# Patient Record
Sex: Female | Born: 1990 | Race: Black or African American | Hispanic: No | Marital: Single | Smoking: Never smoker
Health system: Southern US, Community
[De-identification: ages and names within clinical notes are randomized; demographics above are authoritative.]

## PROBLEM LIST (undated history)

## (undated) DIAGNOSIS — D649 Anemia, unspecified: Secondary | ICD-10-CM

## (undated) DIAGNOSIS — Z8619 Personal history of other infectious and parasitic diseases: Secondary | ICD-10-CM

## (undated) DIAGNOSIS — B379 Candidiasis, unspecified: Secondary | ICD-10-CM

## (undated) DIAGNOSIS — I456 Pre-excitation syndrome: Secondary | ICD-10-CM

## (undated) DIAGNOSIS — R638 Other symptoms and signs concerning food and fluid intake: Secondary | ICD-10-CM

## (undated) HISTORY — PX: TONSILLECTOMY: SUR1361

## (undated) HISTORY — DX: Other symptoms and signs concerning food and fluid intake: R63.8

## (undated) HISTORY — DX: Personal history of other infectious and parasitic diseases: Z86.19

## (undated) HISTORY — PX: MYRINGOTOMY: SUR874

## (undated) HISTORY — PX: CARDIAC ELECTROPHYSIOLOGY STUDY AND ABLATION: SHX1294

## (undated) HISTORY — DX: Anemia, unspecified: D64.9

## (undated) HISTORY — DX: Candidiasis, unspecified: B37.9

---
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
## 2012-02-24 ENCOUNTER — Encounter: Payer: Self-pay | Admitting: Obstetrics and Gynecology

## 2012-03-09 ENCOUNTER — Ambulatory Visit (INDEPENDENT_AMBULATORY_CARE_PROVIDER_SITE_OTHER): Payer: BC Managed Care – PPO | Admitting: Obstetrics and Gynecology

## 2012-03-09 ENCOUNTER — Encounter: Payer: Self-pay | Admitting: Obstetrics and Gynecology

## 2012-03-09 VITALS — BP 132/70 | Temp 98.6°F | Wt 205.0 lb

## 2012-03-09 DIAGNOSIS — R82998 Other abnormal findings in urine: Secondary | ICD-10-CM

## 2012-03-09 DIAGNOSIS — Z2089 Contact with and (suspected) exposure to other communicable diseases: Secondary | ICD-10-CM

## 2012-03-09 DIAGNOSIS — R8290 Unspecified abnormal findings in urine: Secondary | ICD-10-CM

## 2012-03-09 DIAGNOSIS — N906 Unspecified hypertrophy of vulva: Secondary | ICD-10-CM

## 2012-03-09 DIAGNOSIS — N946 Dysmenorrhea, unspecified: Secondary | ICD-10-CM

## 2012-03-09 DIAGNOSIS — Z202 Contact with and (suspected) exposure to infections with a predominantly sexual mode of transmission: Secondary | ICD-10-CM

## 2012-03-09 DIAGNOSIS — N926 Irregular menstruation, unspecified: Secondary | ICD-10-CM

## 2012-03-09 LAB — POCT URINALYSIS DIPSTICK
Bilirubin, UA: NEGATIVE
Glucose, UA: NEGATIVE
Ketones, UA: NEGATIVE
Leukocytes, UA: NEGATIVE
Nitrite, UA: NEGATIVE
Protein, UA: NEGATIVE
Spec Grav, UA: <=1.005
Urobilinogen, UA: NEGATIVE
pH, UA: 7

## 2012-03-09 NOTE — Progress Notes (Signed)
GYN PROBLEM VISIT  [name] is a 21 y.o. year old female,G0P0000, who presents for a problem visit. Historians are pt and her mother.  Pt seems to have difficulty remembering some facts. 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 Pt has noticed,"these extra folds of skin since teen year". No irritation or pain at the area. Pt c/o "vaginal bumps" that come and go; occ accompanied by pain. No fever.  Has difficulty cleaning after urination with toilet tissue getting stuck in the folds.  She 's concerned that she is unable to keep the vulvar area clean with menses.  The mother does complain of an offensive odor after the patient has urinated in the bathroom.  The patient denies dysuria, urgency, or urinary frequency. Mother complains that patient's menses are heavy and difficult to maage .  The patient states that she has regular monthly menses that last for as much as 2 weeks, however on further questioning her menses actually last about 5 days.  She does report soiling close and sheets.  She denies sexual activity or need for contraception.    Objective:  BP 132/70  Temp 98.6 F (37 C)  Wt 205 lb (92.987 kg)  LMP 02/24/2012   Abdomen: protruding with Exam  limited by body habitus.  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  Rule out urinary tract infection Labial hypertrophy causing difficulty with hygeine Menorrhagia  Dysmenorrhea  Plan: Urine culture and sensitivity Patient request surgery for reduction of labial hypertrophy.  The indications, risks, and benefits of this procedure were reviewed.  The risks of anesthesia bleeding infection and damage to adjacent organs is reviewed.  A  specific risk of vulvar nerve damage causing chronic vulvar pain is reviewed.  It is recommended that the patient be out of school for 2 weeks for day surgery and she will plan to have during her winter break.  She will call back with those dates so that it can be scheduled. Options for management of menorrhagia with contraception were reviewed and written information given.  The patient will review that information and call with her choice of method.   Return to office prn if symptoms worsen or fail to improve.   Dierdre Forth, MD  03/09/2012 6:27 PM

## 2012-03-09 NOTE — Patient Instructions (Signed)
Contraception Choices can also help make your periods more regular and lighter Birth control (contraception) can stop pregnancy from happening. Different types of birth control work in different ways. Some can:  Make the mucus in the cervix thick. This makes it hard for sperm to get into the uterus.   Thin the lining of the uterus. This makes it hard for an egg to attach to the wall of the uterus.   Stop the ovaries from releasing an egg.   Block the sperm from reaching the egg.  Certain types of surgery can stop pregnancy from happening. For women, the sugery closes the fallopian tubes (tubal ligation). For men, the surgery stops sperm from releasing during sex (vasectomy). HORMONAL BIRTH CONTROL Hormonal birth control stops pregnancy by putting hormones into your body. Types of birth control include:  A small tube put under the skin of the upper arm (implant). The tube can stay in place for 3 years.   Shots given every 3 months.   Pills taken every day or once after sex (intercourse).   Patches that are changed once a week.   A ring put into the vagina (vaginal ring). The ring is left in place for 3 weeks and removed for 1 week. Then, a new ring is put in the vagina.  BARRIER BIRTH CONTROL   (this will not help your period be lighter or more regular) Barrier birth control blocks sperm from reaching the egg. Types of birth control include:   A thin covering worn on the penis (female condom) during sex.   A soft, loose covering put into the vagina (female condom) before sex.   A rubber bowl that sits over the cervix (diaphragm). The bowl must be made for you. The bowl is put into the vagina before sex. The bowl is left in place for 6 to 8 hours after sex.   A small, soft cup that fits over the cervix (cervical cap). The cup must be made for you. The cup can be left in place for 48 hours after sex.   A sponge that is put into the vagina before sex.   A chemical that kills or blocks  sperm from getting into the cervix and uterus (spermicide). The chemical may be a cream, jelly, foam, or pill.  INTRAUTERINE (IUD) BIRTH CONTROL  IUD birth control is a small, T-shaped piece of plastic. The plastic is put inside the uterus. There are 2 types of IUD:  Copper IUD. The IUD is covered in copper wire. The copper makes a fluid that kills sperm. It can stay in place for 10 years.   Hormone IUD. The hormone stops pregnancy from happening. It can stay in place for 5 years.  Document Released: 04/25/2009 Document Revised: 06/17/2011 Document Reviewed: 11/04/2010 [facility] Patient Information 2012 [place], [place].

## 2012-03-12 LAB — URINE CULTURE: Colony Count: 30000

## 2012-04-27 ENCOUNTER — Telehealth: Payer: Self-pay | Admitting: Obstetrics and Gynecology

## 2012-09-18 ENCOUNTER — Telehealth: Payer: Self-pay | Admitting: Obstetrics and Gynecology

## 2012-09-18 NOTE — Telephone Encounter (Signed)
TC to patient to follow up on an old order for surgery, LM for patient to [facility] to [phone]. -ap

## 2012-09-20 ENCOUNTER — Telehealth: Payer: Self-pay | Admitting: Obstetrics and Gynecology

## 2012-09-20 NOTE — Telephone Encounter (Signed)
RC from patient - States she is not ready to proceed with outpatient surgery.  She will call back when ready. -ap

## 2015-09-04 ENCOUNTER — Ambulatory Visit: Payer: Self-pay | Admitting: Gynecology

## 2015-09-15 ENCOUNTER — Ambulatory Visit: Payer: Self-pay | Admitting: Gynecology

## 2015-10-02 ENCOUNTER — Ambulatory Visit (INDEPENDENT_AMBULATORY_CARE_PROVIDER_SITE_OTHER): Payer: PRIVATE HEALTH INSURANCE | Admitting: Gynecology

## 2015-10-02 ENCOUNTER — Encounter: Payer: Self-pay | Admitting: Gynecology

## 2015-10-02 VITALS — BP 122/76 | Ht 66.0 in | Wt 206.0 lb

## 2015-10-02 DIAGNOSIS — IMO0002 Reserved for concepts with insufficient information to code with codable children: Secondary | ICD-10-CM

## 2015-10-02 DIAGNOSIS — R87810 Cervical high risk human papillomavirus (HPV) DNA test positive: Secondary | ICD-10-CM

## 2015-10-02 DIAGNOSIS — R896 Abnormal cytological findings in specimens from other organs, systems and tissues: Secondary | ICD-10-CM

## 2015-10-02 NOTE — Patient Instructions (Signed)
Follow up for repeat Pap smear when you're due for your next annual exam.

## 2015-10-02 NOTE — Progress Notes (Signed)
    Jeanell Sparrowavia N Kester 02/25/1991 [id number]        25 y.o.  G0P0000 new patient presents in [facility]. 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  Past medical history,surgical history, problem list, medications, allergies, family history and social history were all reviewed and documented in the EPIC chart.  Directed ROS with pertinent positives and negatives documented in the history of present illness/assessment and plan.  Exam: Kennon PortelaKim Gardner assistant Filed Vitals:   10/02/15 1401  BP: 122/76  Height: 5\' 6"  (1.676 m)  Weight: 206 lb (93.441 kg)   General appearance:  Normal Abdomen soft nontender without masses guarding rebound Pelvic external BUS vagina grossly normal with prominent minora bilaterally as always. Cervix grossly normal. Uterus normal size midline mobile nontender. Adnexa without masses or tenderness.  Colposcopy performed after acetic acid cleanse adequate with ectropion and no abnormalities noted. No biopsies taken. Physical Exam  Genitourinary:       Assessment/Plan:  25 y.o. 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 Colposcopy is adequate normal noting ectropion and clear easy visualization of the transformation zone. No biopsies taken. I reviewed with the patient and her mother the whole issue of dysplasia, high-grade/low-grade, progression/regression and the HPV association. Recommend expectant management for now with repeat Pap smear/HPV in 1 year. [facility]    Dara LordsFONTAINE,Golden Emile P MD, 2:37 PM 10/02/2015

## 2015-12-11 ENCOUNTER — Ambulatory Visit (INDEPENDENT_AMBULATORY_CARE_PROVIDER_SITE_OTHER): Payer: PRIVATE HEALTH INSURANCE | Admitting: Sports Medicine

## 2015-12-11 ENCOUNTER — Encounter: Payer: Self-pay | Admitting: Sports Medicine

## 2015-12-11 DIAGNOSIS — M79672 Pain in left foot: Secondary | ICD-10-CM | POA: Diagnosis not present

## 2015-12-11 DIAGNOSIS — Q828 Other specified congenital malformations of skin: Secondary | ICD-10-CM | POA: Diagnosis not present

## 2015-12-11 DIAGNOSIS — M2042 Other hammer toe(s) (acquired), left foot: Secondary | ICD-10-CM | POA: Diagnosis not present

## 2015-12-11 NOTE — Progress Notes (Signed)
Patient ID: Allison Jones, female   DOB: 1990-09-19, 25 y.o.   MRN: [id number] Subjective: Allison Jones is a 25 y.o. female patient who presents to office for evaluation of Left foot pain secondary to callus skin; thinks is a possible wart. Patient complains of pain at the lesion present Left foot at the ball for over one month. Patient has tried aspirin with no relief in symptoms. Patient denies any other pedal complaints.   Patient Active Problem List   Diagnosis Date Noted  . Dysmenorrhea 03/09/2012  . Irregular menses 03/09/2012  . Abnormal urine odor 03/09/2012  . 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 IRON PO Take by mouth.    Marland Kitchen PRESCRIPTION MEDICATION Birth control pill  ?name     No current facility-administered medications on file prior to visit.    No Known Allergies  Objective:  General: Alert and oriented x3 in no acute distress  Dermatology: Keratotic lesion present Sub-met 2, left foot with skin lines transversing the lesion, pain is present with direct pressure to the lesion with a central nucleated core noted consistent with porokeratosis, no webspace macerations, no ecchymosis bilateral, all nails x 10 are well manicured.  Vascular: Dorsalis Pedis and Posterior Tibial pedal pulses 2/4, Capillary Fill Time 3 seconds, + pedal hair growth bilateral, no edema bilateral lower extremities, Temperature gradient within normal limits.  Neurology: Johney Maine sensation intact via light touch bilateral.  Musculoskeletal: Mild tenderness with palpation at the keratotic lesion site on Left, Muscular strength 5/5 in all groups without pain or limitation on range of motion. Significant hammertoe deformity 1 through 5, left greater than right, rigid in nature with plantarflex metatarsal head.  Assessment and Plan: Problem List Items Addressed This Visit    None    Visit Diagnoses    Porokeratosis    -  Primary    Hammer toe of left foot        Left foot pain          -Complete examination performed -Discussed treatment options for porokeratosis, likely secondary to digital deformity; encouraged patient to consider surgical correction of digital deformities after possible neurologic workup. Patient has a past history of toe walking that is concerning for neurological component of hammertoe. -Parred keratoic lesion using a chisel blade; treated the area with Salinocaine covered with moleskin -Gave offloading pads for use as needed to ball of left foot -Encouraged daily skin emollients -Encouraged use of pumice stone -Advised good supportive shoes and inserts -Patient to return to office as needed or sooner if condition worsens.  Allison Jones, DPM

## 2016-01-08 ENCOUNTER — Ambulatory Visit (INDEPENDENT_AMBULATORY_CARE_PROVIDER_SITE_OTHER): Payer: PRIVATE HEALTH INSURANCE | Admitting: Sports Medicine

## 2016-01-08 ENCOUNTER — Encounter: Payer: Self-pay | Admitting: Sports Medicine

## 2016-01-08 DIAGNOSIS — Q828 Other specified congenital malformations of skin: Secondary | ICD-10-CM | POA: Diagnosis not present

## 2016-01-08 DIAGNOSIS — M79672 Pain in left foot: Secondary | ICD-10-CM

## 2016-01-08 DIAGNOSIS — M2042 Other hammer toe(s) (acquired), left foot: Secondary | ICD-10-CM | POA: Diagnosis not present

## 2016-01-08 DIAGNOSIS — B359 Dermatophytosis, unspecified: Secondary | ICD-10-CM

## 2016-01-08 MED ORDER — CLOTRIMAZOLE 1 % EX SOLN: 1.0000 "application " | Freq: Two times a day (BID) | CUTANEOUS | Status: DC

## 2016-01-08 NOTE — Progress Notes (Signed)
Patient ID: Allison Jones, female   DOB: August 14, 1990, 25 y.o.   MRN: [id number]  Subjective: Allison Jones is a 25 y.o. female patient who returns to office for evaluation of Left foot pain secondary to callus skin. Patient complains of pain at the lesion present Left foot at the ball; reports trim last visit only helped for a few days. Patient denies any other pedal complaints.   Patient Active Problem List   Diagnosis Date Noted  . Dysmenorrhea 03/09/2012  . Irregular menses 03/09/2012  . Abnormal urine odor 03/09/2012  . 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 IRON PO Take by mouth.    Marland Kitchen PRESCRIPTION MEDICATION Birth control pill  ?name     No current facility-administered medications on file prior to visit.    No Known Allergies  Objective:  General: Alert and oriented x3 in no acute distress  Dermatology: Keratotic lesion present left 5th toe and Sub-met 2, left foot with skin lines transversing the lesion, pain is present with direct pressure to the lesion with a central nucleated core noted consistent with porokeratosis, + left webspace macerations, no ecchymosis bilateral, all nails x 10 are well manicured.  Vascular: Dorsalis Pedis and Posterior Tibial pedal pulses 2/4, Capillary Fill Time 3 seconds, + pedal hair growth bilateral, no edema bilateral lower extremities, Temperature gradient within normal limits.  Neurology: Johney Maine sensation intact via light touch bilateral.  Musculoskeletal: Mild tenderness with palpation at the keratotic lesion sites on Left, Muscular strength 5/5 in all groups without pain or limitation on range of motion. Significant hammertoe deformity 1 through 5, left greater than right, rigid in nature with plantarflex metatarsal head.  Assessment and Plan: Problem List Items Addressed This Visit    None    Visit Diagnoses    Porokeratosis    -  Primary    Hammer toe of left foot        Left foot pain        Tinea        Relevant Medications    clotrimazole (LOTRIMIN) 1 % external solution      -Complete examination performed -Discussed treatment options for porokeratosis, likely secondary to digital deformity; encouraged patient to consider surgical correction of digital deformities possible extensor subsitution  -Parred keratoic lesions x2 using a chisel blade; treated the area sub met 2 on left with Salinocaine covered with moleskin and gave silicone pad for left 5th toe -Encouraged daily skin emollients -Encouraged use of pumice stone -Advised good supportive shoes and inserts -Rx Lotrimin sol to use in between toes once this clears up can re-discuss surgery options with likely need for 2 months time off from work -Patient to return to office as needed or sooner if condition worsens.  Allison Jones, DPM

## 2016-02-05 ENCOUNTER — Encounter: Payer: Self-pay | Admitting: Sports Medicine

## 2016-02-05 ENCOUNTER — Ambulatory Visit (INDEPENDENT_AMBULATORY_CARE_PROVIDER_SITE_OTHER): Payer: PRIVATE HEALTH INSURANCE | Admitting: Sports Medicine

## 2016-02-05 DIAGNOSIS — Q828 Other specified congenital malformations of skin: Secondary | ICD-10-CM | POA: Diagnosis not present

## 2016-02-05 DIAGNOSIS — B359 Dermatophytosis, unspecified: Secondary | ICD-10-CM

## 2016-02-05 DIAGNOSIS — M79672 Pain in left foot: Secondary | ICD-10-CM

## 2016-02-05 DIAGNOSIS — M2042 Other hammer toe(s) (acquired), left foot: Secondary | ICD-10-CM

## 2016-02-05 NOTE — Progress Notes (Signed)
Patient ID: Allison Jones, female   DOB: 27-Dec-1990, 25 y.o.   MRN: [id number]  Subjective: Allison Jones is a 25 y.o. female patient who returns to office for evaluation of Left foot pain secondary to callus skin. Patient complains of pain at the lesion present Left foot at the ball; reports that the callus area hurts especially when she walks barefoot. Patient denies any other pedal complaints.   Patient Active Problem List   Diagnosis Date Noted  . Dysmenorrhea 03/09/2012  . Irregular menses 03/09/2012  . Abnormal urine odor 03/09/2012  . 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 clotrimazole (LOTRIMIN) 1 % external solution Apply 1 application topically 2 (two) times daily. In between toes 60 mL 3  . IRON PO Take by mouth.    Marland Kitchen PRESCRIPTION MEDICATION Birth control pill  ?name     No current facility-administered medications on file prior to visit.     No Known Allergies  Objective:  General: Alert and oriented x3 in no acute distress  Dermatology: Keratotic lesion present left 5th toe and Sub-met 2-3, left foot with skin lines transversing the lesion, pain is present with direct pressure to the lesion with a central nucleated core noted consistent with porokeratosis, + left webspace macerations improved in nature, no ecchymosis bilateral, all nails x 10 are well manicured.  Vascular: Dorsalis Pedis and Posterior Tibial pedal pulses 2/4, Capillary Fill Time 3 seconds, + pedal hair growth bilateral, no edema bilateral lower extremities, Temperature gradient within normal limits.  Neurology: Johney Maine sensation intact via light touch bilateral.  Musculoskeletal: Mild tenderness with palpation at the keratotic lesion sites on Left, Muscular strength 5/5 in all groups without pain or limitation on range of motion. Significant hammertoe deformity 1 through 5, left greater than right, rigid in nature  with plantarflex metatarsal head.  Assessment and Plan: Problem List Items Addressed This Visit    None    Visit Diagnoses    Porokeratosis    -  Primary   Hammer toe of left foot       Left foot pain       Tinea         -Complete examination performed -Discussed treatment options for porokeratosis, likely secondary to digital deformity; encouraged patient to consider surgical correction of digital deformities possible extensor subsitution  -Parred keratoic lesions x2 using a chisel blade; treated the area sub met 2-3 on left with Salinocaine covered with moleskin -Continue with silicone pad for left 5th toeAs tolerated -Encouraged daily skin emollients -Encouraged use of pumice stone -Advised good supportive shoes and inserts -Continue with Lotrimin sol to use in between toes until finished  -Re-discuss surgery options with likely need for 2 months time off from work; Patient states that she will think about it and most likely will consider surgery 2 months from now. -Patient to return to office as needed or sooner if condition worsens.  Landis Martins, DPM

## 2016-02-10 ENCOUNTER — Encounter: Payer: Self-pay | Admitting: Gynecology

## 2016-02-10 ENCOUNTER — Ambulatory Visit (INDEPENDENT_AMBULATORY_CARE_PROVIDER_SITE_OTHER): Payer: PRIVATE HEALTH INSURANCE | Admitting: Gynecology

## 2016-02-10 VITALS — BP 116/74

## 2016-02-10 DIAGNOSIS — N926 Irregular menstruation, unspecified: Secondary | ICD-10-CM | POA: Diagnosis not present

## 2016-02-10 LAB — CBC WITH DIFFERENTIAL/PLATELET
Basophils Absolute: 0 cells/uL (ref 0–200)
Basophils Relative: 0 %
Eosinophils Absolute: 132 cells/uL (ref 15–500)
Eosinophils Relative: 2 %
HCT: 43.9 % (ref 35.0–45.0)
Hemoglobin: 14.9 g/dL (ref 11.7–15.5)
Lymphocytes Relative: 47 %
Lymphs Abs: 3102 cells/uL (ref 850–3900)
MCH: 28.9 pg (ref 27.0–33.0)
MCHC: 33.9 g/dL (ref 32.0–36.0)
MCV: 85.2 fL (ref 80.0–100.0)
MPV: 9.4 fL (ref 7.5–12.5)
Monocytes Absolute: 660 cells/uL (ref 200–950)
Monocytes Relative: 10 %
Neutro Abs: 2706 cells/uL (ref 1500–7800)
Neutrophils Relative %: 41 %
Platelets: 292 10*3/uL (ref 140–400)
RBC: 5.15 MIL/uL — ABNORMAL HIGH (ref 3.80–5.10)
RDW: 14.3 % (ref 11.0–15.0)
WBC: 6.6 10*3/uL (ref 3.8–10.8)

## 2016-02-10 MED ORDER — NORETHINDRONE ACET-ETHINYL EST 1-20 MG-MCG PO TABS
1.0000 | ORAL_TABLET | Freq: Every day | ORAL | 6 refills | Status: DC
Start: 2016-02-10 — End: 2018-01-25

## 2016-02-10 NOTE — Patient Instructions (Signed)
Start on the birth control pills as we discussed. Call me if your irregular bleeding continues.

## 2016-02-10 NOTE — Progress Notes (Signed)
    Allison Jones [birth year] [id number]        25 y.o.  G0P0000 presents complaining of irregular bleeding 2 months. Patient notes regular monthly menses proceeding and then for the last 2 months she has bled almost on a weekly basis. Never heavy but spotting on and off to a light flow. No other changes such as weight changes, hair, skin. Not currently sexually active with last reported intercourse October 2016. Not using hormonal birth control although has listed Micronor in the past. Not being followed for any medical issues. Never smoked  Past medical history,surgical history, problem list, medications, allergies, family history and social history were all reviewed and documented in the EPIC chart.  Directed ROS with pertinent positives and negatives documented in the history of present illness/assessment and plan.  Exam: Kennon Portela assistant Vitals:   02/10/16 1507  BP: 116/74   General appearance:  Normal Abdomen soft nontender without masses guarding rebound Pelvic external BUS vagina normal. No bleeding noted. Cervix normal. Uterus normal size midline mobile nontender. Adnexa without masses or tenderness.  Assessment/Plan:  25 y.o. G0P0000 with dysfunctional bleeding. Exam is grossly normal. Check baseline labs to include CBC, TSH, prolactin, hCG. No signs or symptoms to suggest early menopause. Options for management reviewed. Both agree to start low-dose oral contraceptives for menstrual regulation and birth control why she doesn't dissipate becoming sexually active this coming year. If she would continue to have irregular bleeding after starting the pills times one or 2 months then will proceed with ultrasound evaluation. Otherwise assuming they regulate her then she'll continue on the oral contraceptives and follow up when she is due for her routine exam.    [name] MD, 3:29 PM 02/10/2016

## 2016-02-11 LAB — PROLACTIN: Prolactin: 8 ng/mL

## 2016-02-11 LAB — TSH: TSH: 1.29 mIU/L

## 2016-02-11 LAB — HCG, SERUM, QUALITATIVE: Preg, Serum: NEGATIVE

## 2016-04-27 ENCOUNTER — Telehealth: Payer: Self-pay | Admitting: *Deleted

## 2016-04-27 DIAGNOSIS — N926 Irregular menstruation, unspecified: Secondary | ICD-10-CM

## 2016-04-27 NOTE — Telephone Encounter (Signed)
Per note on 02/10/16 "If she would continue to have irregular bleeding after starting the pills times one or 2 months then will proceed with ultrasound evaluation"  Dr.Fontaine I just want confirm you want vaginal ultrasound, not SHGM?  Please advise

## 2016-04-27 NOTE — Telephone Encounter (Signed)
Front desk to schedule.

## 2016-04-27 NOTE — Telephone Encounter (Signed)
Sonohysterogram 

## 2016-04-29 ENCOUNTER — Telehealth: Payer: Self-pay | Admitting: *Deleted

## 2016-04-29 NOTE — Telephone Encounter (Signed)
[facility]  [facility] at $25 copay. All codes given.  Pt will be called and scheduled KW CMA

## 2016-05-17 ENCOUNTER — Other Ambulatory Visit: Payer: Self-pay | Admitting: Gynecology

## 2016-05-17 DIAGNOSIS — N939 Abnormal uterine and vaginal bleeding, unspecified: Secondary | ICD-10-CM

## 2016-05-27 ENCOUNTER — Ambulatory Visit (INDEPENDENT_AMBULATORY_CARE_PROVIDER_SITE_OTHER): Payer: PRIVATE HEALTH INSURANCE | Admitting: Gynecology

## 2016-05-27 ENCOUNTER — Telehealth: Payer: Self-pay | Admitting: *Deleted

## 2016-05-27 ENCOUNTER — Ambulatory Visit (INDEPENDENT_AMBULATORY_CARE_PROVIDER_SITE_OTHER): Payer: PRIVATE HEALTH INSURANCE

## 2016-05-27 ENCOUNTER — Encounter: Payer: Self-pay | Admitting: Gynecology

## 2016-05-27 VITALS — BP 118/76

## 2016-05-27 DIAGNOSIS — N926 Irregular menstruation, unspecified: Secondary | ICD-10-CM

## 2016-05-27 DIAGNOSIS — N939 Abnormal uterine and vaginal bleeding, unspecified: Secondary | ICD-10-CM

## 2016-05-27 DIAGNOSIS — N912 Amenorrhea, unspecified: Secondary | ICD-10-CM

## 2016-05-27 LAB — PREGNANCY, URINE: Preg Test, Ur: NEGATIVE

## 2016-05-27 MED ORDER — NORETHINDRONE-ETH ESTRADIOL 1-35 MG-MCG PO TABS: 1.0000 | ORAL_TABLET | Freq: Every day | ORAL | 5 refills | Status: DC

## 2016-05-27 NOTE — Progress Notes (Signed)
    Allison Jones Nov 26, 1990 [id number]        25 y.o.  G0P0000 presents for sonohysterogram. History of regular monthly menses until this summer when she started bleeding irregularly. Exam was normal with a negative hCG although patient was not sexually active at that time normal prolactin and TSH. Was placed on 1/20 oral contraceptives and has continued to bleed on and off with irregular spotting. Also has become sexually active and notes postcoital spotting. No pain or discomfort with the bleeding area  Past medical history,surgical history, problem list, medications, allergies, family history and social history were all reviewed and documented in the EPIC chart.  Directed ROS with pertinent positives and negatives documented in the history of present illness/assessment and plan.  Exam: [place] assistant BP 118/76 General appearance:  Normal Abdomen soft nontender without masses guarding rebound Pelvic external BUS vagina normal. Cervix normal. GC/Chlamydia done. Uterus normal size midline mobile nontender. Adnexa without masses or tenderness.  Ultrasound:  Transvaginal and transabdominal shows uterus to be normal size and echotexture. Endometrial echo 5.2 mm. Right ovary normal. Left ovary with hypoechoic area 13 x 10 x 12 mm. Negative flow consistent with a physiologic change. Cul-de-sac negative  Sonohysterogram performed, sterile technique, easy catheter introduction, good distention with no abnormalities. Endometrial sample taken. Patient tolerated well.  Assessment/Plan:  25 y.o. G0P0000 with continued curator spotting and postcoital spotting after initiation of low-dose oral contraceptives. Hormone workup negative. UPT today was negative. Sonohysterogram without endometrial defects. Patient will follow up for biopsy results. Follow up on GC/chlamydia screen. Increase dose of pills to hopefully control bleeding and go to a 35 g pill. Ortho-Novum 1/35 equivalent prescribed. Follow  up if irregular bleeding continues.  Patient is due for follow up Pap smear this coming March/April with history of ASCUS positive high-risk HPV with normal colposcopy. Patient reminded to follow up for this    Dara LordsFONTAINE,Andras Grunewald P MD, 4:00 PM 05/27/2016

## 2016-05-27 NOTE — Telephone Encounter (Signed)
[facility] states she is having light spotting asked okay to keep appointment. I advised pt to keep schedule appointment.

## 2016-05-27 NOTE — Patient Instructions (Signed)
Start on the new her birth control pill. Call if you still have irregular bleeding after 2-3 months.  Office will call you with the biopsy results from the ultrasound  You are due for follow up exam and Pap smear in March/April 2018.

## 2016-05-28 LAB — GC/CHLAMYDIA PROBE AMP
CT Probe RNA: NOT DETECTED
GC Probe RNA: NOT DETECTED

## 2016-10-14 ENCOUNTER — Encounter: Payer: PRIVATE HEALTH INSURANCE | Admitting: Gynecology

## 2016-10-19 ENCOUNTER — Ambulatory Visit (INDEPENDENT_AMBULATORY_CARE_PROVIDER_SITE_OTHER): Payer: PRIVATE HEALTH INSURANCE | Admitting: Gynecology

## 2016-10-19 ENCOUNTER — Encounter: Payer: Self-pay | Admitting: Gynecology

## 2016-10-19 VITALS — BP 144/86 | Ht 65.0 in | Wt 197.0 lb

## 2016-10-19 DIAGNOSIS — R87810 Cervical high risk human papillomavirus (HPV) DNA test positive: Secondary | ICD-10-CM | POA: Diagnosis not present

## 2016-10-19 DIAGNOSIS — N760 Acute vaginitis: Secondary | ICD-10-CM

## 2016-10-19 DIAGNOSIS — B9689 Other specified bacterial agents as the cause of diseases classified elsewhere: Secondary | ICD-10-CM | POA: Diagnosis not present

## 2016-10-19 DIAGNOSIS — N898 Other specified noninflammatory disorders of vagina: Secondary | ICD-10-CM | POA: Diagnosis not present

## 2016-10-19 DIAGNOSIS — Z01419 Encounter for gynecological examination (general) (routine) without abnormal findings: Secondary | ICD-10-CM | POA: Diagnosis not present

## 2016-10-19 DIAGNOSIS — Z113 Encounter for screening for infections with a predominantly sexual mode of transmission: Secondary | ICD-10-CM | POA: Diagnosis not present

## 2016-10-19 DIAGNOSIS — R6882 Decreased libido: Secondary | ICD-10-CM

## 2016-10-19 DIAGNOSIS — I10 Essential (primary) hypertension: Secondary | ICD-10-CM | POA: Diagnosis not present

## 2016-10-19 LAB — WET PREP FOR TRICH, YEAST, CLUE
Trich, Wet Prep: NONE SEEN
Yeast Wet Prep HPF POC: NONE SEEN

## 2016-10-19 LAB — CBC WITH DIFFERENTIAL/PLATELET
Basophils Absolute: 0 cells/uL (ref 0–200)
Basophils Relative: 0 %
Eosinophils Absolute: 67 cells/uL (ref 15–500)
Eosinophils Relative: 1 %
HCT: 43.7 % (ref 35.0–45.0)
Hemoglobin: 14.8 g/dL (ref 11.7–15.5)
Lymphocytes Relative: 47 %
Lymphs Abs: 3149 cells/uL (ref 850–3900)
MCH: 28.7 pg (ref 27.0–33.0)
MCHC: 33.9 g/dL (ref 32.0–36.0)
MCV: 84.9 fL (ref 80.0–100.0)
MPV: 9 fL (ref 7.5–12.5)
Monocytes Absolute: 469 cells/uL (ref 200–950)
Monocytes Relative: 7 %
Neutro Abs: 3015 cells/uL (ref 1500–7800)
Neutrophils Relative %: 45 %
Platelets: 307 10*3/uL (ref 140–400)
RBC: 5.15 MIL/uL — ABNORMAL HIGH (ref 3.80–5.10)
RDW: 13.4 % (ref 11.0–15.0)
WBC: 6.7 10*3/uL (ref 3.8–10.8)

## 2016-10-19 MED ORDER — METRONIDAZOLE 500 MG PO TABS: 500.0000 mg | ORAL_TABLET | Freq: Two times a day (BID) | ORAL | 0 refills | Status: DC

## 2016-10-19 NOTE — Progress Notes (Signed)
    Allison Jones [birth year] [id number]        26 y.o.  G0P0000 for annual exam.  Also complaining of vaginal odor over the last several weeks. No real discharge or irritation. No urinary symptoms such as frequency dysuria or urgency, no low back pain. Patient notes decreased libido and wonders whether it's related to the birth control pills. No pain with intercourse. Just a decreased desire. Noticed it started after starting the birth control pills. She is otherwise doing well on with regular menses. Lastly she requests STD screening. No known exposure but would like to be screened.  Past medical history,surgical history, problem list, medications, allergies, family history and social history were all reviewed and documented as reviewed in the EPIC chart.  ROS:  Performed with pertinent positives and negatives included in the history, assessment and plan.   Additional significant findings :  None   Exam: Kennon Portela assistant Vitals:   10/19/16 1355  BP: (!) 144/86  Weight: 197 lb (89.4 kg)  Height:  (1.651 m)   Body mass index is 32.78 kg/m.  General appearance:  Normal affect, orientation and appearance. Skin: Grossly normal HEENT: Without gross lesions.  No cervical or supraclavicular adenopathy. Thyroid normal.  Lungs:  Clear without wheezing, rales or rhonchi Cardiac: RR, without RMG Abdominal:  Soft, nontender, without masses, guarding, rebound, organomegaly or hernia Breasts:  Examined lying and sitting without masses, retractions, discharge or axillary adenopathy. Pelvic:  Ext, BUS, Vagina: With slight white discharge. Generous labia minora bilaterally as always  Cervix: Normal. GC/Chlamydia. Pap smear/HPV  Uterus: Anteverted, normal size, shape and contour, midline and mobile nontender   Adnexa: Without masses or tenderness    Anus and perineum: Normal    Assessment/Plan:  26 y.o. G0P0000 female for annual exam with regular menses, oral contraceptives.    1. History of ASCUS with positive HPV 2017. Normal Pap smear 2016. 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 Normal Pap smear 2014 with negative HPV. Colposcopy 2017 was adequate and normal. Pap smear/HPV today. 2. Decreased libido. I reviewed various possibilities with decreased libido to include ovarian androgen suppression with birth control pills. We discussed alternative contraception to include Nexplanon and IUDs. Patient would like to stop her birth control pills altogether and use condoms only. I reviewed the failure risk with this and the availability of Plan B. Patient will go ahead and stop her pills at her choice. Will follow up if she decides pursue alternative birth control. 3. STD screening requested. No known exposure. GC/Chlamydia, hepatitis B, hepatitis C, HIV, RPR ordered. 4. Vaginal odor. Wet prep consistent with bacterial vaginosis. Will treat with Flagyl 500 mg twice a day 7 days. Alcohol avoidance reviewed. 5. Gardasil vaccine reviewed and encouraged. Literature provided and she will follow up if she chooses to begin the series. 6. Health maintenance. Baseline CBC done today. Blood pressure 144/86. No history of hypertension in the past. Recommended having her blood pressure rechecked in a nonexam situation. If it would remain elevated she needs to follow up with her primary physician. The importance of follow up was stressed. Otherwise assuming she does well off the birth control pills and she'll see me in a year, sooner as needed.   [name] MD, 2:19 PM 10/19/2016

## 2016-10-19 NOTE — Addendum Note (Signed)
Addended by: Dayna Barker on: 10/19/2016 02:36 PM   Modules accepted: Orders

## 2016-10-19 NOTE — Patient Instructions (Signed)
Take the metronidazole antibiotic twice daily for 7 days. Avoid alcohol while taking.  Recheck your blood pressure and make sure that it's in the normal range around 120/70. It was 144/86 in the office which is too high. If it would remain elevated then you need to see your primary physician for further evaluation.  Follow up for the Gardasil vaccine if you choose to pursue this.  Follow up if you would like to use another form of birth control

## 2016-10-20 LAB — HIV ANTIBODY (ROUTINE TESTING W REFLEX): HIV 1&2 Ab, 4th Generation: NONREACTIVE

## 2016-10-20 LAB — HEPATITIS B SURFACE ANTIGEN: Hepatitis B Surface Ag: NEGATIVE

## 2016-10-20 LAB — GC/CHLAMYDIA PROBE AMP
CT Probe RNA: NOT DETECTED
GC Probe RNA: NOT DETECTED

## 2016-10-20 LAB — HEPATITIS C ANTIBODY: HCV Ab: NEGATIVE

## 2016-10-20 LAB — RPR

## 2016-10-22 LAB — PAP IG AND HPV HIGH-RISK: HPV DNA High Risk: NOT DETECTED

## 2017-03-29 DIAGNOSIS — R0789 Other chest pain: Secondary | ICD-10-CM

## 2017-05-09 DIAGNOSIS — R079 Chest pain, unspecified: Secondary | ICD-10-CM | POA: Diagnosis not present

## 2017-05-09 DIAGNOSIS — Z9889 Other specified postprocedural states: Secondary | ICD-10-CM | POA: Insufficient documentation

## 2017-05-09 DIAGNOSIS — Z8679 Personal history of other diseases of the circulatory system: Secondary | ICD-10-CM | POA: Insufficient documentation

## 2017-06-16 DIAGNOSIS — B373 Candidiasis of vulva and vagina: Secondary | ICD-10-CM | POA: Diagnosis not present

## 2017-06-16 DIAGNOSIS — Z23 Encounter for immunization: Secondary | ICD-10-CM | POA: Diagnosis not present

## 2017-06-30 DIAGNOSIS — Z6835 Body mass index (BMI) 35.0-35.9, adult: Secondary | ICD-10-CM | POA: Diagnosis not present

## 2017-06-30 DIAGNOSIS — L662 Folliculitis decalvans: Secondary | ICD-10-CM | POA: Diagnosis not present

## 2017-06-30 DIAGNOSIS — Z0000 Encounter for general adult medical examination without abnormal findings: Secondary | ICD-10-CM | POA: Diagnosis not present

## 2017-06-30 DIAGNOSIS — D6489 Other specified anemias: Secondary | ICD-10-CM | POA: Diagnosis not present

## 2017-08-02 DIAGNOSIS — R079 Chest pain, unspecified: Secondary | ICD-10-CM | POA: Diagnosis not present

## 2017-08-30 DIAGNOSIS — D509 Iron deficiency anemia, unspecified: Secondary | ICD-10-CM | POA: Diagnosis not present

## 2017-10-20 ENCOUNTER — Encounter: Payer: Self-pay | Admitting: Gynecology

## 2017-10-20 ENCOUNTER — Ambulatory Visit (INDEPENDENT_AMBULATORY_CARE_PROVIDER_SITE_OTHER): Payer: BLUE CROSS/BLUE SHIELD | Admitting: Gynecology

## 2017-10-20 ENCOUNTER — Encounter: Payer: PRIVATE HEALTH INSURANCE | Admitting: Gynecology

## 2017-10-20 VITALS — BP 118/76 | Ht 65.0 in | Wt 200.0 lb

## 2017-10-20 DIAGNOSIS — Z1151 Encounter for screening for human papillomavirus (HPV): Secondary | ICD-10-CM

## 2017-10-20 DIAGNOSIS — N898 Other specified noninflammatory disorders of vagina: Secondary | ICD-10-CM | POA: Diagnosis not present

## 2017-10-20 DIAGNOSIS — Z113 Encounter for screening for infections with a predominantly sexual mode of transmission: Secondary | ICD-10-CM

## 2017-10-20 DIAGNOSIS — Z01411 Encounter for gynecological examination (general) (routine) with abnormal findings: Secondary | ICD-10-CM | POA: Diagnosis not present

## 2017-10-20 LAB — WET PREP FOR TRICH, YEAST, CLUE

## 2017-10-20 MED ORDER — METRONIDAZOLE 500 MG PO TABS: 500.0000 mg | ORAL_TABLET | Freq: Two times a day (BID) | ORAL | 0 refills | Status: DC

## 2017-10-20 NOTE — Patient Instructions (Signed)
Take the metronidazole oral antibiotic twice daily for 7 days.  Avoid alcohol while taking.  Call if you have a recurrence of your vaginal symptoms.

## 2017-10-20 NOTE — Addendum Note (Signed)
Addended by: Dayna BarkerGARDNER, Darra Rosa K on: 10/20/2017 03:06 PM   Modules accepted: Orders

## 2017-10-20 NOTE — Progress Notes (Signed)
Allison Jones Mar 17, 1991 [id number]        27 y.o.  G0P0000 for annual gynecologic exam.  She also has 2 other issues:  1. Vaginal odor with vaginal discharge over the last month or so.  No real itching or irritation.  No urinary symptoms such as frequency dysuria urgency low back pain fever or chills.  History of bacterial vaginosis in the past.  Notes that her symptoms previously totally resolved with treatment but now have recurred. 2. Questions whether her labia are normal in appearance.  They are larger and she was concerned about this.  Past medical history,surgical history, problem list, medications, allergies, family history and social history were all reviewed and documented as reviewed in the EPIC chart.  ROS:  Performed with pertinent positives and negatives included in the history, assessment and plan.   Additional significant findings : None   Exam: Allison PortelaKim Jones assistant Vitals:   10/20/17 1427  BP: 118/76  Weight: 200 lb (90.7 kg)  Height: 5\' 5"  (1.651 m)   Body mass index is 33.28 kg/m.  General appearance:  Normal affect, orientation and appearance. Skin: Grossly normal HEENT: Without gross lesions.  No cervical or supraclavicular adenopathy. Thyroid normal.  Lungs:  Clear without wheezing, rales or rhonchi Cardiac: RR, without RMG Abdominal:  Soft, nontender, without masses, guarding, rebound, organomegaly or hernia Breasts:  Examined lying and sitting without masses, retractions, discharge or axillary adenopathy. Pelvic:  Ext, BUS, Vagina: With white to yellowish discharge.  Bilateral labia minora generous in size but normal in appearance.  Left slightly larger than right  Cervix: Normal.  GC/Chlamydia screen.  Pap smear/HPV  Uterus: Anteverted, normal size, shape and contour, midline and mobile nontender   Adnexa: Without masses or tenderness    Anus and perineum: Normal    Assessment/Plan:  27 y.o. G0P0000 female for annual gynecologic exam with  regular menses, no contraception.   1. Vaginal discharge and odor.  Wet prep is consistent with bacterial vaginosis.  Will treat with Flagyl 500 mg twice daily times 7 days.  Alcohol avoidance reviewed.  We discussed if this becomes a recurrent issue strategies to help prevent this.  Weekly metronidazole or weekly Cleocin vaginal cream application as a suppressive therapy as well as boric acid suppositories were discussed.  I asked her to call me if she has a recurrence of her symptoms we will try to gauge how often this is occurring. 2. Generous labia minora bilaterally.  I reassured the patient that her exam is totally normal.  It is not bothersome to her as far as hygiene or clothing but she just noticed and wanted to know if it was normal. 3. Not using contraception.  Patient states that she is not looking to get pregnant but does not want to do anything to prevent it.  I reviewed with her than she more than likely will become pregnant.  I did recommend multivitamin with folic acid now preconceptionally.  She is not interested in discussing contraception at this time but will call if she changes her mind. 4. Breast exam normal today. 5. 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  In 2017 had normal colposcopy.  Pap smear/HPV 2018 was normal.  Pap smear/HPV today.  If negative then will go to less frequent screening interval. 6. Gardasil series previously reviewed and again encouraged.  Literature provided.  She will follow-up if she decides to pursue this. 7. STD screening requested.  No known exposure.  GC/Chlamydia, RPR, HIV, hepatitis B, hepatitis C ordered. 8. Health maintenance.  Baseline CBC and comprehensive metabolic panel ordered along with her STD blood work.  Follow-up in 1 year, sooner if any issues.    Allison Lords MD, 2:49 PM 10/20/2017

## 2017-10-21 LAB — HEPATITIS C ANTIBODY
Hepatitis C Ab: NONREACTIVE
SIGNAL TO CUT-OFF: 0.03 (ref <1.00)

## 2017-10-21 LAB — COMPREHENSIVE METABOLIC PANEL
AG Ratio: 1.3 (calc) (ref 1.0–2.5)
ALT: 9 U/L (ref 6–29)
AST: 18 U/L (ref 10–30)
Albumin: 4 g/dL (ref 3.6–5.1)
Alkaline phosphatase (APISO): 86 U/L (ref 33–115)
BUN: 11 mg/dL (ref 7–25)
CO2: 26 mmol/L (ref 20–32)
Calcium: 9.2 mg/dL (ref 8.6–10.2)
Chloride: 103 mmol/L (ref 98–110)
Creat: 0.66 mg/dL (ref 0.50–1.10)
Globulin: 3 g/dL (calc) (ref 1.9–3.7)
Glucose, Bld: 82 mg/dL (ref 65–99)
Potassium: 4.1 mmol/L (ref 3.5–5.3)
Sodium: 135 mmol/L (ref 135–146)
Total Bilirubin: 0.4 mg/dL (ref 0.2–1.2)
Total Protein: 7 g/dL (ref 6.1–8.1)

## 2017-10-21 LAB — CBC WITH DIFFERENTIAL/PLATELET
Basophils Absolute: 32 cells/uL (ref 0–200)
Basophils Relative: 0.5 %
Eosinophils Absolute: 70 cells/uL (ref 15–500)
Eosinophils Relative: 1.1 %
HCT: 40 % (ref 35.0–45.0)
Hemoglobin: 13.3 g/dL (ref 11.7–15.5)
Lymphs Abs: 2752 cells/uL (ref 850–3900)
MCH: 25.8 pg — ABNORMAL LOW (ref 27.0–33.0)
MCHC: 33.3 g/dL (ref 32.0–36.0)
MCV: 77.5 fL — ABNORMAL LOW (ref 80.0–100.0)
MPV: 9.2 fL (ref 7.5–12.5)
Monocytes Relative: 8.6 %
Neutro Abs: 2995 cells/uL (ref 1500–7800)
Neutrophils Relative %: 46.8 %
Platelets: 345 10*3/uL (ref 140–400)
RBC: 5.16 10*6/uL — ABNORMAL HIGH (ref 3.80–5.10)
RDW: 16.5 % — ABNORMAL HIGH (ref 11.0–15.0)
Total Lymphocyte: 43 %
WBC mixed population: 550 cells/uL (ref 200–950)
WBC: 6.4 10*3/uL (ref 3.8–10.8)

## 2017-10-21 LAB — HEPATITIS B SURFACE ANTIGEN: Hepatitis B Surface Ag: NONREACTIVE

## 2017-10-21 LAB — RPR: RPR Ser Ql: NONREACTIVE

## 2017-10-21 LAB — C. TRACHOMATIS/N. GONORRHOEAE RNA
C. trachomatis RNA, TMA: NOT DETECTED
N. gonorrhoeae RNA, TMA: NOT DETECTED

## 2017-10-21 LAB — HIV ANTIBODY (ROUTINE TESTING W REFLEX): HIV 1&2 Ab, 4th Generation: NONREACTIVE

## 2017-10-24 LAB — PAP IG AND HPV HIGH-RISK: HPV DNA High Risk: NOT DETECTED

## 2017-11-30 DIAGNOSIS — D508 Other iron deficiency anemias: Secondary | ICD-10-CM | POA: Diagnosis not present

## 2018-01-25 ENCOUNTER — Encounter: Payer: Self-pay | Admitting: Women's Health

## 2018-01-25 ENCOUNTER — Ambulatory Visit: Payer: BLUE CROSS/BLUE SHIELD | Admitting: Women's Health

## 2018-01-25 VITALS — BP 164/86

## 2018-01-25 DIAGNOSIS — Z3201 Encounter for pregnancy test, result positive: Secondary | ICD-10-CM

## 2018-01-25 DIAGNOSIS — N912 Amenorrhea, unspecified: Secondary | ICD-10-CM | POA: Diagnosis not present

## 2018-01-25 DIAGNOSIS — O3680X0 Pregnancy with inconclusive fetal viability, not applicable or unspecified: Secondary | ICD-10-CM

## 2018-01-25 LAB — PREGNANCY, URINE: Preg Test, Ur: POSITIVE — AB

## 2018-01-25 NOTE — Patient Instructions (Signed)
Please allow [place] to work in area where lifting is limited to 20 pounds   First Trimester of Pregnancy The first trimester of pregnancy is from week 1 until the end of week 13 (months 1 through 3). During this time, your baby will begin to develop inside you. At 6-8 weeks, the eyes and face are formed, and the heartbeat can be seen on ultrasound. At the end of 12 weeks, all the baby's organs are formed. Prenatal care is all the medical care you receive before the birth of your baby. Make sure you get good prenatal care and follow all of your doctor's instructions. Follow these instructions at home: Medicines  Take over-the-counter and prescription medicines only as told by your doctor. Some medicines are safe and some medicines are not safe during pregnancy.  Take a prenatal vitamin that contains at least 600 micrograms (mcg) of folic acid.  If you have trouble pooping (constipation), take medicine that will make your stool soft (stool softener) if your doctor approves. Eating and drinking  Eat regular, healthy meals.  Your doctor will tell you the amount of weight gain that is right for you.  Avoid raw meat and uncooked cheese.  If you feel sick to your stomach (nauseous) or throw up (vomit): ? Eat 4 or 5 small meals a day instead of 3 large meals. ? Try eating a few soda crackers. ? Drink liquids between meals instead of during meals.  To prevent constipation: ? Eat foods that are high in fiber, like fresh fruits and vegetables, whole grains, and beans. ? Drink enough fluids to keep your pee (urine) clear or pale yellow. Activity  Exercise only as told by your doctor. Stop exercising if you have cramps or pain in your lower belly (abdomen) or low back.  Do not exercise if it is too hot, too humid, or if you are in a place of great height (high altitude).  Try to avoid standing for long periods of time. Move your legs often if you must stand in one place for a long  time.  Avoid heavy lifting.  Wear low-heeled shoes. Sit and stand up straight.  You can have sex unless your doctor tells you not to. Relieving pain and discomfort  Wear a good support bra if your breasts are sore.  Take warm water baths (sitz baths) to soothe pain or discomfort caused by hemorrhoids. Use hemorrhoid cream if your doctor says it is okay.  Rest with your legs raised if you have leg cramps or low back pain.  If you have puffy, bulging veins (varicose veins) in your legs: ? Wear support hose or compression stockings as told by your doctor. ? Raise (elevate) your feet for 15 minutes, 3-4 times a day. ? Limit salt in your food. Prenatal care  Schedule your prenatal visits by the twelfth week of pregnancy.  Write down your questions. Take them to your prenatal visits.  Keep all your prenatal visits as told by your doctor. This is important. Safety  Wear your seat belt at all times when driving.  Make a list of emergency phone numbers. The list should include numbers for family, friends, the hospital, and police and fire departments. General instructions  Ask your doctor for a referral to a local prenatal class. Begin classes no later than at the start of month 6 of your pregnancy.  Ask for help if you need counseling or if you need help with nutrition. Your doctor can give you advice or  tell you where to go for help.  Do not use hot tubs, steam rooms, or saunas.  Do not douche or use tampons or scented sanitary pads.  Do not cross your legs for long periods of time.  Avoid all herbs and alcohol. Avoid drugs that are not approved by your doctor.  Do not use any tobacco products, including cigarettes, chewing tobacco, and electronic cigarettes. If you need help quitting, ask your doctor. You may get counseling or other support to help you quit.  Avoid cat litter boxes and soil used by cats. These carry germs that can cause birth defects in the baby and can cause  a loss of your baby (miscarriage) or stillbirth.  Visit your dentist. At home, brush your teeth with a soft toothbrush. Be gentle when you floss. Contact a doctor if:  You are dizzy.  You have mild cramps or pressure in your lower belly.  You have a nagging pain in your belly area.  You continue to feel sick to your stomach, you throw up, or you have watery poop (diarrhea).  You have a bad smelling fluid coming from your vagina.  You have pain when you pee (urinate).  You have increased puffiness (swelling) in your face, hands, legs, or ankles. Get help right away if:  You have a fever.  You are leaking fluid from your vagina.  You have spotting or bleeding from your vagina.  You have very bad belly cramping or pain.  You gain or lose weight rapidly.  You throw up blood. It may look like coffee grounds.  You are around people who have [place] measles, fifth disease, or chickenpox.  You have a very bad headache.  You have shortness of breath.  You have any kind of trauma, such as from a fall or a car accident. Summary  The first trimester of pregnancy is from week 1 until the end of week 13 (months 1 through 3).  To take care of yourself and your unborn baby, you will need to eat healthy meals, take medicines only if your doctor tells you to do so, and do activities that are safe for you and your baby.  Keep all follow-up visits as told by your doctor. This is important as your doctor will have to ensure that your baby is healthy and growing well. This information is not intended to replace advice given to you by your health care provider. Make sure you discuss any questions you have with your health care provider. Document Released: 12/15/2007 Document Revised: 07/06/2016 Document Reviewed: 07/06/2016 Elsevier Interactive Patient Education  2017 [employer]

## 2018-01-25 NOTE — Progress Notes (Signed)
27 year old SBF presents with home positive UPT.  Denies urinary symptoms, vaginal discharge, spotting, abdominal pain or fever.  Same partner with negative STD screen.  LMP May 31, normal cycle.  Had been prescribed OCs in April but did not start them.  Happy with pregnancy.  Exam: Appears well.  Blood pressure elevated  Early pregnancy Elevated blood pressure  Plan: Instructed to re-check blood pressure away from office, blood pressure normal in the past, normal April 2019.  Return to office after July 25 for viability/dating ultrasound.  Safe pregnancy behaviors reviewed.  Reviewed importance of taking a prenatal vitamin daily.  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  Encouraged to avoid snack foods, added salt to food or fried food.

## 2018-02-20 ENCOUNTER — Other Ambulatory Visit: Payer: BLUE CROSS/BLUE SHIELD

## 2018-02-20 ENCOUNTER — Ambulatory Visit: Payer: BLUE CROSS/BLUE SHIELD | Admitting: Women's Health

## 2018-02-22 ENCOUNTER — Ambulatory Visit (INDEPENDENT_AMBULATORY_CARE_PROVIDER_SITE_OTHER): Payer: BLUE CROSS/BLUE SHIELD

## 2018-02-22 ENCOUNTER — Other Ambulatory Visit: Payer: Self-pay | Admitting: Women's Health

## 2018-02-22 ENCOUNTER — Ambulatory Visit: Payer: BLUE CROSS/BLUE SHIELD | Admitting: Women's Health

## 2018-02-22 ENCOUNTER — Encounter: Payer: Self-pay | Admitting: Women's Health

## 2018-02-22 VITALS — BP 126/80

## 2018-02-22 DIAGNOSIS — O3680X0 Pregnancy with inconclusive fetal viability, not applicable or unspecified: Secondary | ICD-10-CM

## 2018-02-22 DIAGNOSIS — O3680X1 Pregnancy with inconclusive fetal viability, fetus 1: Secondary | ICD-10-CM

## 2018-02-22 DIAGNOSIS — Z3491 Encounter for supervision of normal pregnancy, unspecified, first trimester: Secondary | ICD-10-CM

## 2018-02-22 NOTE — Patient Instructions (Signed)
First Trimester of Pregnancy The first trimester of pregnancy is from week 1 until the end of week 13 (months 1 through 3). During this time, your baby will begin to develop inside you. At 6-8 weeks, the eyes and face are formed, and the heartbeat can be seen on ultrasound. At the end of 12 weeks, all the baby's organs are formed. Prenatal care is all the medical care you receive before the birth of your baby. Make sure you get good prenatal care and follow all of your doctor's instructions. Follow these instructions at home: Medicines  Take over-the-counter and prescription medicines only as told by your doctor. Some medicines are safe and some medicines are not safe during pregnancy.  Take a prenatal vitamin that contains at least 600 micrograms (mcg) of folic acid.  If you have trouble pooping (constipation), take medicine that will make your stool soft (stool softener) if your doctor approves. Eating and drinking  Eat regular, healthy meals.  Your doctor will tell you the amount of weight gain that is right for you.  Avoid raw meat and uncooked cheese.  If you feel sick to your stomach (nauseous) or throw up (vomit): ? Eat 4 or 5 small meals a day instead of 3 large meals. ? Try eating a few soda crackers. ? Drink liquids between meals instead of during meals.  To prevent constipation: ? Eat foods that are high in fiber, like fresh fruits and vegetables, whole grains, and beans. ? Drink enough fluids to keep your pee (urine) clear or pale yellow. Activity  Exercise only as told by your doctor. Stop exercising if you have cramps or pain in your lower belly (abdomen) or low back.  Do not exercise if it is too hot, too humid, or if you are in a place of great height (high altitude).  Try to avoid standing for long periods of time. Move your legs often if you must stand in one place for a long time.  Avoid heavy lifting.  Wear low-heeled shoes. Sit and stand up straight.  You  can have sex unless your doctor tells you not to. Relieving pain and discomfort  Wear a good support bra if your breasts are sore.  Take warm water baths (sitz baths) to soothe pain or discomfort caused by hemorrhoids. Use hemorrhoid cream if your doctor says it is okay.  Rest with your legs raised if you have leg cramps or low back pain.  If you have puffy, bulging veins (varicose veins) in your legs: ? Wear support hose or compression stockings as told by your doctor. ? Raise (elevate) your feet for 15 minutes, 3-4 times a day. ? Limit salt in your food. Prenatal care  Schedule your prenatal visits by the twelfth week of pregnancy.  Write down your questions. Take them to your prenatal visits.  Keep all your prenatal visits as told by your doctor. This is important. Safety  Wear your seat belt at all times when driving.  Make a list of emergency phone numbers. The list should include numbers for family, friends, the hospital, and police and fire departments. General instructions  Ask your doctor for a referral to a local prenatal class. Begin classes no later than at the start of month 6 of your pregnancy.  Ask for help if you need counseling or if you need help with nutrition. Your doctor can give you advice or tell you where to go for help.  Do not use hot tubs, steam rooms, or   saunas.  Do not douche or use tampons or scented sanitary pads.  Do not cross your legs for long periods of time.  Avoid all herbs and alcohol. Avoid drugs that are not approved by your doctor.  Do not use any tobacco products, including cigarettes, chewing tobacco, and electronic cigarettes. If you need help quitting, ask your doctor. You may get counseling or other support to help you quit.  Avoid cat litter boxes and soil used by cats. These carry germs that can cause birth defects in the baby and can cause a loss of your baby (miscarriage) or stillbirth.  Visit your dentist. At home, brush  your teeth with a soft toothbrush. Be gentle when you floss. Contact a doctor if:  You are dizzy.  You have mild cramps or pressure in your lower belly.  You have a nagging pain in your belly area.  You continue to feel sick to your stomach, you throw up, or you have watery poop (diarrhea).  You have a bad smelling fluid coming from your vagina.  You have pain when you pee (urinate).  You have increased puffiness (swelling) in your face, hands, legs, or ankles. Get help right away if:  You have a fever.  You are leaking fluid from your vagina.  You have spotting or bleeding from your vagina.  You have very bad belly cramping or pain.  You gain or lose weight rapidly.  You throw up blood. It may look like coffee grounds.  You are around people who have German measles, fifth disease, or chickenpox.  You have a very bad headache.  You have shortness of breath.  You have any kind of trauma, such as from a fall or a car accident. Summary  The first trimester of pregnancy is from week 1 until the end of week 13 (months 1 through 3).  To take care of yourself and your unborn baby, you will need to eat healthy meals, take medicines only if your doctor tells you to do so, and do activities that are safe for you and your baby.  Keep all follow-up visits as told by your doctor. This is important as your doctor will have to ensure that your baby is healthy and growing well. This information is not intended to replace advice given to you by your health care provider. Make sure you discuss any questions you have with your health care provider. Document Released: 12/15/2007 Document Revised: 07/06/2016 Document Reviewed: 07/06/2016 Elsevier Interactive Patient Education  2017 [employer] Inc.  

## 2018-02-22 NOTE — Progress Notes (Signed)
27 year old SBF presents for viability/dating ultrasound.  Denies abdominal pain, discharge, spotting, bleeding or nausea.  Taking prenatal vitamin daily.  Exam: Appears well.  Ultrasound: T/V images, anteverted uterus with living IUP seen in fundus.  Size equal dates by EGA LMP 10 weeks 5 days, CRL 10 weeks 4 days.  Fetal pole seen.  Fetal heart motion seen 153 bpm.  Cervix long and closed.  Anterior placenta.  Right and left ovary normal.  Negative cul-de-sac.  First trimester pregnancy   Plan: Ultrasound results reviewed.  Copy of ultrasound report given to take to first prenatal appointment.  Instructed to schedule.  Aware we no longer deliver.  Safe pregnancy behaviors discussed and reviewed.  Continue prenatal vitamin daily.  Congratulations given.

## 2018-03-02 ENCOUNTER — Telehealth: Payer: Self-pay | Admitting: *Deleted

## 2018-03-02 NOTE — Telephone Encounter (Signed)
Pt [facility]. I left a message that we need a release form. One was emailed to her. I also advised she can request these from MyChart as well.  Advised to call back if more questions. KW CMA

## 2018-03-08 DIAGNOSIS — Z113 Encounter for screening for infections with a predominantly sexual mode of transmission: Secondary | ICD-10-CM | POA: Diagnosis not present

## 2018-03-08 DIAGNOSIS — N925 Other specified irregular menstruation: Secondary | ICD-10-CM | POA: Diagnosis not present

## 2018-03-08 DIAGNOSIS — N912 Amenorrhea, unspecified: Secondary | ICD-10-CM | POA: Diagnosis not present

## 2018-04-06 DIAGNOSIS — Z369 Encounter for antenatal screening, unspecified: Secondary | ICD-10-CM | POA: Diagnosis not present

## 2018-04-06 DIAGNOSIS — M545 Low back pain: Secondary | ICD-10-CM | POA: Diagnosis not present

## 2018-04-06 DIAGNOSIS — Z8774 Personal history of (corrected) congenital malformations of heart and circulatory system: Secondary | ICD-10-CM | POA: Diagnosis not present

## 2018-04-06 DIAGNOSIS — Z3482 Encounter for supervision of other normal pregnancy, second trimester: Secondary | ICD-10-CM | POA: Diagnosis not present

## 2018-04-06 DIAGNOSIS — Z3492 Encounter for supervision of normal pregnancy, unspecified, second trimester: Secondary | ICD-10-CM | POA: Diagnosis not present

## 2018-04-06 DIAGNOSIS — Z3A16 16 weeks gestation of pregnancy: Secondary | ICD-10-CM | POA: Diagnosis not present

## 2018-05-04 ENCOUNTER — Other Ambulatory Visit (HOSPITAL_COMMUNITY): Payer: Self-pay | Admitting: Obstetrics and Gynecology

## 2018-05-04 ENCOUNTER — Encounter (HOSPITAL_COMMUNITY): Payer: Self-pay

## 2018-05-04 DIAGNOSIS — Z3492 Encounter for supervision of normal pregnancy, unspecified, second trimester: Secondary | ICD-10-CM | POA: Diagnosis not present

## 2018-05-04 DIAGNOSIS — Z3A20 20 weeks gestation of pregnancy: Secondary | ICD-10-CM | POA: Diagnosis not present

## 2018-05-04 DIAGNOSIS — Z8774 Personal history of (corrected) congenital malformations of heart and circulatory system: Secondary | ICD-10-CM | POA: Diagnosis not present

## 2018-05-04 DIAGNOSIS — Z3A21 21 weeks gestation of pregnancy: Secondary | ICD-10-CM

## 2018-05-04 DIAGNOSIS — O358XX0 Maternal care for other (suspected) fetal abnormality and damage, not applicable or unspecified: Secondary | ICD-10-CM | POA: Diagnosis not present

## 2018-05-04 DIAGNOSIS — Z363 Encounter for antenatal screening for malformations: Secondary | ICD-10-CM | POA: Diagnosis not present

## 2018-05-04 DIAGNOSIS — O283 Abnormal ultrasonic finding on antenatal screening of mother: Secondary | ICD-10-CM

## 2018-05-04 DIAGNOSIS — Z3402 Encounter for supervision of normal first pregnancy, second trimester: Secondary | ICD-10-CM | POA: Diagnosis not present

## 2018-05-05 ENCOUNTER — Encounter (HOSPITAL_COMMUNITY): Payer: Self-pay

## 2018-05-05 ENCOUNTER — Other Ambulatory Visit (HOSPITAL_COMMUNITY): Payer: Self-pay | Admitting: Obstetrics and Gynecology

## 2018-05-05 ENCOUNTER — Ambulatory Visit (HOSPITAL_COMMUNITY): Admission: RE | Admit: 2018-05-05 | Payer: BLUE CROSS/BLUE SHIELD | Source: Ambulatory Visit

## 2018-05-05 ENCOUNTER — Ambulatory Visit (HOSPITAL_COMMUNITY)
Admission: RE | Admit: 2018-05-05 | Discharge: 2018-05-05 | Disposition: A | Discharge status: Home / Self Care | Payer: BLUE CROSS/BLUE SHIELD | Source: Ambulatory Visit | Attending: Obstetrics and Gynecology | Admitting: Obstetrics and Gynecology

## 2018-05-05 ENCOUNTER — Ambulatory Visit (HOSPITAL_BASED_OUTPATIENT_CLINIC_OR_DEPARTMENT_OTHER)
Admission: RE | Admit: 2018-05-05 | Discharge: 2018-05-05 | Disposition: A | Discharge status: Home / Self Care | Payer: BLUE CROSS/BLUE SHIELD | Source: Ambulatory Visit | Attending: Obstetrics and Gynecology | Admitting: Obstetrics and Gynecology

## 2018-05-05 DIAGNOSIS — O09892 Supervision of other high risk pregnancies, second trimester: Secondary | ICD-10-CM | POA: Diagnosis not present

## 2018-05-05 DIAGNOSIS — Z3A21 21 weeks gestation of pregnancy: Secondary | ICD-10-CM

## 2018-05-05 DIAGNOSIS — O283 Abnormal ultrasonic finding on antenatal screening of mother: Secondary | ICD-10-CM

## 2018-05-05 DIAGNOSIS — Z363 Encounter for antenatal screening for malformations: Secondary | ICD-10-CM | POA: Diagnosis not present

## 2018-05-05 DIAGNOSIS — Z8249 Family history of ischemic heart disease and other diseases of the circulatory system: Secondary | ICD-10-CM | POA: Insufficient documentation

## 2018-05-05 DIAGNOSIS — O359XX0 Maternal care for (suspected) fetal abnormality and damage, unspecified, not applicable or unspecified: Secondary | ICD-10-CM | POA: Diagnosis not present

## 2018-05-05 HISTORY — DX: Pre-excitation syndrome: I45.6

## 2018-05-05 NOTE — Consult Note (Signed)
Maternal-Fetal Medicine  MRN: [id number] Requesting Provider: Jaymes Graff, MD  Allison Jones, G1 P0 at 21-weeks' gestation, is here for ultrasound and consultation. She was accompanied by her mother. On your office ultrasound, ventriculomegaly and thickened nuchal fold were seen.  On cell-free fetal DNA screening, the risks of fetal aneuploidies were not increased. 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 She does not have diabetes or hypertension or any other chronic medical conditions. PSH: Tonsillectomy. Allergies: NKDA. Social: Denies tobacco or drug or alcohol use. She is single. Family: No history of venous thromboembolism.  We performed a fetal anatomy scan. Fetal biometry is consistent with her previously-established dates. Amniotic fluid is normal and good fetal activity is seen. Following findings are seen: -Slightly abnormally-shaped cranium. -Ventriculomegaly (12 mm) with dangling choroids. -CSP appears normal with intact corpus callosum. -Thickended nuchal-fold (13 mm). -Nose and lips appear normal. -Micrognathia or retrognathia (narrow inferior facial angle). -Rt orbit appears abnormal; lens not seen. -Left deviation of cardiac axis. -4-chamber view, ventricular septum and outflow tracts and 3-vessel view appear normal. Aortic arch appears abnormal (?interrupted). -Chest appears smaller; normal diaphragm. -Bilateral echogenic kidneys. -3-vessel cord (normal) and abdominal cord insertion appears normal. -Both femurs appear shortened. 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  I counseled the patient on the following:  Multiple fetal anomalies: I explained the findings of multiple anomalies that raises a strong possibility of chromosomal anomaly or genetic syndrome. It is difficult to accurately-diagnose on ultrasound alone. I recommended amniocentesis for fetal karyotype and microarray  analysis that detects some (not all) genetic conditions).  I explained the procedure and possible complication of miscarriage (1 in 400 procedures). I also informed her that anomalous fetus has a higher risk of miscarriage or intrauterine death that is independent of procedure-related risk. I discussed genetic counseling. Patient will return at a later date for genetic counseling.  After counseling, she opted for amniocentesis.  Patient asked about termination of pregnancy and may consider having termination. She will discuss with her provider.  After informed consent, amniocentesis was performed by Dr. Judeth Cornfield under ultrasound guidance and 28 milliliters of clear amniotic fluid was withdrawn. Fluid was sent for fetal karyotype and microarray analysis. Patient tolerated the procedure well. Post-procedure fetal heart rate was normal. We gave her post-procedure instructions.  WPW syndrome: Patient does not have arrhythmia following ablation. I do not expect any maternal adverse outcomes in pregnancy. She had cardiology consultation last year.  Thank you for your consult. Please do not hesitate to contact me if you have any questions or concerns.  Consultation including face-to-face counseling: 40 min.

## 2018-05-08 ENCOUNTER — Telehealth (HOSPITAL_COMMUNITY): Payer: Self-pay | Admitting: *Deleted

## 2018-05-08 ENCOUNTER — Other Ambulatory Visit (HOSPITAL_COMMUNITY): Payer: Self-pay

## 2018-05-08 ENCOUNTER — Other Ambulatory Visit (HOSPITAL_COMMUNITY): Payer: Self-pay | Admitting: *Deleted

## 2018-05-08 DIAGNOSIS — O359XX0 Maternal care for (suspected) fetal abnormality and damage, unspecified, not applicable or unspecified: Secondary | ICD-10-CM

## 2018-05-09 ENCOUNTER — Ambulatory Visit (HOSPITAL_COMMUNITY): Payer: Self-pay

## 2018-05-10 ENCOUNTER — Telehealth (HOSPITAL_COMMUNITY): Payer: Self-pay | Admitting: *Deleted

## 2018-05-10 NOTE — Telephone Encounter (Signed)
Pt calling for amnio result.  Name and DOB verified.  Normal FISH result given, chromosome microarray pending.  Pt voices understanding.

## 2018-05-12 ENCOUNTER — Ambulatory Visit (HOSPITAL_COMMUNITY): Payer: BLUE CROSS/BLUE SHIELD

## 2018-05-12 DIAGNOSIS — Z3A22 22 weeks gestation of pregnancy: Secondary | ICD-10-CM | POA: Diagnosis not present

## 2018-05-12 DIAGNOSIS — O359XX0 Maternal care for (suspected) fetal abnormality and damage, unspecified, not applicable or unspecified: Secondary | ICD-10-CM | POA: Diagnosis not present

## 2018-05-17 ENCOUNTER — Other Ambulatory Visit (HOSPITAL_COMMUNITY): Payer: Self-pay

## 2018-05-18 ENCOUNTER — Telehealth (HOSPITAL_COMMUNITY): Payer: Self-pay | Admitting: *Deleted

## 2018-05-22 ENCOUNTER — Inpatient Hospital Stay (HOSPITAL_COMMUNITY): Admission: RE | Admit: 2018-05-22 | Payer: BLUE CROSS/BLUE SHIELD | Source: Ambulatory Visit

## 2018-05-25 ENCOUNTER — Other Ambulatory Visit (HOSPITAL_COMMUNITY): Payer: Self-pay

## 2018-05-31 ENCOUNTER — Ambulatory Visit (HOSPITAL_COMMUNITY): Payer: Self-pay

## 2018-06-01 ENCOUNTER — Encounter (HOSPITAL_COMMUNITY): Payer: Self-pay

## 2018-06-01 ENCOUNTER — Ambulatory Visit (HOSPITAL_COMMUNITY)
Admission: RE | Admit: 2018-06-01 | Discharge: 2018-06-01 | Disposition: A | Discharge status: Home / Self Care | Payer: BLUE CROSS/BLUE SHIELD | Source: Ambulatory Visit | Attending: Obstetrics and Gynecology | Admitting: Obstetrics and Gynecology

## 2018-06-14 DIAGNOSIS — Z332 Encounter for elective termination of pregnancy: Secondary | ICD-10-CM | POA: Diagnosis not present

## 2018-08-14 DIAGNOSIS — L219 Seborrheic dermatitis, unspecified: Secondary | ICD-10-CM | POA: Diagnosis not present

## 2018-08-14 DIAGNOSIS — L300 Nummular dermatitis: Secondary | ICD-10-CM | POA: Diagnosis not present

## 2018-10-18 ENCOUNTER — Other Ambulatory Visit: Payer: Self-pay

## 2018-10-19 ENCOUNTER — Encounter: Payer: Self-pay | Admitting: Gynecology

## 2018-10-19 ENCOUNTER — Ambulatory Visit (INDEPENDENT_AMBULATORY_CARE_PROVIDER_SITE_OTHER): Payer: BLUE CROSS/BLUE SHIELD | Admitting: Gynecology

## 2018-10-19 VITALS — BP 124/70 | Ht 65.0 in | Wt 196.0 lb

## 2018-10-19 DIAGNOSIS — N898 Other specified noninflammatory disorders of vagina: Secondary | ICD-10-CM | POA: Diagnosis not present

## 2018-10-19 DIAGNOSIS — Z202 Contact with and (suspected) exposure to infections with a predominantly sexual mode of transmission: Secondary | ICD-10-CM

## 2018-10-19 DIAGNOSIS — Z01419 Encounter for gynecological examination (general) (routine) without abnormal findings: Secondary | ICD-10-CM | POA: Diagnosis not present

## 2018-10-19 LAB — WET PREP FOR TRICH, YEAST, CLUE

## 2018-10-19 NOTE — Progress Notes (Signed)
    Allison Jones 1991/02/16 [id number]        28 y.o.  G1P0010 for annual gynecologic exam.  Also notes a slight vaginal discharge.  No odor or itching.  Requests STD screening.  No known exposure.  Had pregnancy last year which ultimately was terminated due to fetal abnormalities.  She was told it was not genetic.  Past medical history,surgical history, problem list, medications, allergies, family history and social history were all reviewed and documented as reviewed in the EPIC chart.  ROS:  Performed with pertinent positives and negatives included in the history, assessment and plan.   Additional significant findings : None   Exam: Kennon Portela assistant Vitals:   10/19/18 1200  BP: 124/70  Weight: 196 lb (88.9 kg)  Height: 5\' 5"  (1.651 m)   Body mass index is 32.62 kg/m.  General appearance:  Normal affect, orientation and appearance. Skin: Grossly normal HEENT: Without gross lesions.  No cervical or supraclavicular adenopathy. Thyroid normal.  Lungs:  Clear without wheezing, rales or rhonchi Cardiac: RR, without RMG Abdominal:  Soft, nontender, without masses, guarding, rebound, organomegaly or hernia Breasts:  Examined lying and sitting without masses, retractions, discharge or axillary adenopathy. Pelvic:  Ext, BUS, Vagina: With slight white discharge  Cervix: Normal.  GC/chlamydia screen  Uterus: Anteverted, normal size, shape and contour, midline and mobile nontender   Adnexa: Without masses or tenderness    Anus and perineum: Normal    Assessment/Plan:  28 y.o. G60P0010 female for annual gynecologic exam.  With regular menses, no contraception  1. Contraception.  Patient not interested in contraception and would accept pregnancy if it occurs.  We discussed her history of fetal malformations.  She was told that it was not genetic.  I do not have records from this.  Currently using no contraception and would accept pregnancy if it occurs.  Recommended she start on  prenatal vitamins now preconceptionally.  To follow-up ASAP when she achieves pregnancy with her obstetrical group for early evaluation. 2. Vaginal discharge.  Wet prep is negative.  Without other symptoms.  Will monitor for now.  If persists will re-present for reevaluation. 3. STD screening requested.  GC/chlamydia, HIV, RPR, hepatitis B, hepatitis C ordered. 4. Breast health.  SBE monthly reviewed. 5. Pap smear/HPV negative 2019.  No Pap smear done today.  Pap smear/HPV also -2018.  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  Colposcopy normal in 2017.  Plan repeat Pap smear/HPV at 5-year interval. 6. Health maintenance.  Baseline CBC and comprehensive metabolic panel ordered with her above blood work.   [name] MD, 12:22 PM 10/19/2018

## 2018-10-19 NOTE — Patient Instructions (Addendum)
Follow-up as soon as you achieve pregnancy for evaluation by the obstetrical doctors  Follow-up in 1 year for annual gynecologic exam

## 2018-10-19 NOTE — Addendum Note (Signed)
Addended by: Dayna Barker on: 10/19/2018 12:46 PM   Modules accepted: Orders

## 2018-10-20 LAB — COMPREHENSIVE METABOLIC PANEL
AG Ratio: 1.3 (calc) (ref 1.0–2.5)
ALT: 14 U/L (ref 6–29)
AST: 23 U/L (ref 10–30)
Albumin: 4 g/dL (ref 3.6–5.1)
Alkaline phosphatase (APISO): 83 U/L (ref 31–125)
BUN: 8 mg/dL (ref 7–25)
CO2: 19 mmol/L — ABNORMAL LOW (ref 20–32)
Calcium: 8.9 mg/dL (ref 8.6–10.2)
Chloride: 105 mmol/L (ref 98–110)
Creat: 0.7 mg/dL (ref 0.50–1.10)
Globulin: 3 g/dL (calc) (ref 1.9–3.7)
Glucose, Bld: 81 mg/dL (ref 65–99)
Potassium: 4 mmol/L (ref 3.5–5.3)
Sodium: 135 mmol/L (ref 135–146)
Total Bilirubin: 0.3 mg/dL (ref 0.2–1.2)
Total Protein: 7 g/dL (ref 6.1–8.1)

## 2018-10-20 LAB — CBC WITH DIFFERENTIAL/PLATELET
Absolute Monocytes: 670 cells/uL (ref 200–950)
Basophils Absolute: 39 cells/uL (ref 0–200)
Basophils Relative: 0.5 %
Eosinophils Absolute: 46 cells/uL (ref 15–500)
Eosinophils Relative: 0.6 %
HCT: 37.5 % (ref 35.0–45.0)
Hemoglobin: 11.9 g/dL (ref 11.7–15.5)
Lymphs Abs: 2649 cells/uL (ref 850–3900)
MCH: 22.4 pg — ABNORMAL LOW (ref 27.0–33.0)
MCHC: 31.7 g/dL — ABNORMAL LOW (ref 32.0–36.0)
MCV: 70.6 fL — ABNORMAL LOW (ref 80.0–100.0)
MPV: 11.3 fL (ref 7.5–12.5)
Monocytes Relative: 8.7 %
Neutro Abs: 4297 cells/uL (ref 1500–7800)
Neutrophils Relative %: 55.8 %
Platelets: 313 10*3/uL (ref 140–400)
RBC: 5.31 10*6/uL — ABNORMAL HIGH (ref 3.80–5.10)
RDW: 16.4 % — ABNORMAL HIGH (ref 11.0–15.0)
Total Lymphocyte: 34.4 %
WBC: 7.7 10*3/uL (ref 3.8–10.8)

## 2018-10-20 LAB — RPR: RPR Ser Ql: NONREACTIVE

## 2018-10-20 LAB — HEPATITIS B SURFACE ANTIGEN: Hepatitis B Surface Ag: NONREACTIVE

## 2018-10-20 LAB — HEPATITIS C ANTIBODY
Hepatitis C Ab: NONREACTIVE
SIGNAL TO CUT-OFF: 0.04 (ref <1.00)

## 2018-10-20 LAB — C. TRACHOMATIS/N. GONORRHOEAE RNA
C. trachomatis RNA, TMA: NOT DETECTED
N. gonorrhoeae RNA, TMA: NOT DETECTED

## 2018-10-20 LAB — HIV ANTIBODY (ROUTINE TESTING W REFLEX): HIV 1&2 Ab, 4th Generation: NONREACTIVE

## 2018-10-23 ENCOUNTER — Encounter: Payer: Self-pay | Admitting: Gynecology

## 2019-02-22 DIAGNOSIS — L703 Acne tropica: Secondary | ICD-10-CM | POA: Diagnosis not present

## 2019-04-02 ENCOUNTER — Encounter: Payer: Self-pay | Admitting: Gynecology

## 2019-04-18 DIAGNOSIS — Z6833 Body mass index (BMI) 33.0-33.9, adult: Secondary | ICD-10-CM | POA: Diagnosis not present

## 2019-04-18 DIAGNOSIS — Z0000 Encounter for general adult medical examination without abnormal findings: Secondary | ICD-10-CM | POA: Diagnosis not present

## 2019-04-19 DIAGNOSIS — Z113 Encounter for screening for infections with a predominantly sexual mode of transmission: Secondary | ICD-10-CM | POA: Diagnosis not present

## 2019-04-19 DIAGNOSIS — N925 Other specified irregular menstruation: Secondary | ICD-10-CM | POA: Diagnosis not present

## 2019-04-19 DIAGNOSIS — N912 Amenorrhea, unspecified: Secondary | ICD-10-CM | POA: Diagnosis not present

## 2019-04-23 DIAGNOSIS — Z113 Encounter for screening for infections with a predominantly sexual mode of transmission: Secondary | ICD-10-CM | POA: Diagnosis not present

## 2019-05-02 DIAGNOSIS — Z3A09 9 weeks gestation of pregnancy: Secondary | ICD-10-CM | POA: Diagnosis not present

## 2019-05-02 DIAGNOSIS — O3680X9 Pregnancy with inconclusive fetal viability, other fetus: Secondary | ICD-10-CM | POA: Diagnosis not present

## 2019-05-02 DIAGNOSIS — O021 Missed abortion: Secondary | ICD-10-CM | POA: Diagnosis not present

## 2019-05-18 DIAGNOSIS — R070 Pain in throat: Secondary | ICD-10-CM | POA: Diagnosis not present

## 2019-05-18 DIAGNOSIS — R05 Cough: Secondary | ICD-10-CM | POA: Diagnosis not present

## 2019-05-18 DIAGNOSIS — J0100 Acute maxillary sinusitis, unspecified: Secondary | ICD-10-CM | POA: Diagnosis not present

## 2019-05-18 DIAGNOSIS — Z20828 Contact with and (suspected) exposure to other viral communicable diseases: Secondary | ICD-10-CM | POA: Diagnosis not present

## 2019-06-28 DIAGNOSIS — Z113 Encounter for screening for infections with a predominantly sexual mode of transmission: Secondary | ICD-10-CM | POA: Diagnosis not present

## 2019-06-28 DIAGNOSIS — Z315 Encounter for genetic counseling: Secondary | ICD-10-CM | POA: Diagnosis not present

## 2019-06-28 DIAGNOSIS — O021 Missed abortion: Secondary | ICD-10-CM | POA: Diagnosis not present

## 2019-07-03 ENCOUNTER — Other Ambulatory Visit: Payer: Self-pay

## 2019-07-03 ENCOUNTER — Ambulatory Visit (HOSPITAL_COMMUNITY): Payer: BC Managed Care – PPO | Attending: Obstetrics and Gynecology | Admitting: Genetic Counselor

## 2019-07-03 ENCOUNTER — Ambulatory Visit (HOSPITAL_COMMUNITY): Payer: Self-pay | Admitting: Obstetrics and Gynecology

## 2019-07-03 DIAGNOSIS — Z3169 Encounter for other general counseling and advice on procreation: Secondary | ICD-10-CM | POA: Diagnosis not present

## 2019-07-03 DIAGNOSIS — Z31430 Encounter of female for testing for genetic disease carrier status for procreative management: Secondary | ICD-10-CM

## 2019-07-03 NOTE — Progress Notes (Signed)
07/03/2019  Verl Dickeravia N Rolston March 16, 1991 MRN: [id number] DOV: 07/03/2019  [facility] for a genetics consultation regarding her history of a previous fetus with multiple anomalies. Ms. Kathlene NovemberMcCormick came to her appointment alone due to COVID-19 visitor restrictions.   Indication for genetic counseling - Previous pregnancy with multiple anomalies  Prenatal history  Ms. Kathlene NovemberMcCormick is a 432P0020, 28 y.o. female. Prenatal history was not reviewed as this was a preconception consultation.  Family History  A three generation pedigree was drafted and reviewed. The family history is remarkable for the following:  - Ms. Kathlene NovemberMcCormick has a personal history of Wolff-Parkinson-White (WPW) syndrome treated via ablation. WPW syndrome is a condition characterized by abnormal electrical pathways in the heart that cause a disruption of the heart's normal rhythm (arrhythmia). In most cases, the cause of WPW syndrome is unknown; however, a small percentage of cases can be caused by mutations in the PRKAG2 gene. Most cases of WPW syndrome occur sporadically in individuals with no family history of the disease. These cases are not inherited. A small percentage of cases are familial and can be inherited in an autosomal dominant pattern. Given that no one else in Ms. Dunsworth's family has WPW syndrome, hers is likely not inherited and there is likely a low risk of recurrence for her children. However, given that a genetic abnormality has not been ruled out, her children could have up to a 50% risk of inheriting WPW syndrome themselves.   - Ms. Haigh reported a personal history of learning problems and developmental delays. 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 We discussed that many times, learning difficulties and developmental delays are multifactorial in nature, occurring due to a  combination of genetic and environmental factors that are difficult to identify. Learning disabilities and developmental delays can appear to run in families; thus, there is a chance that Ms. Wakefield's children could also experience learning difficulties or developmental delays of some kind.  - Ms. Ducat had a pregnancy from a prior relationship that was affected by multiple anomalies, including an abnormally shaped cranium, ventriculomegaly, a thick nuchal fold, micrognathia or retrognathia, cardiac abnormalities, a small chest, echogenic kidneys, and shortened legs with abnormal feet. Genetic testing performed via amniocentesis revealed a normal female karyotype and chromosomal microarray. Ms. Kathlene NovemberMcCormick opted to terminate this pregnancy in November 2019. In October 2020, Ms. Burcher had a miscarriage at 7 weeks' gestation. A cause was not identified for this miscarriage. Ms. Kathlene NovemberMcCormick had a different reproductive partner for each of these pregnancies. See Discussion section for more details.  The remaining family histories were reviewed and found to be noncontributory for birth defects, intellectual disability, recurrent pregnancy loss, and known genetic conditions.    The patient's ethnicity is African American. Ms. Atayde's partner's ethnicity is African American. Ashkenazi Jewish ancestry and consanguinity were denied. Pedigree will be scanned under Media.  Discussion  Ms. Norton was referred for genetic counseling due to her complicated pregnancy history. The fetus in her first pregnancy had multiple anomalies and Ms. Dacy had a miscarriage in her second pregnancy. We reviewed that the fetus from Ms. Coolman's first pregnancy had a normal karyotype and microarray, ruling out chromosome abnormalities. However, more extensive testing to determine the etiology of the fetus's anomalies was not performed. For this reason, a genetic cause for the anomalies has not been entirely ruled  out. Ms. Castoro's second  pregnancy ended in miscarriage at 7 weeks' gestation. We discussed that it is not possible to determine if the cause of Ms. Rochefort's recent miscarriage was related to her first pregnancy or whether these events were unrelated.   We discussed that if her first pregnancy had anomalies caused by a genetic condition, there are multiple ways it could have been inherited. Firstly, some genetic conditions are inherited in an autosomal dominant fashion. Autosomal dominant conditions only require a pathogenic variant to be present in one copy of a gene to cause the condition. Secondly, some genetic conditions are inherited in an autosomal recessive fashion. Both parents must be carriers of the same recessive condition in order to be at risk of having an affected child. Thirdly, some conditions are inherited in an X-linked fashion. X-linked conditions occur due to a pathogenic variant in a gene located on the X chromosome. X-linked conditions often affected males more severely than females, as males only have one X chromosome whereas females have two. Finally, some genetic conditions are de novo, or new, occurring for the first time in an affected individual. Since a genetic cause was not identified for the fetal anomalies in Ms. Gathers's first pregnancy, risk assessment is limited. Ms. Levenhagen was counseled that recurrence risk could be as high as 50% in the cases of autosomal dominant conditions or X-linked conditions in female fetuses. However, if Ms. Kirsten's first fetus had a de novo genetic condition, risk of recurrence for future pregnancies would likely be low. We discussed that precise recurrence risks and prognosis for future pregnancies depend on the underlying etiology of the fetal anomalies in the first pregnancy.  We discussed additional testing options currently available to her to assist in determining the etiology of her pregnancy complications. Expanded carrier  screening (ECS) is a testing option that evaluates carrier status for a wide range of genetic conditions. ECS panels can include >200 autosomal recessive or X-linked genetic conditions. Ms. Sida was counseled that if she were found to be a carrier for one or more recessive conditions, carrier screening for those conditions would be recommended for her partner. This could help determine whether future pregnancies with her current partner are at increased risk for a particular recessive genetic condition. If the fetal anomalies in Ms. Lodes's first pregnancy were due to a recessive condition, both Ms. Roehm and her previous partner would be obligate carriers for that condition. Given that Ms. Petterson has a new partner, he would need to be a carrier for the same condition in order for the couple to be at risk of having another affected child, which may be unlikely for very rare conditions where carrier frequency is low.   Although ECS is able to identify an individual's carrier status for many genetic disorders, it also has some limitations. We reviewed that ECS could possibly reveal information about Ms. Boxley's own health that she was previously unaware of. For example, some genes included on ECS panels confer an increased risk for cancer, dementia, and other health problems for carriers. Additionally, some of the conditions included on ECS are rare while others may occur more commonly. Some conditions may be severe and actionable, whereas other conditions may not yet be well-understood or may not have treatment options available. Additionally, a negative result does not indicate that there is not a genetic cause for the fetal anomalies identified in the first pregnancy, as ECS does not evaluate for all possible genetic conditions. Finally, ECS is not diagnostic. Even if one or both  parents are found to be carriers for a condition, their children would not be guaranteed to be affected. Ms.  Reinecke understood these risks and limitations and indicated that she was interested in pursuing ECS.  We also discussed screening/testing options that will be available to Ms. Buckner during future pregnancies. Firstly, we discussed various forms of noninvasive prenatal screening (NIPS). Standard NIPS analyzes cell free DNA originating from the placenta that is found in the maternal blood circulation during pregnancy. This test is not diagnostic for chromosome conditions, but can provide information regarding the presence or absence of extra fetal DNA for chromosomes 13, 18, 21, and the sex chromosomes. Thus, it does not identify or rule out all fetal aneuploidy. The reported detection rate is 91-99% for trisomies 21, 18, 13, and sex chromosome aneuploidies. The false positive rate is reported to be less than 0.1% for any of these conditions. [facility] NIPS is another option that screens for 25 autosomal or X-linked single gene disorders. [facility] genome-wide NIPS that provides information about seven clinically relevant microdeletion regions and gains or losses of chromosomal material ?7 Mb across all chromosomes. Like standard NIPS, these tests are NOT diagnostic. A positive result requires confirmation by diagnostic testing, and a negative test result does not rule out all single gene disorders or all chromosomal deletions/duplications.  Secondly, Ms. Bantz was counseled regarding the option of diagnostic testing via chorionic villus sampling (CVS) or amniocentesis. We discussed the technical aspects of each procedure and quoted up to a 1 in 500 (0.2%) risk for spontaneous pregnancy loss or other adverse pregnancy outcomes as a result of either procedure. Cultured cells from either a placental or amniotic fluid sample allow for the visualization of a fetal karyotype, which can detect >99% of chromosomal aberrations. Chromosomal microarray can also be performed to identify smaller deletions  or duplications of fetal chromosomal material. CVS and/or amniocentesis samples can also be used to test for other genetic conditions. However, it is difficult to determine which tests to order without knowing the precise cause of Ms. Ciampi's pregnancy complications, especially in the context of a normal ultrasound.  Finally, we discussed that ultrasound monitoring will help to determine if future fetuses are affected by anomalies. It is recommended that Ms. Nelson undergo a 12 week ultrasound with Maternal Fetal Medicine to identify any anomalies that may be identifiable in the first trimester. Additionally, a complete ultrasound will be performed around 18-20 weeks' gestation to identify fetal anomalies or markers suggestive of aneuploidy. However, ultrasounds cannot rule out all birth defects or genetic syndromes.  Ms. Skidgel was interested in pursuing expanded carrier screening. She is motivated to try to find an answer for why the anomalies occurred in her first pregnancy in order to inform recurrence risks and prognosis for her future pregnancies. Ms. Rabbani requested that I perform a benefits investigation with her insurance company before ordering testing to determine what her predicted out of pocket cost would be. We made a plan for me to contact her once I performed the benefits investigation. We discussed that if her out of pocket costs are expected to be high, we have the option of pursuing ECS via a patient pay option. Patient pay options for ECS are ~$250 and may be lower if Ms. Breeland qualifies for subsidized testing based on her income. We will review these options again once I contact Ms. Gossen after performing her benefits investigation. She was agreeable to this plan.  I counseled Ms. Fitz regarding the above risks and  available options. The approximate face-to-face time with the genetic counselor was 25 minutes.  In summary:  Discussed previous pregnancy history  and options for follow-up testing  First pregnancy affected by multiple fetal anomalies, second pregnancy resulted in miscarriage  Amniocentesis for first pregnancy revealed normal female karyotype and chromosomal microarray  No further genetic testing performed for either pregnancy  Discussed options for follow-up testing in the preconception period  Desires expanded carrier screening. I will follow-up with her and facilitate testing following a benefits investigation with her insurance company  Discussed screening/testing options available in future pregnancies  NIPS available to screen for chromosomal aneuploidies, chromosomal deletions/duplications, and 25 single gene conditions  Diagnostic testing available beginning at 11 weeks  Recommend first trimester ultrasound ~12 weeks as well as anatomy ultrasound with MFM in future pregnancies  Reviewed family history concerns   Gershon Crane, MS Genetic Counselor

## 2019-07-10 ENCOUNTER — Telehealth (HOSPITAL_COMMUNITY): Payer: Self-pay | Admitting: Genetic Counselor

## 2019-07-10 NOTE — Telephone Encounter (Signed)
LVM for Allison Jones indicating that I had completed the benefits investigation through her insurance company to determine what her out of pocket price would be for expanded carrier screening. Requested a call back to my direct line to discuss this in more detail as well as review alternative options, as no identifiers were provided in voicemail message.   Buelah Manis, MS Genetic Counselor

## 2019-07-12 ENCOUNTER — Telehealth (HOSPITAL_COMMUNITY): Payer: Self-pay | Admitting: Genetic Counselor

## 2019-07-12 NOTE — Telephone Encounter (Signed)
Received a call back from Ms. Olvera to discuss her benefits investigation. Per her insurance company, expanded carrier screening (ECS) would cost ~$1000. We reviewed that Ms. Perris has several options. One is to pursue ECS via patient pay instead of ordering testing through her insurance company. There are a couple of laboratories that offer patient pay prices of $250 for ECS. Another option is to apply for a compassionate care program to determine if she qualifies for reduced cost testing. Ms. Selle indicated that she was interested in applying to the compassionate care program, so I sent her an email containing instructions on how to do so through the laboratory [facility]. We made a plan for her to get in touch with me once she has applied and found out whether she qualifies for reduced cost testing. If she does and is satisfied with her out of pocket estimate, I will facilitate sample collection and the testing process from there.  Buelah Manis, MS Genetic Counselor

## 2019-07-13 NOTE — L&D Delivery Note (Signed)
Delivery Note Labor onset: 06/20/2020  Labor Onset Time: last night Complete dilation at   Onset of pushing at 904 FHR second stage Cat 2 Analgesia/Anesthesia intrapartum: N/A  Guided pushing with maternal urge. Delivery of a viable female at [phone]. Fetal head delivered in direct OP position. Nuchal cord N/A. Infant placed on maternal abd, dried, and tactile stim. Spontaneous cry at delivery. Cord double clamped after stopped pulsating and cut by FOB. Nsy present for birth. Cord blood sample collected   Arterial cord blood sample N/A.  Placenta delivered Allison Jones via Binnie Kand Maneuver, intact, with 3 VC.  Placenta to pathology d/t Near term delivery, Hx COVID, maternal B/Ps, small calcifications noted Uterine tone firm,  bleeding moderate  2nd degree laceration identified.  Anesthesia: lidocaine 1 percent Repair: 3-0 vicryl rapide QBL/EBL (mL): 300 Complications: N/A APGAR: APGAR (1 MIN):8   APGAR (5 MINS):  9 APGAR (10 MINS):    Pt with a hx of COVID 06/09/20. Has not been seen in office since COVID dx. Came into labor with elevated B/P no severe range. Will monitor B/P and drawl Preeclampsia labs if needed. Denies HA, visual disturbances or epigastric pain. No elevated B/p in office. Pt stable at present.   Mom to postpartum.  Baby to Couplet care / Skin to Skin.  Allison Jones Debborah Alonge MSN, CNM 06/21/2020, 10:12 AM

## 2019-07-30 ENCOUNTER — Ambulatory Visit: Payer: BC Managed Care – PPO | Admitting: Women's Health

## 2019-07-30 ENCOUNTER — Encounter: Payer: Self-pay | Admitting: Women's Health

## 2019-07-30 ENCOUNTER — Other Ambulatory Visit: Payer: Self-pay

## 2019-07-30 VITALS — BP 122/76

## 2019-07-30 DIAGNOSIS — Z113 Encounter for screening for infections with a predominantly sexual mode of transmission: Secondary | ICD-10-CM

## 2019-07-30 DIAGNOSIS — N760 Acute vaginitis: Secondary | ICD-10-CM

## 2019-07-30 DIAGNOSIS — B9689 Other specified bacterial agents as the cause of diseases classified elsewhere: Secondary | ICD-10-CM | POA: Diagnosis not present

## 2019-07-30 LAB — WET PREP FOR TRICH, YEAST, CLUE

## 2019-07-30 MED ORDER — METRONIDAZOLE 500 MG PO TABS: 500.0000 mg | ORAL_TABLET | Freq: Two times a day (BID) | ORAL | 0 refills | Status: DC

## 2019-07-30 NOTE — Patient Instructions (Signed)
Vit daily  Bacterial Vaginosis  Bacterial vaginosis is a vaginal infection that occurs when the normal balance of bacteria in the vagina is disrupted. It results from an overgrowth of certain bacteria. This is the most common vaginal infection among women ages 79-44. Because bacterial vaginosis increases your risk for STIs (sexually transmitted infections), getting treated can help reduce your risk for chlamydia, gonorrhea, herpes, and HIV (human immunodeficiency virus). Treatment is also important for preventing complications in pregnant women, because this condition can cause an early (premature) delivery. What are the causes? This condition is caused by an increase in harmful bacteria that are normally present in small amounts in the vagina. However, the reason that the condition develops is not fully understood. What increases the risk? The following factors may make you more likely to develop this condition:  Having a new sexual partner or multiple sexual partners.  Having unprotected sex.  Douching.  Having an intrauterine device (IUD).  Smoking.  Drug and alcohol abuse.  Taking certain antibiotic medicines.  Being pregnant. You cannot get bacterial vaginosis from toilet seats, bedding, swimming pools, or contact with objects around you. What are the signs or symptoms? Symptoms of this condition include:  Grey or white vaginal discharge. The discharge can also be watery or foamy.  A fish-like odor with discharge, especially after sexual intercourse or during menstruation.  Itching in and around the vagina.  Burning or pain with urination. Some women with bacterial vaginosis have no signs or symptoms. How is this diagnosed? This condition is diagnosed based on:  Your medical history.  A physical exam of the vagina.  Testing a sample of vaginal fluid under a microscope to look for a large amount of bad bacteria or abnormal cells. Your health care provider may use a  cotton swab or a small wooden spatula to collect the sample. How is this treated? This condition is treated with antibiotics. These may be given as a pill, a vaginal cream, or a medicine that is put into the vagina (suppository). If the condition comes back after treatment, a second round of antibiotics may be needed. Follow these instructions at home: Medicines  Take over-the-counter and prescription medicines only as told by your health care provider.  Take or use your antibiotic as told by your health care provider. Do not stop taking or using the antibiotic even if you start to feel better. General instructions  If you have a female sexual partner, tell her that you have a vaginal infection. She should see her health care provider and be treated if she has symptoms. If you have a female sexual partner, he does not need treatment.  During treatment: ? Avoid sexual activity until you finish treatment. ? Do not douche. ? Avoid alcohol as directed by your health care provider. ? Avoid breastfeeding as directed by your health care provider.  Drink enough water and fluids to keep your urine clear or pale yellow.  Keep the area around your vagina and rectum clean. ? Wash the area daily with warm water. ? Wipe yourself from front to back after using the toilet.  Keep all follow-up visits as told by your health care provider. This is important. How is this prevented?  Do not douche.  Wash the outside of your vagina with warm water only.  Use protection when having sex. This includes latex condoms and dental dams.  Limit how many sexual partners you have. To help prevent bacterial vaginosis, it is best to have sex with  just one partner (monogamous).  Make sure you and your sexual partner are tested for STIs.  Wear cotton or cotton-lined underwear.  Avoid wearing tight pants and pantyhose, especially during summer.  Limit the amount of alcohol that you drink.  Do not use any  products that contain nicotine or tobacco, such as cigarettes and e-cigarettes. If you need help quitting, ask your health care provider.  Do not use illegal drugs. Where to find more information  Centers for Disease Control and Prevention: SolutionApps.co.za  American Sexual Health Association (ASHA): www.ashastd.org  U.S. Department of Health and Health and safety inspector, Office on Women's Health: ConventionalMedicines.si or http://www.anderson-williamson.info/ Contact a health care provider if:  Your symptoms do not improve, even after treatment.  You have more discharge or pain when urinating.  You have a fever.  You have pain in your abdomen.  You have pain during sex.  You have vaginal bleeding between periods. Summary  Bacterial vaginosis is a vaginal infection that occurs when the normal balance of bacteria in the vagina is disrupted.  Because bacterial vaginosis increases your risk for STIs (sexually transmitted infections), getting treated can help reduce your risk for chlamydia, gonorrhea, herpes, and HIV (human immunodeficiency virus). Treatment is also important for preventing complications in pregnant women, because the condition can cause an early (premature) delivery.  This condition is treated with antibiotic medicines. These may be given as a pill, a vaginal cream, or a medicine that is put into the vagina (suppository). This information is not intended to replace advice given to you by your health care provider. Make sure you discuss any questions you have with your health care provider. Document Revised: 06/10/2017 Document Reviewed: 03/13/2016 Elsevier Patient Education  2020 [employer].

## 2019-07-30 NOTE — Progress Notes (Signed)
29 year old SBF G2 P0 presents with complaint of irregular cycle.  Reports normal cycle December 9 for 7 days but has had intermittent red to brown spotting since that stopped yesterday.  Normal 7-day cycle in November.  Minimal cramping.  Also having increased discharge with an odor, no itching.  Denies abdominal/back pain, urinary symptoms, or fever.  Current partner since May and thinks there was an STD screen done at [facility] but does not know the results and unsure.  Negative STD screen 10/2018.  Desires pregnancy.  Past 2 pregnancies with 2 different partners.  Cytotec induced AB October 2020 per  [place].  Has had genetic counseling and has scheduled follow-up at [facility] February 1 to review results.  Psychiatric nurse.  Taking prenatal vitamin daily.  No known health problems.  Exam: Appears well.  No CVAT.  Abdomen soft, obese, nontender, external genitalia within normal limits, speculum exam moderate amount of watery discharge with odor noted, wet prep positive for clues, TNTC bacteria.  GC/chlamydia culture taken.  Bimanual uterus small, nontender.  Bacterial vaginosis  Plan: Flagyl 500 twice daily for 7 days, alcohol precautions reviewed.  Instructed to call if continued issues with discharge.  Keep menstrual record, follow-up with [facility] as needed.  Keep scheduled follow-up at [facility] for  genetic testing results.  Continue prenatal vitamin daily.

## 2019-07-31 LAB — CHLAMYDIA PROBE AMP THINPREP: C. trachomatis RNA, TMA: NOT DETECTED

## 2019-07-31 LAB — GC PROBE AMP THINPREP: N. gonorrhoeae RNA, TMA: NOT DETECTED

## 2019-08-13 DIAGNOSIS — B009 Herpesviral infection, unspecified: Secondary | ICD-10-CM | POA: Diagnosis not present

## 2019-08-13 DIAGNOSIS — Z08 Encounter for follow-up examination after completed treatment for malignant neoplasm: Secondary | ICD-10-CM | POA: Diagnosis not present

## 2019-08-13 DIAGNOSIS — Z1371 Encounter for nonprocreative screening for genetic disease carrier status: Secondary | ICD-10-CM | POA: Diagnosis not present

## 2019-09-05 DIAGNOSIS — Z08 Encounter for follow-up examination after completed treatment for malignant neoplasm: Secondary | ICD-10-CM | POA: Diagnosis not present

## 2019-10-09 DIAGNOSIS — H5711 Ocular pain, right eye: Secondary | ICD-10-CM | POA: Diagnosis not present

## 2019-10-23 ENCOUNTER — Other Ambulatory Visit: Payer: Self-pay

## 2019-10-23 ENCOUNTER — Ambulatory Visit (INDEPENDENT_AMBULATORY_CARE_PROVIDER_SITE_OTHER): Payer: BC Managed Care – PPO | Admitting: Obstetrics and Gynecology

## 2019-10-23 ENCOUNTER — Encounter: Payer: Self-pay | Admitting: Obstetrics and Gynecology

## 2019-10-23 VITALS — BP 118/74 | Ht 65.0 in | Wt 190.0 lb

## 2019-10-23 DIAGNOSIS — Z01419 Encounter for gynecological examination (general) (routine) without abnormal findings: Secondary | ICD-10-CM | POA: Diagnosis not present

## 2019-10-23 DIAGNOSIS — N979 Female infertility, unspecified: Secondary | ICD-10-CM

## 2019-10-23 NOTE — Progress Notes (Signed)
Allison Jones Feb 28, 1991 [id number]  SUBJECTIVE:  [name] female for annual routine gynecologic exam. She has no gynecologic concerns.  She reports normal periods every month, no excessive bleeding or dysmenorrhea.  Her main concern involves desiring pregnancy.  She has had 2 pregnancies with 2 different partners that unfortunately both were not successful.  Her last pregnancy sounded to be a blighted ovum and she had a Cytotec induced AB in October 2020 per previous documentation.  She does report taking a daily prenatal vitamin.  She has not been on birth control for over a year.  She indicates that she is with the same partner as her last pregnancy and has not been able to conceive.  Her partner does have a 44-year-old child from a different relationship.  Current Outpatient Medications  Medication Sig Dispense Refill  . Prenatal Vit-Fe Fumarate-FA (PRENATAL VITAMIN PO) Take 1 tablet by mouth daily.     No current facility-administered medications for this visit.   Allergies: Patient has no known allergies.  Patient's last menstrual period was 10/16/2019.  Past medical history,surgical history, problem list, medications, allergies, family history and social history were all reviewed and documented as reviewed in the EPIC chart.  ROS:  Feeling well. No dyspnea or chest pain on exertion.  No abdominal pain, change in bowel habits, black or bloody stools.  No urinary tract symptoms. GYN ROS: normal menses, no abnormal bleeding, pelvic pain or discharge, no breast pain or new or enlarging lumps on self exam. No neurological complaints.   OBJECTIVE:  BP 118/74   Ht 5\' 5"  (1.651 m)   Wt 190 lb (86.2 kg)   LMP 10/16/2019   BMI 31.62 kg/m  The patient appears well, alert, oriented x 3, in no distress. ENT normal.  Neck supple. No cervical or supraclavicular adenopathy or thyromegaly.  Lungs are clear, good air entry, no wheezes, rhonchi or rales. S1 and S2 normal, no murmurs,  regular rate and rhythm.  Abdomen soft without tenderness, guarding, mass or organomegaly.  Neurological is normal, no focal findings.  BREAST EXAM: breasts appear normal, no suspicious masses, no skin or nipple changes or axillary nodes  PELVIC EXAM: VULVA: normal appearing vulva with no masses, tenderness or lesions, VAGINA: normal appearing vagina with normal color and discharge, no lesions, CERVIX: normal appearing cervix without discharge or lesions, UTERUS: uterus is normal size, shape, consistency and nontender, ADNEXA: normal adnexa in size, nontender and no masses  Chaperone: Caryn Bee present during the examination  ASSESSMENT:  29 y.o. G2P0020 here for annual gynecologic exam  PLAN:   1. No menstrual or hormonal concerns.  2. Pap smear 10/2017 normal.  Indicates a remote history of abnormal Pap smear in the past.  Pap smears normal in 2017, 2018, 2019.  Next Pap smear due in 2022 following the guidelines recommending the 3-year cytology only interval. 3. Contraception.  None desired as she desires pregnancy. 4.  Infertility.  Recommend checking baseline labs since it has been over 1 year since she has been trying to conceive.  We will check FSH, LH, estradiol level, testosterone, prolactin.  I would like to have her back in a few weeks to discuss the results, and if they are normal, we will plan to get a pelvic ultrasound at that time to continue the work-up. 5. Normal breast exam.  Encouraged breast self awareness and notify us of any concerning changes. 6. Health maintenance.  Routine screening blood work (CBC, CMP, TSH) is ordered in  addition to the lab work mentioned above.  Return 1 month to discuss lab results and infertility  [name] MD, [facility]  10/23/19

## 2019-11-03 LAB — COMPREHENSIVE METABOLIC PANEL
AG Ratio: 1.4 (calc) (ref 1.0–2.5)
ALT: 8 U/L (ref 6–29)
AST: 18 U/L (ref 10–30)
Albumin: 4 g/dL (ref 3.6–5.1)
Alkaline phosphatase (APISO): 66 U/L (ref 31–125)
BUN/Creatinine Ratio: 10 (calc) (ref 6–22)
BUN: 6 mg/dL — ABNORMAL LOW (ref 7–25)
CO2: 24 mmol/L (ref 20–32)
Calcium: 8.8 mg/dL (ref 8.6–10.2)
Chloride: 105 mmol/L (ref 98–110)
Creat: 0.62 mg/dL (ref 0.50–1.10)
Globulin: 2.9 g/dL (calc) (ref 1.9–3.7)
Glucose, Bld: 89 mg/dL (ref 65–99)
Potassium: 4.1 mmol/L (ref 3.5–5.3)
Sodium: 136 mmol/L (ref 135–146)
Total Bilirubin: 0.2 mg/dL (ref 0.2–1.2)
Total Protein: 6.9 g/dL (ref 6.1–8.1)

## 2019-11-03 LAB — CBC WITH DIFFERENTIAL/PLATELET
Absolute Monocytes: 441 cells/uL (ref 200–950)
Basophils Absolute: 32 cells/uL (ref 0–200)
Basophils Relative: 0.7 %
Eosinophils Absolute: 72 cells/uL (ref 15–500)
Eosinophils Relative: 1.6 %
HCT: 35.4 % (ref 35.0–45.0)
Hemoglobin: 10.5 g/dL — ABNORMAL LOW (ref 11.7–15.5)
Lymphs Abs: 2151 cells/uL (ref 850–3900)
MCH: 20.6 pg — ABNORMAL LOW (ref 27.0–33.0)
MCHC: 29.7 g/dL — ABNORMAL LOW (ref 32.0–36.0)
MCV: 69.5 fL — ABNORMAL LOW (ref 80.0–100.0)
MPV: 9.2 fL (ref 7.5–12.5)
Monocytes Relative: 9.8 %
Neutro Abs: 1805 cells/uL (ref 1500–7800)
Neutrophils Relative %: 40.1 %
Platelets: 356 10*3/uL (ref 140–400)
RBC: 5.09 10*6/uL (ref 3.80–5.10)
RDW: 17.5 % — ABNORMAL HIGH (ref 11.0–15.0)
Total Lymphocyte: 47.8 %
WBC: 4.5 10*3/uL (ref 3.8–10.8)

## 2019-11-03 LAB — ESTRADIOL, FREE
Estradiol, Free: 3.62 pg/mL
Estradiol: 150 pg/mL

## 2019-11-03 LAB — FOLLICLE STIMULATING HORMONE: FSH: 5.8 m[IU]/mL

## 2019-11-03 LAB — LUTEINIZING HORMONE: LH: 8 m[IU]/mL

## 2019-11-03 LAB — TESTOS,TOTAL,FREE AND SHBG (FEMALE)
Free Testosterone: 4.2 pg/mL (ref 0.1–6.4)
Sex Hormone Binding: 54 nmol/L (ref 17–124)
Testosterone, Total, LC-MS-MS: 33 ng/dL (ref 2–45)

## 2019-11-03 LAB — TSH: TSH: 1.48 mIU/L

## 2019-11-03 LAB — PROLACTIN: Prolactin: 8.5 ng/mL

## 2019-11-14 ENCOUNTER — Other Ambulatory Visit: Payer: Self-pay

## 2019-11-15 ENCOUNTER — Ambulatory Visit: Payer: BC Managed Care – PPO | Admitting: Obstetrics and Gynecology

## 2019-11-15 ENCOUNTER — Encounter: Payer: Self-pay | Admitting: Obstetrics and Gynecology

## 2019-11-15 VITALS — BP 116/74

## 2019-11-15 DIAGNOSIS — N912 Amenorrhea, unspecified: Secondary | ICD-10-CM | POA: Diagnosis not present

## 2019-11-15 DIAGNOSIS — Z3201 Encounter for pregnancy test, result positive: Secondary | ICD-10-CM | POA: Diagnosis not present

## 2019-11-15 DIAGNOSIS — Z8759 Personal history of other complications of pregnancy, childbirth and the puerperium: Secondary | ICD-10-CM

## 2019-11-15 LAB — PREGNANCY, URINE: Preg Test, Ur: POSITIVE — AB

## 2019-11-15 MED ORDER — FERROUS SULFATE 324 (65 FE) MG PO TBEC: 1.0000 | DELAYED_RELEASE_TABLET | Freq: Every day | ORAL | 4 refills | Status: DC

## 2019-11-15 NOTE — Progress Notes (Signed)
   Allison Jones 09-Sep-1990 [id number]  SUBJECTIVE:  29 y.o. G3P0020 female with LMP 10/12/2019 presents for a positive pregnancy test at home.  She had initiated an infertility work-up with [place] when she was in the office here earlier in the month.  She has had a history of 2 miscarriages, her first miscarriage was with a different partner and the conceptus was reportedly with abnormal genetic testing.  Her last miscarriage with another partner resulted in what sounded to be a blighted ovum.  She currently has had only mild cramping, she denies any vaginal bleeding.  She is taking a daily prenatal vitamin.  She has not yet started iron supplements for anemia identified on her infertility work-up.   Current Outpatient Medications  Medication Sig Dispense Refill  . Prenatal Vit-Fe Fumarate-FA (PRENATAL VITAMIN PO) Take 1 tablet by mouth daily.     No current facility-administered medications for this visit.   Allergies: Patient has no known allergies.  Patient's last menstrual period was 10/12/2019.  Past medical history,surgical history, problem list, medications, allergies, family history and social history were all reviewed and documented as reviewed in the EPIC chart.   OBJECTIVE:  BP 116/74   LMP 10/12/2019  The patient appears well, alert, oriented x 3, in no distress. PELVIC EXAM: Deferred  Urine pregnancy test confirmed positive in clinic today  ASSESSMENT:  29 y.o. G3P0020 here for confirmation of positive pregnancy test  PLAN:  Congratulations are offered. The patient is encouraged to continue taking a daily prenatal vitamin and to start a ferrous sulfate tablet once daily, common side effects are reviewed. Beta hCG is ordered and collected today so we can trend numbers given her pregnancy loss history.  She will be unable to return in 48 hours due to her work schedule, so I told her that we can just collect another beta hCG this next week. We will have her get a  transvaginal OB ultrasound in 1 to 2 weeks.  We will schedule this when checking out today. She is encouraged to select an obstetrician and can start now working on making an appointment for her initial prenatal care visits.  She was told that she should contact the genetic counselor with a future pregnancy given the results from her first pregnancy and the reportedly abnormal genetic results. I did provide her with a work note to avoid heavy lifting for the next month of greater than 20 pounds.  Also encouraged pelvic rest until her ultrasound is performed.  [name] MD 11/15/19

## 2019-11-16 LAB — HCG, QUANTITATIVE, PREGNANCY: HCG, Total, QN: 2994 m[IU]/mL

## 2019-11-19 ENCOUNTER — Other Ambulatory Visit: Payer: BC Managed Care – PPO

## 2019-11-19 ENCOUNTER — Other Ambulatory Visit: Payer: Self-pay

## 2019-11-19 DIAGNOSIS — Z8759 Personal history of other complications of pregnancy, childbirth and the puerperium: Secondary | ICD-10-CM

## 2019-11-19 DIAGNOSIS — Z3201 Encounter for pregnancy test, result positive: Secondary | ICD-10-CM | POA: Diagnosis not present

## 2019-11-20 LAB — HCG, QUANTITATIVE, PREGNANCY: HCG, Total, QN: 10847 m[IU]/mL

## 2019-11-28 ENCOUNTER — Other Ambulatory Visit: Payer: Self-pay

## 2019-11-29 ENCOUNTER — Ambulatory Visit: Payer: BC Managed Care – PPO | Admitting: Obstetrics and Gynecology

## 2019-11-29 ENCOUNTER — Ambulatory Visit (INDEPENDENT_AMBULATORY_CARE_PROVIDER_SITE_OTHER): Payer: BC Managed Care – PPO

## 2019-11-29 ENCOUNTER — Encounter: Payer: Self-pay | Admitting: Obstetrics and Gynecology

## 2019-11-29 VITALS — BP 118/74

## 2019-11-29 DIAGNOSIS — Z3201 Encounter for pregnancy test, result positive: Secondary | ICD-10-CM

## 2019-11-29 DIAGNOSIS — O3680X1 Pregnancy with inconclusive fetal viability, fetus 1: Secondary | ICD-10-CM | POA: Diagnosis not present

## 2019-11-29 DIAGNOSIS — Z3A01 Less than 8 weeks gestation of pregnancy: Secondary | ICD-10-CM

## 2019-11-29 DIAGNOSIS — Z8759 Personal history of other complications of pregnancy, childbirth and the puerperium: Secondary | ICD-10-CM | POA: Diagnosis not present

## 2019-11-29 DIAGNOSIS — O2621 Pregnancy care for patient with recurrent pregnancy loss, first trimester: Secondary | ICD-10-CM

## 2019-11-29 NOTE — Progress Notes (Signed)
   Allison Jones Oct 26, 1990 [id number]  SUBJECTIVE:  29 y.o. G59P0020 female presents for a pregnancy viability ultrasound.  LMP 10/12/2019.  She had a positive pregnancy test at home and we had her come in to the office 11/15/2019 for confirmation of a positive UPT.  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  She has had mild cramping at this point.  No vaginal bleeding.  She has taken daily prenatal vitamin and also started iron supplements for anemia that was identified on her routine infertility work-up.  No morning sickness at this point.  Current Outpatient Medications  Medication Sig Dispense Refill  . ferrous sulfate 324 (65 Fe) MG TBEC Take 1 tablet (325 mg total) by mouth daily. 90 tablet 4  . Prenatal Vit-Fe Fumarate-FA (PRENATAL VITAMIN PO) Take 1 tablet by mouth daily.     No current facility-administered medications for this visit.   Allergies: Patient has no known allergies.  Patient's last menstrual period was 10/12/2019.  Past medical history,surgical history, problem list, medications, allergies, family history and social history were all reviewed and documented as reviewed in the EPIC chart.  OBJECTIVE:  BP 118/74   LMP 10/12/2019  Transvaginal ultrasound indicates single live intrauterine pregnancy, CRL 9.6 mm, consistent with 6 weeks 6 days, [facility] 07/18/2020.  FHR 129 bpm.  Normal yolk sac.  Cervix long and closed.  1.7 x 1.6 cm corpus luteum on left.  No free fluid in the pelvis.   ASSESSMENT:  29 y.o. G3P0020 at 6 weeks 6 days here for fetal viability ultrasound  PLAN:  The patient is congratulated and the results of the ultrasound are reviewed.  Final EDC 07/18/2020 is reviewed.  She is encouraged to continue taking the prenatal vitamin and iron supplement.  CBC will be rechecked when she gets her initial prenatal labs.  She is encouraged to initiate prenatal care and plans to make an appointment with her selected obstetrician.   [name] MD 11/29/19

## 2020-01-09 DIAGNOSIS — O3680X9 Pregnancy with inconclusive fetal viability, other fetus: Secondary | ICD-10-CM | POA: Diagnosis not present

## 2020-01-09 DIAGNOSIS — Z3A12 12 weeks gestation of pregnancy: Secondary | ICD-10-CM | POA: Diagnosis not present

## 2020-01-09 DIAGNOSIS — N925 Other specified irregular menstruation: Secondary | ICD-10-CM | POA: Diagnosis not present

## 2020-01-09 DIAGNOSIS — N96 Recurrent pregnancy loss: Secondary | ICD-10-CM | POA: Diagnosis not present

## 2020-01-09 DIAGNOSIS — Z3482 Encounter for supervision of other normal pregnancy, second trimester: Secondary | ICD-10-CM | POA: Diagnosis not present

## 2020-01-09 DIAGNOSIS — Z202 Contact with and (suspected) exposure to infections with a predominantly sexual mode of transmission: Secondary | ICD-10-CM | POA: Diagnosis not present

## 2020-01-10 LAB — OB RESULTS CONSOLE GC/CHLAMYDIA: Gonorrhea: NEGATIVE

## 2020-01-11 LAB — OB RESULTS CONSOLE ANTIBODY SCREEN: Antibody Screen: NEGATIVE

## 2020-01-11 LAB — OB RESULTS CONSOLE ABO/RH: RH Type: POSITIVE

## 2020-01-11 LAB — OB RESULTS CONSOLE HEPATITIS B SURFACE ANTIGEN: Hepatitis B Surface Ag: NEGATIVE

## 2020-02-01 DIAGNOSIS — N3001 Acute cystitis with hematuria: Secondary | ICD-10-CM | POA: Diagnosis not present

## 2020-02-01 DIAGNOSIS — N3091 Cystitis, unspecified with hematuria: Secondary | ICD-10-CM | POA: Diagnosis not present

## 2020-02-02 DIAGNOSIS — N760 Acute vaginitis: Secondary | ICD-10-CM | POA: Diagnosis not present

## 2020-02-02 DIAGNOSIS — B373 Candidiasis of vulva and vagina: Secondary | ICD-10-CM | POA: Diagnosis not present

## 2020-02-02 DIAGNOSIS — O26892 Other specified pregnancy related conditions, second trimester: Secondary | ICD-10-CM | POA: Diagnosis not present

## 2020-02-02 DIAGNOSIS — O23592 Infection of other part of genital tract in pregnancy, second trimester: Secondary | ICD-10-CM | POA: Diagnosis not present

## 2020-02-02 DIAGNOSIS — Z3A16 16 weeks gestation of pregnancy: Secondary | ICD-10-CM | POA: Diagnosis not present

## 2020-02-02 DIAGNOSIS — Z79899 Other long term (current) drug therapy: Secondary | ICD-10-CM | POA: Diagnosis not present

## 2020-02-06 DIAGNOSIS — Z202 Contact with and (suspected) exposure to infections with a predominantly sexual mode of transmission: Secondary | ICD-10-CM | POA: Diagnosis not present

## 2020-02-18 ENCOUNTER — Other Ambulatory Visit: Payer: Self-pay

## 2020-02-18 ENCOUNTER — Inpatient Hospital Stay (HOSPITAL_COMMUNITY)
Admission: AD | Admit: 2020-02-18 | Discharge: 2020-02-18 | Disposition: A | Discharge status: Home / Self Care | Payer: BC Managed Care – PPO | Attending: Obstetrics and Gynecology | Admitting: Obstetrics and Gynecology

## 2020-02-18 ENCOUNTER — Encounter (HOSPITAL_COMMUNITY): Payer: Self-pay | Admitting: Obstetrics and Gynecology

## 2020-02-18 DIAGNOSIS — R110 Nausea: Secondary | ICD-10-CM | POA: Diagnosis not present

## 2020-02-18 DIAGNOSIS — K5900 Constipation, unspecified: Secondary | ICD-10-CM | POA: Insufficient documentation

## 2020-02-18 DIAGNOSIS — R109 Unspecified abdominal pain: Secondary | ICD-10-CM | POA: Diagnosis not present

## 2020-02-18 DIAGNOSIS — O99612 Diseases of the digestive system complicating pregnancy, second trimester: Secondary | ICD-10-CM | POA: Diagnosis not present

## 2020-02-18 DIAGNOSIS — Z3A18 18 weeks gestation of pregnancy: Secondary | ICD-10-CM | POA: Diagnosis not present

## 2020-02-18 DIAGNOSIS — R1032 Left lower quadrant pain: Secondary | ICD-10-CM | POA: Insufficient documentation

## 2020-02-18 DIAGNOSIS — O26892 Other specified pregnancy related conditions, second trimester: Secondary | ICD-10-CM | POA: Diagnosis not present

## 2020-02-18 LAB — URINALYSIS, ROUTINE W REFLEX MICROSCOPIC
Bilirubin Urine: NEGATIVE
Glucose, UA: NEGATIVE mg/dL
Hgb urine dipstick: NEGATIVE
Ketones, ur: 5 mg/dL — AB
Nitrite: NEGATIVE
Protein, ur: NEGATIVE mg/dL
Specific Gravity, Urine: 1.006 (ref 1.005–1.030)
pH: 7 (ref 5.0–8.0)

## 2020-02-18 LAB — WET PREP, GENITAL
Clue Cells Wet Prep HPF POC: NONE SEEN
Sperm: NONE SEEN
Trich, Wet Prep: NONE SEEN
Yeast Wet Prep HPF POC: NONE SEEN

## 2020-02-18 MED ORDER — POLYETHYLENE GLYCOL 3350 17 G PO PACK: 17.0000 g | PACK | Freq: Every day | ORAL | 0 refills | Status: DC | PRN

## 2020-02-18 MED ORDER — ACETAMINOPHEN 325 MG PO TABS
650.0000 mg | ORAL_TABLET | Freq: Once | ORAL | Status: AC
  Administered 2020-02-18: 650 mg via ORAL
  Filled 2020-02-18: qty 2

## 2020-02-18 NOTE — MAU Note (Signed)
Presents with c/o pain in lower left abdomen that began today and pelvic pressure with walking.  Denies VB or LOF.

## 2020-02-18 NOTE — Discharge Instructions (Signed)
You were seen for concern of abdominal pain. Your baby had normal fetal heart tones. No concerns of infection on your pelvic exam.  We recommend that you drink plenty of water and eat plenty of fiber (fruits and vegetables) to prevent constipation. You may also take miralax daily as needed for constipation. You may take tylenol 650mg  every 6 hours as needed for pain.  Please call your prenatal clinic to schedule your anatomy scan ultrasound. Please follow-up with your prenatal appointment as previously scheduled or sooner if concern of vaginal bleeding, worsening abdominal pain, loss of fluid, contractions or other concerning symptoms.   Abdominal Pain During Pregnancy  Belly (abdominal) pain is common during pregnancy. There are many possible causes. Most of the time, it is not a serious problem. Other times, it can be a sign that something is wrong with the pregnancy. Always tell your doctor if you have belly pain. Follow these instructions at home:  Do not have sex or put anything in your vagina until your pain goes away completely.  Get plenty of rest until your pain gets better.  Drink enough fluid to keep your pee (urine) pale yellow.  Take over-the-counter and prescription medicines only as told by your doctor.  Keep all follow-up visits as told by your doctor. This is important. Contact a doctor if:  Your pain continues or gets worse after resting.  You have lower belly pain that: ? Comes and goes at regular times. ? Spreads to your back. ? Feels like menstrual cramps.  You have pain or burning when you pee (urinate). Get help right away if:  You have a fever or chills.  You have vaginal bleeding.  You are leaking fluid from your vagina.  You are passing tissue from your vagina.  You throw up (vomit) for more than 24 hours.  You have watery poop (diarrhea) for more than 24 hours.  Your baby is moving less than usual.  You feel very weak or faint.  You have  shortness of breath.  You have very bad pain in your upper belly. Summary  Belly (abdominal) pain is common during pregnancy. There are many possible causes.  If you have belly pain during pregnancy, tell your doctor right away.  Keep all follow-up visits as told by your doctor. This is important. This information is not intended to replace advice given to you by your health care provider. Make sure you discuss any questions you have with your health care provider. Document Revised: 10/16/2018 Document Reviewed: 09/30/2016 Elsevier Patient Education  2020 10/02/2016.   Second Trimester of Pregnancy The second trimester is from week 14 through week 27 (months 4 through 6). The second trimester is often a time when you feel your best. Your body has adjusted to being pregnant, and you begin to feel better physically. Usually, morning sickness has lessened or quit completely, you may have more energy, and you may have an increase in appetite. The second trimester is also a time when the fetus is growing rapidly. At the end of the sixth month, the fetus is about 9 inches long and weighs about 1 pounds. You will likely begin to feel the baby move (quickening) between 16 and 20 weeks of pregnancy. Body changes during your second trimester Your body continues to go through many changes during your second trimester. The changes vary from woman to woman.  Your weight will continue to increase. You will notice your lower abdomen bulging out.  You may begin to  get stretch marks on your hips, abdomen, and breasts.  You may develop headaches that can be relieved by medicines. The medicines should be approved by your health care provider.  You may urinate more often because the fetus is pressing on your bladder.  You may develop or continue to have heartburn as a result of your pregnancy.  You may develop constipation because certain hormones are causing the muscles that push waste through your  intestines to slow down.  You may develop hemorrhoids or swollen, bulging veins (varicose veins).  You may have back pain. This is caused by: ? Weight gain. ? Pregnancy hormones that are relaxing the joints in your pelvis. ? A shift in weight and the muscles that support your balance.  Your breasts will continue to grow and they will continue to become tender.  Your gums may bleed and may be sensitive to brushing and flossing.  Dark spots or blotches (chloasma, mask of pregnancy) may develop on your face. This will likely fade after the baby is born.  A dark line from your belly button to the pubic area (linea nigra) may appear. This will likely fade after the baby is born.  You may have changes in your hair. These can include thickening of your hair, rapid growth, and changes in texture. Some women also have hair loss during or after pregnancy, or hair that feels dry or thin. Your hair will most likely return to normal after your baby is born. What to expect at prenatal visits During a routine prenatal visit:  You will be weighed to make sure you and the fetus are growing normally.  Your blood pressure will be taken.  Your abdomen will be measured to track your baby's growth.  The fetal heartbeat will be listened to.  Any test results from the previous visit will be discussed. Your health care provider may ask you:  How you are feeling.  If you are feeling the baby move.  If you have had any abnormal symptoms, such as leaking fluid, bleeding, severe headaches, or abdominal cramping.  If you are using any tobacco products, including cigarettes, chewing tobacco, and electronic cigarettes.  If you have any questions. Other tests that may be performed during your second trimester include:  Blood tests that check for: ? Low iron levels (anemia). ? High blood sugar that affects pregnant women (gestational diabetes) between 1324 and 28 weeks. ? Rh antibodies. This is to check  for a protein on red blood cells (Rh factor).  Urine tests to check for infections, diabetes, or protein in the urine.  An ultrasound to confirm the proper growth and development of the baby.  An amniocentesis to check for possible genetic problems.  Fetal screens for spina bifida and Down syndrome.  HIV (human immunodeficiency virus) testing. Routine prenatal testing includes screening for HIV, unless you choose not to have this test. Follow these instructions at home: Medicines  Follow your health care provider's instructions regarding medicine use. Specific medicines may be either safe or unsafe to take during pregnancy.  Take a prenatal vitamin that contains at least 600 micrograms (mcg) of folic acid.  If you develop constipation, try taking a stool softener if your health care provider approves. Eating and drinking   Eat a balanced diet that includes fresh fruits and vegetables, whole grains, good sources of protein such as meat, eggs, or tofu, and low-fat dairy. Your health care provider will help you determine the amount of weight gain that is right  for you.  Avoid raw meat and uncooked cheese. These carry germs that can cause birth defects in the baby.  If you have low calcium intake from food, talk to your health care provider about whether you should take a daily calcium supplement.  Limit foods that are high in fat and processed sugars, such as fried and sweet foods.  To prevent constipation: ? Drink enough fluid to keep your urine clear or pale yellow. ? Eat foods that are high in fiber, such as fresh fruits and vegetables, whole grains, and beans. Activity  Exercise only as directed by your health care provider. Most women can continue their usual exercise routine during pregnancy. Try to exercise for 30 minutes at least 5 days a week. Stop exercising if you experience uterine contractions.  Avoid heavy lifting, wear low heel shoes, and practice good posture.  A  sexual relationship may be continued unless your health care provider directs you otherwise. Relieving pain and discomfort  Wear a good support bra to prevent discomfort from breast tenderness.  Take warm sitz baths to soothe any pain or discomfort caused by hemorrhoids. Use hemorrhoid cream if your health care provider approves.  Rest with your legs elevated if you have leg cramps or low back pain.  If you develop varicose veins, wear support hose. Elevate your feet for 15 minutes, 3-4 times a day. Limit salt in your diet. Prenatal Care  Write down your questions. Take them to your prenatal visits.  Keep all your prenatal visits as told by your health care provider. This is important. Safety  Wear your seat belt at all times when driving.  Make a list of emergency phone numbers, including numbers for family, friends, the hospital, and police and fire departments. General instructions  Ask your health care provider for a referral to a local prenatal education class. Begin classes no later than the beginning of month 6 of your pregnancy.  Ask for help if you have counseling or nutritional needs during pregnancy. Your health care provider can offer advice or refer you to specialists for help with various needs.  Do not use hot tubs, steam rooms, or saunas.  Do not douche or use tampons or scented sanitary pads.  Do not cross your legs for long periods of time.  Avoid cat litter boxes and soil used by cats. These carry germs that can cause birth defects in the baby and possibly loss of the fetus by miscarriage or stillbirth.  Avoid all smoking, herbs, alcohol, and unprescribed drugs. Chemicals in these products can affect the formation and growth of the baby.  Do not use any products that contain nicotine or tobacco, such as cigarettes and e-cigarettes. If you need help quitting, ask your health care provider.  Visit your dentist if you have not gone yet during your pregnancy. Use  a soft toothbrush to brush your teeth and be gentle when you floss. Contact a health care provider if:  You have dizziness.  You have mild pelvic cramps, pelvic pressure, or nagging pain in the abdominal area.  You have persistent nausea, vomiting, or diarrhea.  You have a bad smelling vaginal discharge.  You have pain when you urinate. Get help right away if:  You have a fever.  You are leaking fluid from your vagina.  You have spotting or bleeding from your vagina.  You have severe abdominal cramping or pain.  You have rapid weight gain or weight loss.  You have shortness of breath with chest  pain.  You notice sudden or extreme swelling of your face, hands, ankles, feet, or legs.  You have not felt your baby move in over an hour.  You have severe headaches that do not go away when you take medicine.  You have vision changes. Summary  The second trimester is from week 14 through week 27 (months 4 through 6). It is also a time when the fetus is growing rapidly.  Your body goes through many changes during pregnancy. The changes vary from woman to woman.  Avoid all smoking, herbs, alcohol, and unprescribed drugs. These chemicals affect the formation and growth your baby.  Do not use any tobacco products, such as cigarettes, chewing tobacco, and e-cigarettes. If you need help quitting, ask your health care provider.  Contact your health care provider if you have any questions. Keep all prenatal visits as told by your health care provider. This is important. This information is not intended to replace advice given to you by your health care provider. Make sure you discuss any questions you have with your health care provider. Document Revised: 10/20/2018 Document Reviewed: 08/03/2016 Elsevier Patient Education  2020 [employer].

## 2020-02-18 NOTE — MAU Provider Note (Signed)
Chief Complaint: Abdominal Pain  SUBJECTIVE HPI: Allison Jones is a 29 y.o. G3P0020 at [redacted]w[redacted]d by -/6 who presents to maternity admissions reporting 1 day of left-sided abdominal pain. Pt reports that she was working today when she developed left-sided abdominal pain described as sharp and stabbing, intermittent, and rated 7-8/10. Pain is worse with walking. No alleviating symptoms. Pt does report some constipation with her iron supplement in addition to mild nausea without vomiting.  Pt also reports pelvic pressure 2 weeks ago that has completely resolved. Next prenatal appointment scheduled for 03/08/20. No anatomy scan scheduled yet. Pt reports good adherence to daily prenatal vitamin and iron. She was also recently prescribed an antibiotic to take daily at [facility]. No known sick contacts. Minimal water intake.  She denies vaginal bleeding, vaginal itching/burning, urinary symptoms, h/a, dizziness, diarrhea, sore throat, cough or fever/chills.  HPI  Past Medical History:  Diagnosis Date  . H/O varicella   . Increased BMI 11/20/2009  . Wolff-Parkinson-White syndrome   . Yeast infection    Past Surgical History:  Procedure Laterality Date  . CARDIAC ELECTROPHYSIOLOGY STUDY AND ABLATION    . TONSILLECTOMY     Social History   Socioeconomic History  . Marital status: Single    Spouse name: Not on file  . Number of children: Not on file  . Years of education: Not on file  . Highest education level: Not on file  Occupational History  . Not on file  Tobacco Use  . Smoking status: Never Smoker  . Smokeless tobacco: Never Used  Vaping Use  . Vaping Use: Never used  Substance and Sexual Activity  . Alcohol use: Not Currently    Alcohol/week: 0.0 standard drinks  . Drug use: No  . Sexual activity: Yes    Birth control/protection: None    Comment: 1st intercourse 29 yo-Fewer than 5 partners  Other Topics Concern  . Not on file  Social History Narrative  . Not on file    Social Determinants of Health   Financial Resource Strain:   . Difficulty of Paying Living Expenses:   Food Insecurity:   . Worried About Programme researcher, broadcasting/film/video in the Last Year:   . Barista in the Last Year:   Transportation Needs:   . Freight forwarder (Medical):   Marland Kitchen Lack of Transportation (Non-Medical):   Physical Activity:   . Days of Exercise per Week:   . Minutes of Exercise per Session:   Stress:   . Feeling of Stress :   Social Connections:   . Frequency of Communication with Friends and Family:   . Frequency of Social Gatherings with Friends and Family:   . Attends Religious Services:   . Active Member of Clubs or Organizations:   . Attends Banker Meetings:   Marland Kitchen Marital Status:   Intimate Partner Violence:   . Fear of Current or Ex-Partner:   . Emotionally Abused:   Marland Kitchen Physically Abused:   . Sexually Abused:    No current facility-administered medications on file prior to encounter.   Current Outpatient Medications on File Prior to Encounter  Medication Sig Dispense Refill  . ferrous sulfate 324 (65 Fe) MG TBEC Take 1 tablet (325 mg total) by mouth daily. 90 tablet 4  . Prenatal Vit-Fe Fumarate-FA (PRENATAL VITAMIN PO) Take 1 tablet by mouth daily.     No Known Allergies  ROS:  Review of Systems  Constitutional: Negative for activity change, appetite change,  chills, fatigue and fever.  HENT: Negative for congestion and sore throat.   Eyes: Negative for photophobia and visual disturbance.  Respiratory: Negative for cough, chest tightness and shortness of breath.   Cardiovascular: Negative for chest pain.  Gastrointestinal: Positive for abdominal pain, constipation and nausea. Negative for abdominal distention, blood in stool, diarrhea and vomiting.  Genitourinary: Negative for difficulty urinating, dyspareunia, dysuria, flank pain, pelvic pain, vaginal bleeding, vaginal discharge and vaginal pain.  Musculoskeletal: Negative for back  pain.  Neurological: Negative for headaches.   I have reviewed patient's Past Medical Hx, Surgical Hx, Family Hx, Social Hx, medications and allergies.   Physical Exam   Patient Vitals for the past 24 hrs:  BP Temp Temp src Pulse Resp SpO2 Height Weight  02/18/20 1150 132/84 -- -- 82 17 -- -- --  02/18/20 1142 (!) 141/78 -- -- 83 18 98 % -- --  02/18/20 1125 136/75 97.9 F (36.6 C) Oral 81 20 97 % 5\' 5"  (1.651 m) 88 kg   Constitutional: Well-developed, well-nourished female in no acute distress.  Cardiovascular: normal rate Respiratory: normal effort GI: Abd soft, slight tenderness to palpation over LLQ. Pos BS x 4. No rebound or guarding. No CVA tenderness. MS: Extremities nontender, no edema, normal ROM Neurologic: Alert and oriented x 4.  GU: Neg CVAT.  PELVIC EXAM: Cervix pink, visually closed, without lesion, scant white creamy discharge, vaginal walls and external genitalia normal Bimanual exam: Cervix 0/long/high, firm, anterior, neg CMT, uterus nontender, nonenlarged, adnexa without tenderness, enlargement, or mass  FHT 164 by doppler  LAB RESULTS Results for orders placed or performed during the hospital encounter of 02/18/20 (from the past 24 hour(s))  Urinalysis, Routine w reflex microscopic Urine, Clean Catch     Status: Abnormal   Collection Time: 02/18/20 12:19 PM  Result Value Ref Range   Color, Urine YELLOW YELLOW   APPearance CLOUDY (A) CLEAR   Specific Gravity, Urine 1.006 1.005 - 1.030   pH 7.0 5.0 - 8.0   Glucose, UA NEGATIVE NEGATIVE mg/dL   Hgb urine dipstick NEGATIVE NEGATIVE   Bilirubin Urine NEGATIVE NEGATIVE   Ketones, ur 5 (A) NEGATIVE mg/dL   Protein, ur NEGATIVE NEGATIVE mg/dL   Nitrite NEGATIVE NEGATIVE   Leukocytes,Ua MODERATE (A) NEGATIVE   RBC / HPF 0-5 0 - 5 RBC/hpf   WBC, UA 11-20 0 - 5 WBC/hpf   Bacteria, UA RARE (A) NONE SEEN   Squamous Epithelial / LPF 11-20 0 - 5  Wet prep, genital     Status: Abnormal   Collection Time: 02/18/20  12:44 PM  Result Value Ref Range   Yeast Wet Prep HPF POC NONE SEEN NONE SEEN   Trich, Wet Prep NONE SEEN NONE SEEN   Clue Cells Wet Prep HPF POC NONE SEEN NONE SEEN   WBC, Wet Prep HPF POC MANY (A) NONE SEEN   Sperm NONE SEEN        IMAGING No results found.  MAU Management/MDM: Orders Placed This Encounter  Procedures  . Wet prep, genital  . Urinalysis, Routine w reflex microscopic Urine, Clean Catch  . Discharge patient    Meds ordered this encounter  Medications  . acetaminophen (TYLENOL) tablet 650 mg  . polyethylene glycol (MIRALAX / GLYCOLAX) 17 g packet    Sig: Take 17 g by mouth daily as needed for moderate constipation.    Dispense:  14 each    Refill:  0    Treatments in MAU included tylenol for abdominal  pain. Pt discharged with strict preterm labor precautions.  ASSESSMENT 1. Abdominal pain during pregnancy in second trimester: Pt presents given concern of sharp, intermittent LLQ abdominal pain that started while at work today. Pt is on her feet most of the day and reports that pain is worse with walking. Improved with tylenol in MAU. Overall reassuringly given prior confirmed IUP in second trimester, normal pelvic exam, unremarkable wet prep and improvement with tylenol in MAU. Most likely 2/2 constipation vs round ligament pain.   PLAN -provided reassurance given benign nature of abdominal pain; counseled on importance of good water and fiber intake to prevent constipation. -recommended use of tylenol as needed for abdominal pain in addition to belly band for additional support -instructed pt to call her prenatal clinic in regards to scheduling her anatomy scan given [place] -provided work note for MAU visit today -plan to f/u with prenatal provider on 8/28 as scheduled or sooner if concerns of worsening abdominal pain, persistent vomiting, inability to tolerate po intake, vaginal bleeding, loss of fluid, contractions, or other concerns -Discharge home with strict  return precautions as noted above  Allergies as of 02/18/2020   No Known Allergies     Medication List    TAKE these medications   ferrous sulfate 324 (65 Fe) MG Tbec Take 1 tablet (325 mg total) by mouth daily.   polyethylene glycol 17 g packet Commonly known as: MIRALAX / GLYCOLAX Take 17 g by mouth daily as needed for moderate constipation.   PRENATAL VITAMIN PO Take 1 tablet by mouth daily.        Lynnda Shields, MD OB Fellow, Faculty  02/18/2020  1:41 PM

## 2020-02-19 LAB — GC/CHLAMYDIA PROBE AMP (~~LOC~~) NOT AT ARMC
Chlamydia: NEGATIVE
Comment: NEGATIVE
Comment: NORMAL
Neisseria Gonorrhea: NEGATIVE

## 2020-02-25 DIAGNOSIS — Z8279 Family history of other congenital malformations, deformations and chromosomal abnormalities: Secondary | ICD-10-CM | POA: Diagnosis not present

## 2020-02-25 DIAGNOSIS — Z3A19 19 weeks gestation of pregnancy: Secondary | ICD-10-CM | POA: Diagnosis not present

## 2020-02-25 DIAGNOSIS — Z363 Encounter for antenatal screening for malformations: Secondary | ICD-10-CM | POA: Diagnosis not present

## 2020-04-02 DIAGNOSIS — Z3A25 25 weeks gestation of pregnancy: Secondary | ICD-10-CM | POA: Diagnosis not present

## 2020-04-02 DIAGNOSIS — Z362 Encounter for other antenatal screening follow-up: Secondary | ICD-10-CM | POA: Diagnosis not present

## 2020-05-01 DIAGNOSIS — Z23 Encounter for immunization: Secondary | ICD-10-CM | POA: Diagnosis not present

## 2020-05-01 DIAGNOSIS — Z369 Encounter for antenatal screening, unspecified: Secondary | ICD-10-CM | POA: Diagnosis not present

## 2020-05-02 LAB — OB RESULTS CONSOLE HIV ANTIBODY (ROUTINE TESTING): HIV: NONREACTIVE

## 2020-05-02 LAB — OB RESULTS CONSOLE RPR: RPR: NONREACTIVE

## 2020-05-12 DIAGNOSIS — R52 Pain, unspecified: Secondary | ICD-10-CM | POA: Diagnosis not present

## 2020-05-19 ENCOUNTER — Encounter: Payer: Self-pay | Admitting: Nurse Practitioner

## 2020-05-19 ENCOUNTER — Ambulatory Visit (INDEPENDENT_AMBULATORY_CARE_PROVIDER_SITE_OTHER): Payer: BC Managed Care – PPO | Admitting: Nurse Practitioner

## 2020-05-19 ENCOUNTER — Other Ambulatory Visit: Payer: Self-pay

## 2020-05-19 VITALS — BP 130/62 | HR 92 | Temp 97.5°F | Ht 64.0 in | Wt 203.0 lb

## 2020-05-19 DIAGNOSIS — Z0000 Encounter for general adult medical examination without abnormal findings: Secondary | ICD-10-CM | POA: Diagnosis not present

## 2020-05-19 NOTE — Patient Instructions (Signed)
Return in 1 year for annual physical or before if needed  Preventive Care 18-29 Years Old, Female Preventive care refers to visits with your health care provider and lifestyle choices that can promote health and wellness. This includes:  A yearly physical exam. This may also be called an annual well check.  Regular dental visits and eye exams.  Immunizations.  Screening for certain conditions.  Healthy lifestyle choices, such as eating a healthy diet, getting regular exercise, not using drugs or products that contain nicotine and tobacco, and limiting alcohol use. What can I expect for my preventive care visit? Physical exam Your health care provider will check your:  Height and weight. This may be used to calculate body mass index (BMI), which tells if you are at a healthy weight.  Heart rate and blood pressure.  Skin for abnormal spots. Counseling Your health care provider may ask you questions about your:  Alcohol, tobacco, and drug use.  Emotional well-being.  Home and relationship well-being.  Sexual activity.  Eating habits.  Work and work Statistician.  Method of birth control.  Menstrual cycle.  Pregnancy history. What immunizations do I need?  Influenza (flu) vaccine  This is recommended every year. Tetanus, diphtheria, and pertussis (Tdap) vaccine  You may need a Td booster every 10 years. Varicella (chickenpox) vaccine  You may need this if you have not been vaccinated. Human papillomavirus (HPV) vaccine  If recommended by your health care provider, you may need three doses over 6 months. Measles, mumps, and rubella (MMR) vaccine  You may need at least one dose of MMR. You may also need a second dose. Meningococcal conjugate (MenACWY) vaccine  One dose is recommended if you are age 27-21 years and a first-year college student living in a residence hall, or if you have one of several medical conditions. You may also need additional booster  doses. Pneumococcal conjugate (PCV13) vaccine  You may need this if you have certain conditions and were not previously vaccinated. Pneumococcal polysaccharide (PPSV23) vaccine  You may need one or two doses if you smoke cigarettes or if you have certain conditions. Hepatitis A vaccine  You may need this if you have certain conditions or if you travel or work in places where you may be exposed to hepatitis A. Hepatitis B vaccine  You may need this if you have certain conditions or if you travel or work in places where you may be exposed to hepatitis B. Haemophilus influenzae type b (Hib) vaccine  You may need this if you have certain conditions. You may receive vaccines as individual doses or as more than one vaccine together in one shot (combination vaccines). Talk with your health care provider about the risks and benefits of combination vaccines. What tests do I need?  Blood tests  Lipid and cholesterol levels. These may be checked every 5 years starting at age 70.  Hepatitis C test.  Hepatitis B test. Screening  Diabetes screening. This is done by checking your blood sugar (glucose) after you have not eaten for a while (fasting).  Sexually transmitted disease (STD) testing.  BRCA-related cancer screening. This may be done if you have a family history of breast, ovarian, tubal, or peritoneal cancers.  Pelvic exam and Pap test. This may be done every 3 years starting at age 51. Starting at age 70, this may be done every 5 years if you have a Pap test in combination with an HPV test. Talk with your health care provider about your  test results, treatment options, and if necessary, the need for more tests. Follow these instructions at home: Eating and drinking   Eat a diet that includes fresh fruits and vegetables, whole grains, lean protein, and low-fat dairy.  Take vitamin and mineral supplements as recommended by your health care provider.  Do not drink alcohol  if: ? Your health care provider tells you not to drink. ? You are pregnant, may be pregnant, or are planning to become pregnant.  If you drink alcohol: ? Limit how much you have to 0-1 drink a day. ? Be aware of how much alcohol is in your drink. In the [place] one drink equals one 12 oz bottle of beer (355 mL), one 5 oz glass of wine (148 mL), or one 1 oz glass of hard liquor (44 mL). Lifestyle  Take daily care of your teeth and gums.  Stay active. Exercise for at least 30 minutes on 5 or more days each week.  Do not use any products that contain nicotine or tobacco, such as cigarettes, e-cigarettes, and chewing tobacco. If you need help quitting, ask your health care provider.  If you are sexually active, practice safe sex. Use a condom or other form of birth control (contraception) in order to prevent pregnancy and STIs (sexually transmitted infections). If you plan to become pregnant, see your health care provider for a preconception visit. What's next?  Visit your health care provider once a year for a well check visit.  Ask your health care provider how often you should have your eyes and teeth checked.  Stay up to date on all vaccines. This information is not intended to replace advice given to you by your health care provider. Make sure you discuss any questions you have with your health care provider. Document Revised: 03/09/2018 Document Reviewed: 03/09/2018 Elsevier Patient Education  [place] Maintenance, Female Adopting a healthy lifestyle and getting preventive care are important in promoting health and wellness. Ask your health care provider about:  The right schedule for you to have regular tests and exams.  Things you can do on your own to prevent diseases and keep yourself healthy. What should I know about diet, weight, and exercise? Eat a healthy diet   Eat a diet that includes plenty of vegetables, fruits, low-fat dairy products, and lean  protein.  Do not eat a lot of foods that are high in solid fats, added sugars, or sodium. Maintain a healthy weight Body mass index (BMI) is used to identify weight problems. It estimates body fat based on height and weight. Your health care provider can help determine your BMI and help you achieve or maintain a healthy weight. Get regular exercise Get regular exercise. This is one of the most important things you can do for your health. Most adults should:  Exercise for at least 150 minutes each week. The exercise should increase your heart rate and make you sweat (moderate-intensity exercise).  Do strengthening exercises at least twice a week. This is in addition to the moderate-intensity exercise.  Spend less time sitting. Even light physical activity can be beneficial. Watch cholesterol and blood lipids Have your blood tested for lipids and cholesterol at 29 years of age, then have this test every 5 years. Have your cholesterol levels checked more often if:  Your lipid or cholesterol levels are high.  You are older than 29 years of age.  You are at high risk for heart disease. What should I know about cancer  screening? Depending on your health history and family history, you may need to have cancer screening at various ages. This may include screening for:  Breast cancer.  Cervical cancer.  Colorectal cancer.  Skin cancer.  Lung cancer. What should I know about heart disease, diabetes, and high blood pressure? Blood pressure and heart disease  High blood pressure causes heart disease and increases the risk of stroke. This is more likely to develop in people who have high blood pressure readings, are of African descent, or are overweight.  Have your blood pressure checked: ? Every 3-5 years if you are 74-18 years of age. ? Every year if you are 50 years old or older. Diabetes Have regular diabetes screenings. This checks your fasting blood sugar level. Have the screening  done:  Once every three years after age 67 if you are at a normal weight and have a low risk for diabetes.  More often and at a younger age if you are overweight or have a high risk for diabetes. What should I know about preventing infection? Hepatitis B If you have a higher risk for hepatitis B, you should be screened for this virus. Talk with your health care provider to find out if you are at risk for hepatitis B infection. Hepatitis C Testing is recommended for:  Everyone born from [birth year] through 1965.  Anyone with known risk factors for hepatitis C. Sexually transmitted infections (STIs)  Get screened for STIs, including gonorrhea and chlamydia, if: ? You are sexually active and are younger than 29 years of age. ? You are older than 29 years of age and your health care provider tells you that you are at risk for this type of infection. ? Your sexual activity has changed since you were last screened, and you are at increased risk for chlamydia or gonorrhea. Ask your health care provider if you are at risk.  Ask your health care provider about whether you are at high risk for HIV. Your health care provider may recommend a prescription medicine to help prevent HIV infection. If you choose to take medicine to prevent HIV, you should first get tested for HIV. You should then be tested every 3 months for as long as you are taking the medicine. Pregnancy  If you are about to stop having your period (premenopausal) and you may become pregnant, seek counseling before you get pregnant.  Take 400 to 800 micrograms (mcg) of folic acid every day if you become pregnant.  Ask for birth control (contraception) if you want to prevent pregnancy. Osteoporosis and menopause Osteoporosis is a disease in which the bones lose minerals and strength with aging. This can result in bone fractures. If you are 29 years old or older, or if you are at risk for osteoporosis and fractures, ask your health care  provider if you should:  Be screened for bone loss.  Take a calcium or vitamin D supplement to lower your risk of fractures.  Be given hormone replacement therapy (HRT) to treat symptoms of menopause. Follow these instructions at home: Lifestyle  Do not use any products that contain nicotine or tobacco, such as cigarettes, e-cigarettes, and chewing tobacco. If you need help quitting, ask your health care provider.  Do not use street drugs.  Do not share needles.  Ask your health care provider for help if you need support or information about quitting drugs. Alcohol use  Do not drink alcohol if: ? Your health care provider tells you not to drink. ?  You are pregnant, may be pregnant, or are planning to become pregnant.  If you drink alcohol: ? Limit how much you use to 0-1 drink a day. ? Limit intake if you are breastfeeding.  Be aware of how much alcohol is in your drink. In the [place] one drink equals one 12 oz bottle of beer (355 mL), one 5 oz glass of wine (148 mL), or one 1 oz glass of hard liquor (44 mL). General instructions  Schedule regular health, dental, and eye exams.  Stay current with your vaccines.  Tell your health care provider if: ? You often feel depressed. ? You have ever been abused or do not feel safe at home. Summary  Adopting a healthy lifestyle and getting preventive care are important in promoting health and wellness.  Follow your health care provider's instructions about healthy diet, exercising, and getting tested or screened for diseases.  Follow your health care provider's instructions on monitoring your cholesterol and blood pressure. This information is not intended to replace advice given to you by your health care provider. Make sure you discuss any questions you have with your health care provider. Document Revised: 06/21/2018 Document Reviewed: 06/21/2018 Elsevier Patient Education  2020 [employer].

## 2020-05-19 NOTE — Progress Notes (Signed)
Subjective:  Patient ID: Allison Jones, female    DOB: 12/18/1990  Age: 29 y.o. MRN: [id number]  Chief Complaint  Patient presents with  . Annual Exam    Work Physical    HPI  Ewelina is a 29 year old AA female that presents for annual work physical. She is 29 weeks 6 days pregnant. She received pap smear and flu immunization at ob/gyn office. Has a history of bilateral hearing loss and seasonal allergies.  Well Adult Physical: Patient here for a comprehensive physical exam.The patient reports current sinus drainage and congestion Do you take any herbs or supplements that were not prescribed by a doctor? no Are you taking calcium supplements? no Are you taking aspirin daily? no  Encounter for general adult medical examination without abnormal findings  Physical ("At Risk" items are starred): Patient's last physical exam was 1 year ago .  Smoking: Life-long non-smoker ;  Physical Activity:Not currently exercising  Alcohol/Drug Use: Is a non-drinker ; No illicit drug use ;  Patient is not afflicted from Stress Incontinence and Urge Incontinence  Safety: reviewed. Patient wears a seat belt, has smoke detectors, has carbon monoxide detectors, practices appropriate gun safety, and wears sunscreen with extended sun exposure. Dental Care: biannual cleanings, brushes and flosses daily. Ophthalmology/Optometry:none Hearing loss: bilateral hearing loss Vision impairments: none      Office Visit from 05/19/2020 in [facility]  PHQ-2 Total Score 0              Social Hx   Social History   Socioeconomic History  . Marital status: Single    Spouse name: Not on file  . Number of children: Not on file  . Years of education: Not on file  . Highest education level: Not on file  Occupational History  . Not on file  Tobacco Use  . Smoking status: Never Smoker  . Smokeless tobacco: Never Used  Vaping Use  . Vaping Use: Never used  Substance and Sexual Activity  . Alcohol  use: Not Currently    Alcohol/week: 0.0 standard drinks  . Drug use: No  . Sexual activity: Yes    Birth control/protection: None    Comment: 1st intercourse 29 yo-Fewer than 5 partners  Other Topics Concern  . Not on file  Social History Narrative  . Not on file   Social Determinants of Health   Financial Resource Strain:   . Difficulty of Paying Living Expenses: Not on file  Food Insecurity:   . Worried About Programme researcher, broadcasting/film/video in the Last Year: Not on file  . Ran Out of Food in the Last Year: Not on file  Transportation Needs:   . Lack of Transportation (Medical): Not on file  . Lack of Transportation (Non-Medical): Not on file  Physical Activity:   . Days of Exercise per Week: Not on file  . Minutes of Exercise per Session: Not on file  Stress:   . Feeling of Stress : Not on file  Social Connections:   . Frequency of Communication with Friends and Family: Not on file  . Frequency of Social Gatherings with Friends and Family: Not on file  . Attends Religious Services: Not on file  . Active Member of Clubs or Organizations: Not on file  . Attends Banker Meetings: Not on file  . Marital Status: Not on file   Past Medical History:  Diagnosis Date  . H/O varicella   . Increased BMI 11/20/2009  . Wolff-Parkinson-White syndrome   .  Yeast infection    Past Surgical History:  Procedure Laterality Date  . CARDIAC ELECTROPHYSIOLOGY STUDY AND ABLATION    . TONSILLECTOMY      Family History  Problem Relation Age of Onset  . Diabetes Maternal Grandmother   . Heart disease Maternal Grandmother   . Hypertension Father   . Diabetes Mother   . Hypertension Mother   . Diabetes Maternal Aunt   . Diabetes Maternal Aunt     Review of Systems  Constitutional: Negative for fatigue and fever.  HENT: Positive for hearing loss, postnasal drip, rhinorrhea and sinus pressure. Negative for congestion, ear pain and sore throat.   Eyes: Negative for pain.  Respiratory:  Negative for cough, chest tightness, shortness of breath and wheezing.   Cardiovascular: Negative for chest pain and palpitations.  Gastrointestinal: Negative for abdominal pain, constipation, diarrhea, nausea and vomiting.  Endocrine: Negative for cold intolerance, heat intolerance, polydipsia, polyphagia and polyuria.  Genitourinary: Negative for dysuria and hematuria.  Musculoskeletal: Negative for arthralgias, back pain, joint swelling and myalgias.  Skin: Negative for rash.  Allergic/Immunologic: Positive for environmental allergies.  Neurological: Negative for dizziness, weakness and headaches.  Psychiatric/Behavioral: Negative for dysphoric mood. The patient is not nervous/anxious.      Objective:  BP 130/62 (BP Location: Left Arm, Patient Position: Sitting)   Pulse 92   Temp (!) 97.5 F (36.4 C) (Temporal)   Ht 5\' 4"  (1.626 m)   Wt 203 lb (92.1 kg)   LMP 10/16/2019   SpO2 99%   BMI 34.84 kg/m   BP/Weight 05/19/2020 02/18/2020 11/29/2019  Systolic BP 130 123 118  Diastolic BP 62 72 74  Wt. (Lbs) 203 193.9 -  BMI 34.84 32.27 -    Physical Exam Vitals reviewed.  Constitutional:      Appearance: Normal appearance.  HENT:     Head: Normocephalic.     Right Ear: Tympanic membrane, ear canal and external ear normal.     Left Ear: Tympanic membrane, ear canal and external ear normal.     Nose: Congestion and rhinorrhea present.     Mouth/Throat:     Mouth: Mucous membranes are moist.  Eyes:     Pupils: Pupils are equal, round, and reactive to light.  Cardiovascular:     Rate and Rhythm: Normal rate and regular rhythm.     Pulses: Normal pulses.     Heart sounds: Normal heart sounds.  Pulmonary:     Effort: Pulmonary effort is normal.     Breath sounds: Normal breath sounds.  Abdominal:     General: Bowel sounds are normal.     Palpations: Abdomen is soft.     Tenderness: There is no abdominal tenderness.     Comments: 31 weeks 6 days gestation  Genitourinary:     Comments: Deferred Musculoskeletal:        General: Normal range of motion.     Cervical back: Normal range of motion and neck supple.  Skin:    General: Skin is warm and dry.     Capillary Refill: Capillary refill takes less than 2 seconds.  Neurological:     General: No focal deficit present.     Mental Status: She is alert and oriented to person, place, and time.  Psychiatric:        Mood and Affect: Mood normal.        Behavior: Behavior normal.        Thought Content: Thought content normal.  Judgment: Judgment normal.     Lab Results  Component Value Date   WBC 4.5 10/23/2019   HGB 10.5 (L) 10/23/2019   HCT 35.4 10/23/2019   PLT 356 10/23/2019   GLUCOSE 89 10/23/2019   ALT 8 10/23/2019   AST 18 10/23/2019   NA 136 10/23/2019   K 4.1 10/23/2019   CL 105 10/23/2019   CREATININE 0.62 10/23/2019   BUN 6 (L) 10/23/2019   CO2 24 10/23/2019   TSH 1.48 10/23/2019      Assessment & Plan:  1. Encounter for general adult medical examination without abnormal findings    Body mass index is 34.84 kg/m.   These are the goals we discussed: Goals   Continue heart healthy eating      This is a list of the screening recommended for you and due dates:  Health Maintenance  Topic Date Due  . COVID-19 Vaccine (1) Never done  . Tetanus Vaccine  Never done  . Pap Smear  10/20/2020  . Pap Smear  10/20/2020  . Flu Shot  Completed  .  Hepatitis C: One time screening is recommended by Center for Disease Control  (CDC) for  adults born from [birth year] through 1965.   Completed  . HIV Screening  Completed      1. Encounter for general adult medical examination without abnormal findings - CBC with Differential - Comprehensive metabolic panel - TSH   AN INDIVIDUALIZED CARE PLAN: was established or reinforced today.   SELF MANAGEMENT: The patient and I together assessed ways to personally work towards obtaining the recommended goals  Support needs The patient and/or family  needs were assessed and services were offered if appropriate.     Follow-up: 1 year or before as needed  An After Visit Summary was printed and given to the patient.  Flonnie Hailstone, DNP [facility] [phone]

## 2020-05-20 LAB — CBC WITH DIFFERENTIAL/PLATELET
Basophils Absolute: 0 10*3/uL (ref 0.0–0.2)
Basos: 0 %
EOS (ABSOLUTE): 0.1 10*3/uL (ref 0.0–0.4)
Eos: 1 %
Hematocrit: 39.5 % (ref 34.0–46.6)
Hemoglobin: 13.2 g/dL (ref 11.1–15.9)
Immature Grans (Abs): 0 10*3/uL (ref 0.0–0.1)
Immature Granulocytes: 0 %
Lymphocytes Absolute: 1.5 10*3/uL (ref 0.7–3.1)
Lymphs: 25 %
MCH: 27.3 pg (ref 26.6–33.0)
MCHC: 33.4 g/dL (ref 31.5–35.7)
MCV: 82 fL (ref 79–97)
Monocytes Absolute: 0.6 10*3/uL (ref 0.1–0.9)
Monocytes: 10 %
Neutrophils Absolute: 3.8 10*3/uL (ref 1.4–7.0)
Neutrophils: 64 %
Platelets: 210 10*3/uL (ref 150–450)
RBC: 4.84 x10E6/uL (ref 3.77–5.28)
RDW: 14.7 % (ref 11.7–15.4)
WBC: 5.9 10*3/uL (ref 3.4–10.8)

## 2020-05-20 LAB — COMPREHENSIVE METABOLIC PANEL
ALT: 8 IU/L (ref 0–32)
AST: 13 IU/L (ref 0–40)
Albumin/Globulin Ratio: 1.4 (ref 1.2–2.2)
Albumin: 3.4 g/dL — ABNORMAL LOW (ref 3.9–5.0)
Alkaline Phosphatase: 190 IU/L — ABNORMAL HIGH (ref 44–121)
BUN/Creatinine Ratio: 10 (ref 9–23)
BUN: 5 mg/dL — ABNORMAL LOW (ref 6–20)
Bilirubin Total: 0.2 mg/dL (ref 0.0–1.2)
CO2: 21 mmol/L (ref 20–29)
Calcium: 8.8 mg/dL (ref 8.7–10.2)
Chloride: 106 mmol/L (ref 96–106)
Creatinine, Ser: 0.49 mg/dL — ABNORMAL LOW (ref 0.57–1.00)
GFR calc Af Amer: 153 mL/min/{1.73_m2} (ref >59)
GFR calc non Af Amer: 133 mL/min/{1.73_m2} (ref >59)
Globulin, Total: 2.4 g/dL (ref 1.5–4.5)
Glucose: 118 mg/dL — ABNORMAL HIGH (ref 65–99)
Potassium: 3.9 mmol/L (ref 3.5–5.2)
Sodium: 139 mmol/L (ref 134–144)
Total Protein: 5.8 g/dL — ABNORMAL LOW (ref 6.0–8.5)

## 2020-05-20 LAB — TSH: TSH: 1.06 u[IU]/mL (ref 0.450–4.500)

## 2020-06-09 DIAGNOSIS — Z20828 Contact with and (suspected) exposure to other viral communicable diseases: Secondary | ICD-10-CM | POA: Diagnosis not present

## 2020-06-09 DIAGNOSIS — R051 Acute cough: Secondary | ICD-10-CM | POA: Diagnosis not present

## 2020-06-09 DIAGNOSIS — R438 Other disturbances of smell and taste: Secondary | ICD-10-CM | POA: Diagnosis not present

## 2020-06-10 ENCOUNTER — Telehealth (INDEPENDENT_AMBULATORY_CARE_PROVIDER_SITE_OTHER): Payer: BC Managed Care – PPO | Admitting: Nurse Practitioner

## 2020-06-10 ENCOUNTER — Encounter: Payer: Self-pay | Admitting: Nurse Practitioner

## 2020-06-10 VITALS — Ht 64.0 in | Wt 203.0 lb

## 2020-06-10 DIAGNOSIS — R432 Parageusia: Secondary | ICD-10-CM

## 2020-06-10 DIAGNOSIS — O98513 Other viral diseases complicating pregnancy, third trimester: Secondary | ICD-10-CM

## 2020-06-10 DIAGNOSIS — R430 Anosmia: Secondary | ICD-10-CM | POA: Diagnosis not present

## 2020-06-10 DIAGNOSIS — U071 COVID-19: Secondary | ICD-10-CM | POA: Diagnosis not present

## 2020-06-10 LAB — POC COVID19 BINAXNOW: SARS Coronavirus 2 Ag: POSITIVE — AB

## 2020-06-10 NOTE — Progress Notes (Signed)
Virtual Visit via Telephone Note   This visit type was conducted due to national recommendations for restrictions regarding the COVID-19 Pandemic (e.g. social distancing) in an effort to limit this patient's exposure and mitigate transmission in our community.  Due to her co-morbid illnesses, this patient is at least at moderate risk for complications without adequate follow up.  This format is felt to be most appropriate for this patient at this time.  The patient did not have access to video technology/had technical difficulties with video requiring transitioning to audio format only (telephone).  All issues noted in this document were discussed and addressed.  No physical exam could be performed with this format.  Patient verbally consented to a telehealth visit.   Date:  06/10/2020   ID:  Allison Jones, DOB 01-Feb-1991, MRN [id number]  Patient Location: Home Provider Location: Office/Clinic  PCP:  Blane Ohara, MD   Evaluation Performed:  Established patient, Acute visit  Chief Complaint: Loss of taste/smell  History of Present Illness:    Allison Jones is a 29 y.o. female with loss of taste and smell. Other symptoms include nasal congestion and mild cough. Denies fever, chills, body aches, nausea, vomiting. Onset was 4 days ago. She has not taken any medications. Allison Jones is 35-weeks pregnant. Pt has a history of Wolff-Parkinson-White syndrome; has undergone cardiac ablation.   The patient does have symptoms concerning for COVID-19 infection (fever, chills, cough, or new shortness of breath).    Past Medical History:  Diagnosis Date  . H/O varicella   . Increased BMI 11/20/2009  . Wolff-Parkinson-White syndrome   . Yeast infection     Past Surgical History:  Procedure Laterality Date  . CARDIAC ELECTROPHYSIOLOGY STUDY AND ABLATION    . TONSILLECTOMY      Family History  Problem Relation Age of Onset  . Diabetes Maternal Grandmother   . Heart disease Maternal  Grandmother   . Hypertension Father   . Diabetes Mother   . Hypertension Mother   . Diabetes Maternal Aunt   . Diabetes Maternal Aunt     Social History   Socioeconomic History  . Marital status: Single    Spouse name: Not on file  . Number of children: Not on file  . Years of education: Not on file  . Highest education level: Not on file  Occupational History  . Not on file  Tobacco Use  . Smoking status: Never Smoker  . Smokeless tobacco: Never Used  Vaping Use  . Vaping Use: Never used  Substance and Sexual Activity  . Alcohol use: Not Currently    Alcohol/week: 0.0 standard drinks  . Drug use: No  . Sexual activity: Yes    Birth control/protection: None    Comment: 1st intercourse 29 yo-Fewer than 5 partners  Other Topics Concern  . Not on file  Social History Narrative  . Not on file   Social Determinants of Health   Financial Resource Strain:   . Difficulty of Paying Living Expenses: Not on file  Food Insecurity:   . Worried About Programme researcher, broadcasting/film/video in the Last Year: Not on file  . Ran Out of Food in the Last Year: Not on file  Transportation Needs:   . Lack of Transportation (Medical): Not on file  . Lack of Transportation (Non-Medical): Not on file  Physical Activity:   . Days of Exercise per Week: Not on file  . Minutes of Exercise per Session: Not on file  Stress:   .  Feeling of Stress : Not on file  Social Connections:   . Frequency of Communication with Friends and Family: Not on file  . Frequency of Social Gatherings with Friends and Family: Not on file  . Attends Religious Services: Not on file  . Active Member of Clubs or Organizations: Not on file  . Attends Banker Meetings: Not on file  . Marital Status: Not on file  Intimate Partner Violence:   . Fear of Current or Ex-Partner: Not on file  . Emotionally Abused: Not on file  . Physically Abused: Not on file  . Sexually Abused: Not on file    Outpatient Medications Prior  to Visit  Medication Sig Dispense Refill  . ferrous sulfate 324 (65 Fe) MG TBEC Take 1 tablet (325 mg total) by mouth daily. 90 tablet 4  . Prenatal Vit-Fe Fumarate-FA (PRENATAL VITAMIN PO) Take 1 tablet by mouth daily.    . valACYclovir (VALTREX) 1000 MG tablet Take 1,000 mg by mouth 2 (two) times daily.     No facility-administered medications prior to visit.    Allergies:   Patient has no known allergies.   Social History   Tobacco Use  . Smoking status: Never Smoker  . Smokeless tobacco: Never Used  Vaping Use  . Vaping Use: Never used  Substance Use Topics  . Alcohol use: Not Currently    Alcohol/week: 0.0 standard drinks  . Drug use: No     Review of Systems  Constitutional: Negative for chills, fever and malaise/fatigue.  HENT: Positive for congestion. Negative for ear pain, sinus pain and sore throat.        Loss of taste and smell  Respiratory: Positive for cough. Negative for wheezing.   Cardiovascular: Negative for chest pain.  Gastrointestinal: Negative for abdominal pain, constipation, diarrhea, nausea and vomiting.  Genitourinary: Negative for frequency and urgency.  Musculoskeletal: Negative for joint pain and myalgias.  Neurological: Negative for headaches.     Labs/Other Tests and Data Reviewed:    Recent Labs: 05/19/2020: ALT 8; BUN 5; Creatinine, Ser 0.49; Hemoglobin 13.2; Platelets 210; Potassium 3.9; Sodium 139; TSH 1.060   Recent Lipid Panel No results found for: CHOL, TRIG, HDL, CHOLHDL, LDLCALC, LDLDIRECT  Wt Readings from Last 3 Encounters:  05/19/20 203 lb (92.1 kg)  02/18/20 193 lb 14.4 oz (88 kg)  10/23/19 190 lb (86.2 kg)     Objective:    Vital Signs:  LMP 10/16/2019    Physical Exam No physical exam due to COVID-19 telemed visit  ASSESSMENT & PLAN:     COVID-19 affecting pregnancy in third trimester -Monoclonal antibody infusion -Rest, push fluids -Repeat COVID-19 test  COVID-19 rapid swab positive today. Referral send to  [facility] for infusion. Pt instructed to quarantine at home. Pt verbalized understanding.  COVID-19 Education: The signs and symptoms of COVID-19 were discussed with the patient and how to seek care for testing (follow up with PCP or arrange E-visit). The importance of social distancing was discussed today.   I spent  2 minutes dedicated to the care of this patient on the date of this encounter.  Follow Up:  Virtual Visit   Signed,  Flonnie Hailstone, DNP  06/10/2020 1319    [facility] [place]

## 2020-06-11 ENCOUNTER — Other Ambulatory Visit: Payer: Self-pay | Admitting: Family

## 2020-06-11 ENCOUNTER — Telehealth (HOSPITAL_COMMUNITY): Payer: Self-pay

## 2020-06-11 DIAGNOSIS — U071 COVID-19: Secondary | ICD-10-CM

## 2020-06-11 LAB — SARS-COV-2, NAA 2 DAY TAT

## 2020-06-11 LAB — NOVEL CORONAVIRUS, NAA: SARS-CoV-2, NAA: DETECTED — AB

## 2020-06-11 NOTE — Progress Notes (Signed)
I connected by phone with Allison Jones on 06/11/2020 at 5:22 PM to discuss the potential use of a new treatment for mild to moderate COVID-19 viral infection in non-hospitalized patients.  This patient is a 29 y.o. female that meets the FDA criteria for Emergency Use Authorization of COVID monoclonal antibody casirivimab/imdevimab, bamlanivimab/eteseviamb, or sotrovimab.  Has a (+) direct SARS-CoV-2 viral test result  Has mild or moderate COVID-19   Is NOT hospitalized due to COVID-19  Is within 10 days of symptom onset  Has at least one of the high risk factor(s) for progression to severe COVID-19 and/or hospitalization as defined in EUA.  Specific high risk criteria : Pregnancy and Cardiovascular disease or hypertension   Symptoms of loss of taste/smell began 06/07/20.   I have spoken and communicated the following to the patient or parent/caregiver regarding COVID monoclonal antibody treatment:  1. FDA has authorized the emergency use for the treatment of mild to moderate COVID-19 in adults and pediatric patients with positive results of direct SARS-CoV-2 viral testing who are 29 years of age and older weighing at least 40 kg, and who are at high risk for progressing to severe COVID-19 and/or hospitalization.  2. The significant known and potential risks and benefits of COVID monoclonal antibody, and the extent to which such potential risks and benefits are unknown.  3. Information on available alternative treatments and the risks and benefits of those alternatives, including clinical trials.  4. Patients treated with COVID monoclonal antibody should continue to self-isolate and use infection control measures (e.g., wear mask, isolate, social distance, avoid sharing personal items, clean and disinfect "high touch" surfaces, and frequent handwashing) according to CDC guidelines.   5. The patient or parent/caregiver has the option to accept or refuse COVID monoclonal antibody  treatment.  After reviewing this information with the patient, the patient has agreed to receive one of the available covid 19 monoclonal antibodies and will be provided an appropriate fact sheet prior to infusion. Morton Stall, NP 06/11/2020 5:22 PM

## 2020-06-11 NOTE — Telephone Encounter (Signed)
Called patient to pre-screen for monoclonal antibody infusion after receiving recent positive test. Patient qualifies based off off co-morbid condition and/or member of an at risk group. Patient is [redacted] weeks pregnant.   Patient Active Problem List   Diagnosis Date Noted  . H/O cardiac radiofrequency ablation 05/09/2017  . History of Wolff-Parkinson-White (WPW) syndrome 05/09/2017  . Chest pain syndrome 05/09/2017  . Dysmenorrhea 03/09/2012  . Irregular menses 03/09/2012  . Abnormal urine odor 03/09/2012  . 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 RN forwarded information to APP's for additional screening/scheduling.   Kellon Chalk Loyola Mast, RN

## 2020-06-12 ENCOUNTER — Ambulatory Visit (HOSPITAL_COMMUNITY)
Admission: RE | Admit: 2020-06-12 | Discharge: 2020-06-12 | Disposition: A | Discharge status: Home / Self Care | Payer: BC Managed Care – PPO | Source: Ambulatory Visit | Attending: Pulmonary Disease | Admitting: Pulmonary Disease

## 2020-06-12 DIAGNOSIS — U071 COVID-19: Secondary | ICD-10-CM | POA: Insufficient documentation

## 2020-06-12 MED ORDER — EPINEPHRINE 0.3 MG/0.3ML IJ SOAJ: 0.3000 mg | Freq: Once | INTRAMUSCULAR | Status: DC | PRN

## 2020-06-12 MED ORDER — METHYLPREDNISOLONE SODIUM SUCC 125 MG IJ SOLR: 125.0000 mg | Freq: Once | INTRAMUSCULAR | Status: DC | PRN

## 2020-06-12 MED ORDER — SOTROVIMAB 500 MG/8ML IV SOLN
500.0000 mg | Freq: Once | INTRAVENOUS | Status: AC
  Administered 2020-06-12: 500 mg via INTRAVENOUS

## 2020-06-12 MED ORDER — SODIUM CHLORIDE 0.9 % IV SOLN: INTRAVENOUS | Status: DC | PRN

## 2020-06-12 MED ORDER — FAMOTIDINE IN NACL 20-0.9 MG/50ML-% IV SOLN: 20.0000 mg | Freq: Once | INTRAVENOUS | Status: DC | PRN

## 2020-06-12 MED ORDER — ALBUTEROL SULFATE HFA 108 (90 BASE) MCG/ACT IN AERS: 2.0000 | INHALATION_SPRAY | Freq: Once | RESPIRATORY_TRACT | Status: DC | PRN

## 2020-06-12 MED ORDER — DIPHENHYDRAMINE HCL 50 MG/ML IJ SOLN: 50.0000 mg | Freq: Once | INTRAMUSCULAR | Status: DC | PRN

## 2020-06-12 NOTE — Progress Notes (Signed)
Patient reviewed Fact Sheet for Patients, Parents, and Caregivers for Emergency Use Authorization (EUA) of Sotrovimab for the Treatment of Coronavirus. Patient also reviewed and is agreeable to the estimated cost of treatment. Patient is agreeable to proceed.   

## 2020-06-12 NOTE — Discharge Instructions (Signed)
10 Things You Can Do to Manage Your COVID-19 Symptoms at Home If you have possible or confirmed COVID-19: 1. Stay home from work and school. And stay away from other public places. If you must go out, avoid using any kind of public transportation, ridesharing, or taxis. 2. Monitor your symptoms carefully. If your symptoms get worse, call your healthcare provider immediately. 3. Get rest and stay hydrated. 4. If you have a medical appointment, call the healthcare provider ahead of time and tell them that you have or may have COVID-19. 5. For medical emergencies, call 911 and notify the dispatch personnel that you have or may have COVID-19. 6. Cover your cough and sneezes with a tissue or use the inside of your elbow. 7. Wash your hands often with soap and water for at least 20 seconds or clean your hands with an alcohol-based hand sanitizer that contains at least 60% alcohol. 8. As much as possible, stay in a specific room and away from other people in your home. Also, you should use a separate bathroom, if available. If you need to be around other people in or outside of the home, wear a mask. 9. Avoid sharing personal items with other people in your household, like dishes, towels, and bedding. 10. Clean all surfaces that are touched often, like counters, tabletops, and doorknobs. Use household cleaning sprays or wipes according to the label instructions. [place].co.ukcdc.gov/coronavirus 01/10/2019 This information is not intended to replace advice given to you by your health care provider. Make sure you discuss any questions you have with your health care provider. Document Revised: 06/14/2019 Document Reviewed: 06/14/2019 Elsevier Patient Education  2020 [employer] What types of side effects do monoclonal antibody drugs cause?  Common side effects  In general, the more common side effects caused by monoclonal antibody drugs include:  Allergic reactions, such as hives or itching  Flu-like signs and  symptoms, including chills, fatigue, fever, and muscle aches and pains  Nausea, vomiting  Diarrhea  Skin rashes  Low blood pressure   The CDC is recommending patients who receive monoclonal antibody treatments wait at least 90 days before being vaccinated.  Currently, there are no data on the safety and efficacy of mRNA COVID-19 vaccines in persons who received monoclonal antibodies or convalescent plasma as part of COVID-19 treatment. Based on the estimated half-life of such therapies as well as evidence suggesting that reinfection is uncommon in the 90 days after initial infection, vaccination should be deferred for at least 90 days, as a precautionary measure until additional information becomes available, to avoid interference of the antibody treatment with vaccine-induced immune responses. If you have any questions or concerns after the infusion please call the [facility] Provider on call at [phone]. This number is ONLY intended for your use regarding questions or concerns about the infusion post-treatment side-effects.  Please do not provide this number to others for use. For return to work notes please contact your primary care provider.   If someone you know is interested in receiving treatment please have them call the COVID hotline at [phone].   Pregnancy and COVID-19 Coronavirus disease, also called COVID-19, is an infection of the lungs and airways (respiratory tract). It is unclear at this time if pregnancy makes it more likely for you to get COVID-19, or what effects the infection may have on your unborn baby. However, pregnancy causes changes to your heart, lungs, and your body's disease-fighting system (immune system). Some of these changes make it more likely for  you to get sick and have more serious illness. Therefore, it is important for you to take precautions in order to protect yourself and your unborn baby. There have been studies showing that obesity and  diabetes may put you at higher risk for serious illness. If you are pregnant and are obese or have diabetes, you should take extra precautions to protect yourself from the virus. Work with your health care team to develop a plan to protect yourself from all infections, including COVID-19. This is one way for you to stay healthy during your pregnancy and to keep your baby healthy as well. How does this affect me? If you get COVID-19, there is a risk that you may:  Get a respiratory illness that can lead to pneumonia.  Give birth to your baby before 37 weeks of pregnancy (premature birth). If you have or may have COVID-19, your health care provider may recommend special precautions around your pregnancy. This may affect how you:  Receive care before delivery (prenatal care). How you visit your health care provider may change. Tests and scans may need to be performed differently.  Receive care during labor and delivery. This may affect your birth plan, including who may be with you during labor and delivery.  Receive care after you deliver your baby (postpartum care). You may stay longer in the hospital and in a special room.  Feed your baby after he or she is born. Pregnancy can be an especially stressful time because of the changes in your body and the preparation involved in becoming a parent. In addition, you may be feeling especially fearful, anxious, or stressed because of COVID-19 and how it is affecting you. How does this affect my baby? It is not known whether a mother will transmit the virus to her unborn baby. There is a risk that if you get COVID-19:  The virus that causes COVID-19 can pass to your baby.  You may have premature birth. Your baby may require more medical care if this happens. What can I do to lower my risk?  There is no vaccine to help prevent COVID-19. However, there are actions that you can take to protect yourself and others from this virus. Cleaning and personal  hygiene  Wash your hands often with soap and water for at least 20 seconds. If soap and water are not available, use alcohol-based hand sanitizer.  Avoid touching your mouth, face, eyes, or nose.  Clean and disinfect objects and surfaces that are frequently touched every day. These may include: ? Counters and tables. ? Doorknobs and light switches. ? Sinks and faucets. ? Electronics such as phones, remote controls, keyboards, computers, and tablets. Stay away from others  Stay away from people who are sick, if possible.  Avoid social gatherings and travel.  Stay home as much as possible. Follow these instructions: Breastfeeding It is not known if the virus that causes COVID-19 can pass through breast milk to your baby. You should make a plan for feeding your infant with your family and your health care team. If you have or may have COVID-19, your health care provider may recommend that you take precautions while breastfeeding, such as:  Washing your hands before feeding your baby.  Wearing a mask while feeding your baby.  Pumping or expressing breast milk to feed to your baby. If possible, ask someone in your household who is not sick to feed your baby the expressed breast milk. ? Wash your hands before touching pump parts. ?  Wash and disinfect all pump parts after expressing milk. Follow the manufacturer's instructions to clean and disinfect all pump parts. General instructions  If you think you have a COVID-19 infection, contact your health care provider right away. Tell your health care provider that you think you may have a COVID-19 infection.  Follow your health care provider's instructions on taking medicines. Some medicines may be unsafe to take during pregnancy.  Cover your mouth and nose by wearing a mask or other cloth covering over your face when you go out in public.  Find ways to manage stress. These may include: ? Using relaxation techniques like meditation and  deep breathing. ? Getting regular exercise. Most women can continue their usual exercise routine during pregnancy. Ask your health care provider what activities are safe for you. ? Seeking support from family, friends, or spiritual resources. If you cannot be together in person, you can still connect by phone calls, texts, video calls, or online messaging. ? Spending time doing relaxing activities that you enjoy, like listening to music or reading a good book.  Ask for help if you have counseling or nutritional needs during pregnancy. Your health care provider can offer advice or refer you to resources or specialists who can help you with various needs.  Keep all follow-up visits as told by your health care provider. This is important. Where to find more information Centers for Disease Control and Prevention (CDC): AffordableShare.com.br World Health Organization [facility]): PokerPortraits.es [employer] of [employer]ians [employer]necologists (ACOG): BuyDucts.dk Questions to ask your health care team  What should I do if I have COVID-19 symptoms?  How will COVID-19 affect my prenatal care visits, tests and scans, labor and delivery, and postpartum care?  Should I plan to breastfeed my baby?  Where can I find mental health resources?  Where can I find support if I have financial concerns? Contact a health care provider if:  You have signs and symptoms of infection, including a fever or cough. Tell your health care team that you think you may have a COVID-19 infection.  You have strong emotions, such as sadness or anxiety.  You feel unsafe in your home and need help finding a safe place to live.  You have bloody or watery vaginal discharge or vaginal bleeding. Get help right away if:  You have signs or symptoms of labor before 37 weeks  of pregnancy. These include: ? Contractions that are 5 minutes or less apart, or that increase in frequency, intensity, or length. ? Sudden, sharp pain in the abdomen or in the lower back. ? A gush or trickle of fluid from your vagina.  You have signs of more serious illness such as: ? You have difficulty breathing. ? You have chest pain. ? You have a fever greater than 102F (39C) or higher that does not go away. ? You cannot drink fluids without vomiting. ? You feel extremely weak or you faint. These symptoms may represent a serious problem that is an emergency. Do not wait to see if the symptoms will go away. Get medical help right away. Call your local emergency services (911 in the [place] Do not drive yourself to the hospital. Summary  Coronavirus disease, also called COVID-19, is an infection of the lungs and airways (respiratory tract). It is unclear at this time if pregnancy makes you more susceptible to COVID-19 and what effects it may have on unborn babies.  It is important to take precautions to protect yourself and your developing baby.  This includes washing your hands often, avoiding touching your mouth, face, eyes, or nose, avoiding social gatherings and travel, and staying away from people who are sick.  If you think you have a COVID-19 infection, contact your health care provider right away. Tell your health care provider that you think you may have a COVID-19 infection.  If you have or may have COVID-19, your health care provider may recommend special precautions during your pregnancy, labor and delivery, and after your baby is born. This information is not intended to replace advice given to you by your health care provider. Make sure you discuss any questions you have with your health care provider. Document Revised: 04/20/2019 Document Reviewed: 10/24/2018 Elsevier Patient Education  2020 [employer].

## 2020-06-12 NOTE — Progress Notes (Addendum)
Diagnosis: COVID-19  Physician: Dr. Shan Levans  Procedure: Cov[facility] Infusion Clinic Med: Sotrovimab infusion - Provided patient with sotrovimab fact sheet for patients, parents, and caregivers prior to infusion.   Complications: No immediate complications noted. BP 165/108 HR 80 T 98.3 R 20 O2 96% on RA. Notified Lillia Abed, NP in regards to patient BP. Per Lillia Abed notify Pt's OB regarding BP and orders. [facility] [phone] spoke with on call provider Victorino Dike. Per Victorino Dike recheck BP in 15 mins and she will notify her office to give patient a call to setup an appt for tomorrow 06/13/2020 with labs. Pt is aware that she needs to follow up with OB tomorrow. Recheck BP 153/102 HR 76 R 20 O2 98% RA.  Discharge: Discharged home

## 2020-06-20 ENCOUNTER — Encounter: Payer: Self-pay | Admitting: Nurse Practitioner

## 2020-06-21 ENCOUNTER — Other Ambulatory Visit: Payer: Self-pay

## 2020-06-21 ENCOUNTER — Inpatient Hospital Stay (HOSPITAL_COMMUNITY)
Admission: AD | Admit: 2020-06-21 | Discharge: 2020-06-23 | DRG: 807 | Disposition: A | Discharge status: Home / Self Care | Payer: BC Managed Care – PPO | Attending: Obstetrics & Gynecology | Admitting: Obstetrics & Gynecology

## 2020-06-21 ENCOUNTER — Encounter (HOSPITAL_COMMUNITY): Payer: Self-pay | Admitting: Obstetrics & Gynecology

## 2020-06-21 DIAGNOSIS — Z8616 Personal history of COVID-19: Secondary | ICD-10-CM | POA: Diagnosis not present

## 2020-06-21 DIAGNOSIS — Z3A36 36 weeks gestation of pregnancy: Secondary | ICD-10-CM

## 2020-06-21 LAB — RAPID HIV SCREEN (HIV 1/2 AB+AG)
HIV 1/2 Antibodies: NONREACTIVE
HIV-1 P24 Antigen - HIV24: NONREACTIVE

## 2020-06-21 LAB — CBC
HCT: 34.6 % — ABNORMAL LOW (ref 36.0–46.0)
Hemoglobin: 12.9 g/dL (ref 12.0–15.0)
MCH: 29.9 pg (ref 26.0–34.0)
MCHC: 37.3 g/dL — ABNORMAL HIGH (ref 30.0–36.0)
MCV: 80.3 fL (ref 80.0–100.0)
Platelets: 218 10*3/uL (ref 150–400)
RBC: 4.31 MIL/uL (ref 3.87–5.11)
RDW: 14.3 % (ref 11.5–15.5)
WBC: 9.6 10*3/uL (ref 4.0–10.5)
nRBC: 0.2 % (ref 0.0–0.2)

## 2020-06-21 LAB — TYPE AND SCREEN
ABO/RH(D): A POS
Antibody Screen: NEGATIVE

## 2020-06-21 MED ORDER — ERYTHROMYCIN 5 MG/GM OP OINT
TOPICAL_OINTMENT | OPHTHALMIC | Status: AC
  Filled 2020-06-21: qty 1

## 2020-06-21 MED ORDER — OXYCODONE HCL 5 MG PO TABS: 5.0000 mg | ORAL_TABLET | ORAL | Status: DC | PRN

## 2020-06-21 MED ORDER — OXYCODONE HCL 5 MG PO TABS: 10.0000 mg | ORAL_TABLET | ORAL | Status: DC | PRN

## 2020-06-21 MED ORDER — OXYTOCIN-SODIUM CHLORIDE 30-0.9 UT/500ML-% IV SOLN
INTRAVENOUS | Status: AC
  Filled 2020-06-21: qty 500

## 2020-06-21 MED ORDER — DIPHENHYDRAMINE HCL 25 MG PO CAPS: 25.0000 mg | ORAL_CAPSULE | Freq: Four times a day (QID) | ORAL | Status: DC | PRN

## 2020-06-21 MED ORDER — LACTATED RINGERS IV SOLN
500.0000 mL | INTRAVENOUS | Status: DC | PRN
Start: 2020-06-21 — End: 2020-06-21
  Administered 2020-06-21: 500 mL via INTRAVENOUS

## 2020-06-21 MED ORDER — ONDANSETRON HCL 4 MG/2ML IJ SOLN: 4.0000 mg | Freq: Four times a day (QID) | INTRAMUSCULAR | Status: DC | PRN

## 2020-06-21 MED ORDER — OXYTOCIN BOLUS FROM INFUSION
333.0000 mL | Freq: Once | INTRAVENOUS | Status: AC
  Administered 2020-06-21: 333 mL via INTRAVENOUS

## 2020-06-21 MED ORDER — ONDANSETRON HCL 4 MG/2ML IJ SOLN: 4.0000 mg | INTRAMUSCULAR | Status: DC | PRN

## 2020-06-21 MED ORDER — WITCH HAZEL-GLYCERIN EX PADS: 1.0000 "application " | MEDICATED_PAD | CUTANEOUS | Status: DC | PRN

## 2020-06-21 MED ORDER — PRENATAL MULTIVITAMIN CH
1.0000 | ORAL_TABLET | Freq: Every day | ORAL | Status: DC
  Administered 2020-06-22 – 2020-06-23 (×2): 1 via ORAL
  Filled 2020-06-21 (×2): qty 1

## 2020-06-21 MED ORDER — SIMETHICONE 80 MG PO CHEW: 80.0000 mg | CHEWABLE_TABLET | ORAL | Status: DC | PRN

## 2020-06-21 MED ORDER — TETANUS-DIPHTH-ACELL PERTUSSIS 5-2.5-18.5 LF-MCG/0.5 IM SUSY: 0.5000 mL | PREFILLED_SYRINGE | Freq: Once | INTRAMUSCULAR | Status: DC

## 2020-06-21 MED ORDER — LIDOCAINE HCL (PF) 1 % IJ SOLN
INTRAMUSCULAR | Status: AC
  Filled 2020-06-21: qty 30

## 2020-06-21 MED ORDER — ZOLPIDEM TARTRATE 5 MG PO TABS: 5.0000 mg | ORAL_TABLET | Freq: Every evening | ORAL | Status: DC | PRN

## 2020-06-21 MED ORDER — COCONUT OIL OIL: 1.0000 "application " | TOPICAL_OIL | Status: DC | PRN

## 2020-06-21 MED ORDER — BENZOCAINE-MENTHOL 20-0.5 % EX AERO
1.0000 "application " | INHALATION_SPRAY | CUTANEOUS | Status: DC | PRN
  Administered 2020-06-21: 1 via TOPICAL
  Filled 2020-06-21: qty 56

## 2020-06-21 MED ORDER — LACTATED RINGERS IV SOLN: INTRAVENOUS | Status: DC

## 2020-06-21 MED ORDER — ACETAMINOPHEN 325 MG PO TABS
650.0000 mg | ORAL_TABLET | ORAL | Status: DC | PRN
  Administered 2020-06-21 – 2020-06-22 (×2): 650 mg via ORAL
  Filled 2020-06-21 (×2): qty 2

## 2020-06-21 MED ORDER — OXYCODONE-ACETAMINOPHEN 5-325 MG PO TABS: 1.0000 | ORAL_TABLET | ORAL | Status: DC | PRN

## 2020-06-21 MED ORDER — OXYCODONE-ACETAMINOPHEN 5-325 MG PO TABS: 2.0000 | ORAL_TABLET | ORAL | Status: DC | PRN

## 2020-06-21 MED ORDER — SENNOSIDES-DOCUSATE SODIUM 8.6-50 MG PO TABS
2.0000 | ORAL_TABLET | ORAL | Status: DC
  Administered 2020-06-22: 2 via ORAL
  Filled 2020-06-21: qty 2

## 2020-06-21 MED ORDER — LIDOCAINE HCL (PF) 1 % IJ SOLN: 30.0000 mL | INTRAMUSCULAR | Status: DC | PRN

## 2020-06-21 MED ORDER — IBUPROFEN 600 MG PO TABS
600.0000 mg | ORAL_TABLET | Freq: Four times a day (QID) | ORAL | Status: DC
  Administered 2020-06-21 – 2020-06-23 (×8): 600 mg via ORAL
  Filled 2020-06-21 (×8): qty 1

## 2020-06-21 MED ORDER — ONDANSETRON HCL 4 MG PO TABS: 4.0000 mg | ORAL_TABLET | ORAL | Status: DC | PRN

## 2020-06-21 MED ORDER — ACETAMINOPHEN 325 MG PO TABS: 650.0000 mg | ORAL_TABLET | ORAL | Status: DC | PRN

## 2020-06-21 MED ORDER — OXYTOCIN-SODIUM CHLORIDE 30-0.9 UT/500ML-% IV SOLN: 2.5000 [IU]/h | INTRAVENOUS | Status: DC

## 2020-06-21 MED ORDER — SOD CITRATE-CITRIC ACID 500-334 MG/5ML PO SOLN: 30.0000 mL | ORAL | Status: DC | PRN

## 2020-06-21 MED ORDER — DIBUCAINE (PERIANAL) 1 % EX OINT: 1.0000 "application " | TOPICAL_OINTMENT | CUTANEOUS | Status: DC | PRN

## 2020-06-21 NOTE — H&P (Signed)
OB ADMISSION/ HISTORY & PHYSICAL:  Admission Date: 06/21/2020  8:00 AM  Admit Diagnosis: Spontaneous labor  Allison Jones is a 29 y.o. female G65P0020 [redacted]w[redacted]d presenting for active labor. Endorses active FM, denies LOF and vaginal bleeding. Ctx began last night.   History of current pregnancy: G3P0020   Primary OB Provider: [facility] Patient entered care with [facility] at 17 wks.   EDC 07/12/20 by [place].   Anatomy scan:  19.6 wks, complete  Last evaluation: 35  wks   Significant prenatal events: Positive Covid  06/09/20  Patient Active Problem List   Diagnosis Date Noted  . Normal labor and delivery 06/21/2020  . H/O cardiac radiofrequency ablation 05/09/2017  . History of Wolff-Parkinson-White (WPW) syndrome 05/09/2017  . Chest pain syndrome 05/09/2017  . Dysmenorrhea 03/09/2012  . Irregular menses 03/09/2012  . Abnormal urine odor 03/09/2012  . Labial hypertrophy 03/09/2012    Prenatal Labs: ABO, Rh:  A positive Antibody:  negative Rubella:   immune RPR:   NR HBsAg:   NR HIV:   NR GTT: 117 GBS:   unknown (not done d/t dates) GC/CHL: negative/negative Genetics: Declined Tdap/influenza vaccines: Declined Tdap/ UTD flu   OB History  Gravida Para Term Preterm AB Living  3 0 0 0 2 0  SAB IAB Ectopic Multiple Live Births  2 0 0 0      # Outcome Date GA Lbr Len/2nd Weight Sex Delivery Anes PTL Lv  3 Current           2 SAB           1 SAB             Medical / Surgical History: Past medical history:  Past Medical History:  Diagnosis Date  . H/O varicella   . Increased BMI 11/20/2009  . Wolff-Parkinson-White syndrome   . Yeast infection     Past surgical history:  Past Surgical History:  Procedure Laterality Date  . CARDIAC ELECTROPHYSIOLOGY STUDY AND ABLATION    . TONSILLECTOMY     Family History:  Family History  Problem Relation Age of Onset  . Diabetes Maternal Grandmother   . Heart disease Maternal Grandmother   . Hypertension Father   . Diabetes Mother    . Hypertension Mother   . Diabetes Maternal Aunt   . Diabetes Maternal Aunt     Social History:  reports that she has never smoked. She has never used smokeless tobacco. She reports previous alcohol use. She reports that she does not use drugs.  Allergies: Patient has no known allergies.   Current Medications at time of admission:  Prior to Admission medications   Medication Sig Start Date End Date Taking? Authorizing Provider  ferrous sulfate 324 (65 Fe) MG TBEC Take 1 tablet (325 mg total) by mouth daily. 11/15/19  Yes [name], MD  Prenatal Vit-Fe Fumarate-FA (PRENATAL VITAMIN PO) Take 1 tablet by mouth daily.   Yes [provider]  valACYclovir (VALTREX) 1000 MG tablet Take 1,000 mg by mouth 2 (two) times daily. 06/02/20   [provider]    Review of Systems: Constitutional: Negative   HENT: Negative   Eyes: Negative   Respiratory: Negative   Cardiovascular: Negative   Gastrointestinal: Negative  Genitourinary: negative for bloody show, negative for LOF   Musculoskeletal: Negative   Skin: Negative   Neurological: Negative   Endo/Heme/Allergies: Negative   Psychiatric/Behavioral: Negative    Physical Exam: VS: Blood pressure (!) 152/101, pulse 99, temperature 98.6 F (  37 C), temperature source Oral, resp. rate 18, last menstrual period 10/16/2019, SpO2 98 %, unknown if currently breastfeeding. AAO x3, no signs of distress Cardiovascular: RRR Respiratory: Lung fields clear to ausculation GU/GI: Abdomen gravid, non-tender, non-distended, active FM, vertex, EFW 6lbs per Leopold's Extremities: negative edema, negative for pain, tenderness, and cords  Cervical exam:Dilation: 9 Exam by:: Allison Jones, CNM FHR: baseline rate 150 / variability moderate / accelerations negative / variable decelerations/ CAT TOCO: 3-4   Prenatal Transfer Tool  Maternal Diabetes: No Genetic Screening: Declined Maternal Ultrasounds/Referrals: Normal Fetal Ultrasounds or  other Referrals:  None Maternal Substance Abuse:  No Significant Maternal Medications:  None Significant Maternal Lab Results: Other: GBS not done Pt with Allison Jones- Parkinson White     Assessment: 29 y.o. G3P0020 [redacted]w[redacted]d Hx of Wolff Parkinson White Syndrome  Active stage of labor FHR category 2 GBS unknown, not tx Pain management plan: Plans all natural   Plan:  Admit to L&D Routine admission orders Epidural PRN  Dr Sallye Ober notified of admission and plan of care  Carollee Leitz MSN, CNM 06/21/2020 10:12 AM

## 2020-06-21 NOTE — MAU Note (Signed)
Pt reports to mau with c/o ctx since 10pm last night, pt also reports some bloody show this morning. Pt denies LOF. +FM

## 2020-06-21 NOTE — MAU Provider Note (Signed)
None   S: [name] is a 29 y.o. G3P0020 at [redacted]w[redacted]d  who presents to MAU today complaining of contractions since last night. She endorses bloody show. She denies LOF. She reports normal fetal movement.    O: BP (!) 156/100 (BP Location: Right Arm)   Pulse 99   Temp 98.3 F (36.8 C) (Oral)   Resp 18   LMP 10/16/2019   SpO2 98%  GENERAL: Well-developed, well-nourished female in no acute distress.  HEAD: Normocephalic, atraumatic.  CHEST: Normal effort of breathing, regular heart rate ABDOMEN: Soft, nontender, gravid  Cervical exam: 10/100/-1 with bulging bag     Fetal Monitoring in MAU: Reassuring   A: SIUP at [redacted]w[redacted]d  Vertex confirmed with [facility] Dr. Sallye Ober called by MAU RN, verbalizes she will notify her CNM [facility] L&D first call notified of possibility of being on standby for delivery  P: Admit to L&D  Clayton Bibles, CNM 06/21/2020 9:23 AM

## 2020-06-22 LAB — CBC
HCT: 34 % — ABNORMAL LOW (ref 36.0–46.0)
Hemoglobin: 11.8 g/dL — ABNORMAL LOW (ref 12.0–15.0)
MCH: 28.7 pg (ref 26.0–34.0)
MCHC: 34.7 g/dL (ref 30.0–36.0)
MCV: 82.7 fL (ref 80.0–100.0)
Platelets: 207 10*3/uL (ref 150–400)
RBC: 4.11 MIL/uL (ref 3.87–5.11)
RDW: 14.4 % (ref 11.5–15.5)
WBC: 9 10*3/uL (ref 4.0–10.5)
nRBC: 0 % (ref 0.0–0.2)

## 2020-06-22 LAB — RPR: RPR Ser Ql: NONREACTIVE

## 2020-06-22 NOTE — Progress Notes (Signed)
MOB was referred for history of 2019 miscarriage. °* Referral screened out by Clinical Social Worker because none of the following criteria appear to apply: °~ History of anxiety/depression during this pregnancy, or of post-partum depression following prior delivery. °~ Diagnosis of anxiety and/or depression within last 3 years °OR °* MOB's symptoms currently being treated with medication and/or therapy. ° °Please contact the Clinical Social Worker if needs arise, by MOB request, or if MOB scores greater than 9/yes to question 10 on Edinburgh Postpartum Depression Screen. ° °Trigg Delarocha D. Tanis Hensarling, MSW, LCSW °Clinical Social Worker °[phone] ° °

## 2020-06-22 NOTE — Progress Notes (Signed)
Subjective: Postpartum Day 1: Vaginal delivery,  laceration Patient up ad lib, reports no syncope or dizziness. Feeding: Bottle Contraceptive plan: unsure  Objective: Vital signs in last 24 hours: Temp:  [98 F (36.7 C)-98.2 F (36.8 C)] 98.1 F (36.7 C) (12/12 0530) Pulse Rate:  [72-86] 72 (12/12 0530) Resp:  [16-17] 16 (12/12 0530) BP: (122-150)/(78-92) 138/78 (12/12 0530) SpO2:  [99 %-100 %] 100 % (12/12 0530)  Physical Exam:  General: alert, cooperative and no distress Lochia: appropriate Uterine Fundus: firm Perineum: healing well DVT Evaluation: No evidence of DVT seen on physical exam.   CBC Latest Ref Rng & Units 06/22/2020 06/21/2020 05/19/2020  WBC 4.0 - 10.5 K/uL 9.0 9.6 5.9  Hemoglobin 12.0 - 15.0 g/dL 11.8(L) 12.9 13.2  Hematocrit 36.0 - 46.0 % 34.0(L) 34.6(L) 39.5  Platelets 150 - 400 K/uL 207 218 210     Assessment/Plan: Status post vaginal delivery day 1. Stable Continue current care. Plan for discharge tomorrow and Circumcision prior to discharge    Henderson Newcomer ProtheroCNM 06/22/2020, 11:12 AM

## 2020-06-22 NOTE — Progress Notes (Signed)
Notified Bernerd Pho CNM that the patient is complaining of burning with urination. Provider  states to keep the patient hydrated ,encourage patient to use water bottle and dermoplast spray.Provider states to monitor patient until the morning to see how the intervention work.Will continue to monitor . Patient has a second degree laceration

## 2020-06-23 MED ORDER — IBUPROFEN 600 MG PO TABS: 600.0000 mg | ORAL_TABLET | Freq: Four times a day (QID) | ORAL | 0 refills | Status: DC

## 2020-06-23 MED ORDER — ACETAMINOPHEN 325 MG PO TABS: 650.0000 mg | ORAL_TABLET | ORAL | Status: DC | PRN

## 2020-06-23 NOTE — Lactation Note (Signed)
This note was copied from a baby's chart. Lactation Consultation Note  Patient Name: Boy Ensley Blas [id number] Date: 06/23/2020 Reason for consult: Follow-up assessment   LC Follow Up Visit:  Per pediatrician's note, mother has decided to formula feed only.  No lactation services are needed.   Maternal Data    Feeding Feeding Type: Bottle Fed - Formula Nipple Type: Extra Slow Flow  LATCH Score                   Interventions    Lactation Tools Discussed/Used     Consult Status Consult Status: Complete    Tristian Bouska R Shyheim Tanney 06/23/2020, 7:46 AM

## 2020-06-23 NOTE — Discharge Summary (Signed)
Postpartum Discharge Summary  Date of Service updated 06/23/20    Patient Name: Allison Jones DOB: [birth year] MRN: [id number]  Date of admission: 06/21/2020 Delivery date:06/21/2020  Delivering provider: Holli Humbles B  Date of discharge: 06/23/2020  Admitting diagnosis: Normal labor and delivery [O80] Intrauterine pregnancy: [redacted]w[redacted]d    Secondary diagnosis:  Active Problems:   Normal labor and delivery  Additional problems:  Patient Active Problem List   Diagnosis Date Noted  . Normal labor and delivery 06/21/2020  . H/O cardiac radiofrequency ablation 05/09/2017  . History of Wolff-Parkinson-White (WPW) syndrome 05/09/2017  . Chest pain syndrome 05/09/2017  . Labial hypertrophy 03/09/2012       Discharge diagnosis: Preterm Pregnancy Delivered                                              Post partum procedures:none Augmentation: AROM Complications: None  Hospital course: Onset of Labor With Vaginal Delivery      29y.o. yo [id number] 29w4das admitted in Active Labor on 06/21/2020. Patient had an uncomplicated labor course as follows:  Membrane Rupture Time/Date: 8:49 AM ,06/21/2020   Delivery Method:Vaginal, Spontaneous  Episiotomy: None  Lacerations:  2nd degree  Patient had an uncomplicated postpartum course.  She is ambulating, tolerating a regular diet, passing flatus, and urinating well. Patient is discharged home in stable condition on 06/23/20.  Newborn Data: Birth date:06/21/2020  Birth time:9:28 AM  Gender:Female  Living status:Living  Apgars:8 ,9  Weight:2245 g   Magnesium Sulfate received: No BMZ received: No Rhophylac:N/A MMR:N/A T-DaP:declined Flu: Yes Transfusion:No  Physical exam  Vitals:   06/22/20 0530 06/22/20 1426 06/22/20 2030 06/23/20 0631  BP: 138/78 135/89 138/88 129/83  Pulse: 72 82 87 80  Resp: '16 18 18 18  ' Temp: 98.1 F (36.7 C) 97.9 F (36.6 C) 98.2 F (36.8 C) 97.7 F (36.5 C)  TempSrc: Oral Oral Oral Oral   SpO2: 100% 99% 100% 100%   General: alert, cooperative and no distress Lochia: appropriate Uterine Fundus: firm Incision: N/A DVT Evaluation: No evidence of DVT seen on physical exam. No cords or calf tenderness. No significant calf/ankle edema. Labs: Lab Results  Component Value Date   WBC 9.0 06/22/2020   HGB 11.8 (L) 06/22/2020   HCT 34.0 (L) 06/22/2020   MCV 82.7 06/22/2020   PLT 207 06/22/2020   CMP Latest Ref Rng & Units 05/19/2020  Glucose 65 - 99 mg/dL 118(H)  BUN 6 - 20 mg/dL 5(L)  Creatinine 0.57 - 1.00 mg/dL 0.49(L)  Sodium 134 - 144 mmol/L 139  Potassium 3.5 - 5.2 mmol/L 3.9  Chloride 96 - 106 mmol/L 106  CO2 20 - 29 mmol/L 21  Calcium 8.7 - 10.2 mg/dL 8.8  Total Protein 6.0 - 8.5 g/dL 5.8(L)  Total Bilirubin 0.0 - 1.2 mg/dL 0.2  Alkaline Phos 44 - 121 IU/L 190(H)  AST 0 - 40 IU/L 13  ALT 0 - 32 IU/L 8   Edinburgh Score: Edinburgh Postnatal Depression Scale Screening Tool 06/21/2020  I have been able to laugh and see the funny side of things. 0  I have looked forward with enjoyment to things. 0  I have blamed myself unnecessarily when things went wrong. 1  I have been anxious or worried for no good reason. 0  I have felt scared or panicky for no good reason.  0  Things have been getting on top of me. 0  I have been so unhappy that I have had difficulty sleeping. 0  I have felt sad or miserable. 0  I have been so unhappy that I have been crying. 0  The thought of harming myself has occurred to me. 0  Edinburgh Postnatal Depression Scale Total 1      After visit meds:  Allergies as of 06/23/2020   No Known Allergies     Medication List    STOP taking these medications   valACYclovir 1000 MG tablet Commonly known as: VALTREX     TAKE these medications   acetaminophen 325 MG tablet Commonly known as: Tylenol Take 2 tablets (650 mg total) by mouth every 4 (four) hours as needed (for pain scale < 4).   ferrous sulfate 324 (65 Fe) MG Tbec Take  1 tablet (325 mg total) by mouth daily.   ibuprofen 600 MG tablet Commonly known as: ADVIL Take 1 tablet (600 mg total) by mouth every 6 (six) hours.   PRENATAL VITAMIN PO Take 1 tablet by mouth daily.        Discharge home in stable condition Infant Feeding: Bottle Infant Disposition:home with mother Discharge instruction: per After Visit Summary and Postpartum booklet. Activity: Advance as tolerated. Pelvic rest for 6 weeks.  Diet: routine diet Anticipated Birth Control: Condoms Postpartum Appointment:6 weeks Additional Postpartum F/U: none Future Appointments:No future appointments. Follow up Visit:  [facility]. Go in 6 week(s).   Specialty: Obstetrics and Gynecology Contact information: [address] [place] [address] [phone]                  06/23/2020 Arrie Eastern, CNM

## 2020-06-24 LAB — SURGICAL PATHOLOGY

## 2020-07-29 DIAGNOSIS — Z3040 Encounter for surveillance of contraceptives, unspecified: Secondary | ICD-10-CM | POA: Diagnosis not present

## 2020-08-12 ENCOUNTER — Encounter: Payer: Self-pay | Admitting: Nurse Practitioner

## 2020-08-12 ENCOUNTER — Ambulatory Visit: Payer: BC Managed Care – PPO | Admitting: Nurse Practitioner

## 2020-08-12 VITALS — BP 140/80 | HR 78 | Temp 97.5°F | Ht 64.0 in | Wt 190.0 lb

## 2020-08-12 DIAGNOSIS — N3000 Acute cystitis without hematuria: Secondary | ICD-10-CM

## 2020-08-12 DIAGNOSIS — M62830 Muscle spasm of back: Secondary | ICD-10-CM | POA: Diagnosis not present

## 2020-08-12 DIAGNOSIS — M545 Low back pain, unspecified: Secondary | ICD-10-CM | POA: Diagnosis not present

## 2020-08-12 LAB — POCT URINALYSIS DIPSTICK
Bilirubin, UA: NEGATIVE
Blood, UA: NEGATIVE
Glucose, UA: NEGATIVE
Ketones, UA: NEGATIVE
Nitrite, UA: NEGATIVE
Protein, UA: POSITIVE — AB
Spec Grav, UA: 1.015 (ref 1.010–1.025)
Urobilinogen, UA: NEGATIVE E.U./dL — AB
pH, UA: 8 (ref 5.0–8.0)

## 2020-08-12 MED ORDER — NITROFURANTOIN MONOHYD MACRO 100 MG PO CAPS
100.0000 mg | ORAL_CAPSULE | Freq: Two times a day (BID) | ORAL | 0 refills | Status: DC
Start: 2020-08-12 — End: 2021-05-28

## 2020-08-12 MED ORDER — IBUPROFEN 800 MG PO TABS: 800.0000 mg | ORAL_TABLET | Freq: Three times a day (TID) | ORAL | 0 refills | Status: DC | PRN

## 2020-08-12 MED ORDER — KETOROLAC TROMETHAMINE 60 MG/2ML IM SOLN
60.0000 mg | Freq: Once | INTRAMUSCULAR | Status: AC
  Administered 2020-08-12: 60 mg via INTRAMUSCULAR

## 2020-08-12 MED ORDER — CYCLOBENZAPRINE HCL 5 MG PO TABS: 5.0000 mg | ORAL_TABLET | Freq: Three times a day (TID) | ORAL | 0 refills | Status: DC | PRN

## 2020-08-12 NOTE — Addendum Note (Signed)
Addended by: Janie Morning on: 08/12/2020 02:34 PM   Modules accepted: Orders

## 2020-08-12 NOTE — Patient Instructions (Addendum)
Push fluids, especially water Take Macrobid 100 mg twice daily for 7 days, as directed May take Ibuprofen 800 mg as need with food every six hours (start tomorrow) Do back exercises daily to strengthen muscles Toradol 60 mg injection given today Take Flexeril (muscle relaxers) 5 mg three times daily as needed    Urinary Tract Infection, Adult A urinary tract infection (UTI) is an infection of any part of the urinary tract. The urinary tract includes:  The kidneys.  The ureters.  The bladder.  The urethra. These organs make, store, and get rid of pee (urine) in the body. What are the causes? This infection is caused by germs (bacteria) in your genital area. These germs grow and cause swelling (inflammation) of your urinary tract. What increases the risk? The following factors may make you more likely to develop this condition:  Using a small, thin tube (catheter) to drain pee.  Not being able to control when you pee or poop (incontinence).  Being female. If you are female, these things can increase the risk: ? Using these methods to prevent pregnancy:  A medicine that kills sperm (spermicide).  A device that blocks sperm (diaphragm). ? Having low levels of a female hormone (estrogen). ? Being pregnant. You are more likely to develop this condition if:  You have genes that add to your risk.  You are sexually active.  You take antibiotic medicines.  You have trouble peeing because of: ? A prostate that is bigger than normal, if you are female. ? A blockage in the part of your body that drains pee from the bladder. ? A kidney stone. ? A nerve condition that affects your bladder. ? Not getting enough to drink. ? Not peeing often enough.  You have other conditions, such as: ? Diabetes. ? A weak disease-fighting system (immune system). ? Sickle cell disease. ? Gout. ? Injury of the spine. What are the signs or symptoms? Symptoms of this condition include:  Needing  to pee right away.  Peeing small amounts often.  Pain or burning when peeing.  Blood in the pee.  Pee that smells bad or not like normal.  Trouble peeing.  Pee that is cloudy.  Fluid coming from the vagina, if you are female.  Pain in the belly or lower back. Other symptoms include:  Vomiting.  Not feeling hungry.  Feeling mixed up (confused). This may be the first symptom in older adults.  Being tired and grouchy (irritable).  A fever.  Watery poop (diarrhea). How is this treated?  Taking antibiotic medicine.  Taking other medicines.  Drinking enough water. In some cases, you may need to see a specialist. Follow these instructions at home: Medicines  Take over-the-counter and prescription medicines only as told by your doctor.  If you were prescribed an antibiotic medicine, take it as told by your doctor. Do not stop taking it even if you start to feel better. General instructions  Make sure you: ? Pee until your bladder is empty. ? Do not hold pee for a long time. ? Empty your bladder after sex. ? Wipe from front to back after peeing or pooping if you are a female. Use each tissue one time when you wipe.  Drink enough fluid to keep your pee pale yellow.  Keep all follow-up visits.   Contact a doctor if:  You do not get better after 1-2 days.  Your symptoms go away and then come back. Get help right away if:  You have  very bad back pain.  You have very bad pain in your lower belly.  You have a fever.  You have chills.  You feeling like you will vomit or you vomit. Summary  A urinary tract infection (UTI) is an infection of any part of the urinary tract.  This condition is caused by germs in your genital area.  There are many risk factors for a UTI.  Treatment includes antibiotic medicines.  Drink enough fluid to keep your pee pale yellow. This information is not intended to replace advice given to you by your health care provider.  Make sure you discuss any questions you have with your health care provider. Document Revised: 02/08/2020 Document Reviewed: 02/08/2020 Elsevier Patient Education  2021 [employer] Acute Back Pain, Adult Acute back pain is sudden and usually short-lived. It is often caused by an injury to the muscles and tissues in the back. The injury may result from:  A muscle or ligament getting overstretched or torn (strained). Ligaments are tissues that connect bones to each other. Lifting something improperly can cause a back strain.  Wear and tear (degeneration) of the spinal disks. Spinal disks are circular tissue that provide cushioning between the bones of the spine (vertebrae).  Twisting motions, such as while playing sports or doing yard work.  A hit to the back.  Arthritis. You may have a physical exam, lab tests, and imaging tests to find the cause of your pain. Acute back pain usually goes away with rest and home care. Follow these instructions at home: Managing pain, stiffness, and swelling  Treatment may include medicines for pain and inflammation that are taken by mouth or applied to the skin, prescription pain medicine, or muscle relaxants. Take over-the-counter and prescription medicines only as told by your health care provider.  Your health care provider may recommend applying ice during the first 24-48 hours after your pain starts. To do this: ? Put ice in a plastic bag. ? Place a towel between your skin and the bag. ? Leave the ice on for 20 minutes, 2-3 times a day.  If directed, apply heat to the affected area as often as told by your health care provider. Use the heat source that your health care provider recommends, such as a moist heat pack or a heating pad. ? Place a towel between your skin and the heat source. ? Leave the heat on for 20-30 minutes. ? Remove the heat if your skin turns bright red. This is especially important if you are unable to feel pain, heat, or cold.  You have a greater risk of getting burned. Activity  Do not stay in bed. Staying in bed for more than 1-2 days can delay your recovery.  Sit up and stand up straight. Avoid leaning forward when you sit or hunching over when you stand. ? If you work at a desk, sit close to it so you do not need to lean over. Keep your chin tucked in. Keep your neck drawn back, and keep your elbows bent at a 90-degree angle (right angle). ? Sit high and close to the steering wheel when you drive. Add lower back (lumbar) support to your car seat, if needed.  Take short walks on even surfaces as soon as you are able. Try to increase the length of time you walk each day.  Do not sit, drive, or stand in one place for more than 30 minutes at a time. Sitting or standing for long periods of time can  put stress on your back.  Do not drive or use heavy machinery while taking prescription pain medicine.  Use proper lifting techniques. When you bend and lift, use positions that put less stress on your back: ? [place] your knees. ? Keep the load close to your body. ? Avoid twisting.  Exercise regularly as told by your health care provider. Exercising helps your back heal faster and helps prevent back injuries by keeping muscles strong and flexible.  Work with a physical therapist to make a safe exercise program, as recommended by your health care provider. Do any exercises as told by your physical therapist.   Lifestyle  Maintain a healthy weight. Extra weight puts stress on your back and makes it difficult to have good posture.  Avoid activities or situations that make you feel anxious or stressed. Stress and anxiety increase muscle tension and can make back pain worse. Learn ways to manage anxiety and stress, such as through exercise. General instructions  Sleep on a firm mattress in a comfortable position. Try lying on your side with your knees slightly bent. If you lie on your back, put a pillow under your  knees.  Follow your treatment plan as told by your health care provider. This may include: ? Cognitive or behavioral therapy. ? Acupuncture or massage therapy. ? Meditation or yoga. Contact a health care provider if:  You have pain that is not relieved with rest or medicine.  You have increasing pain going down into your legs or buttocks.  Your pain does not improve after 2 weeks.  You have pain at night.  You lose weight without trying.  You have a fever or chills. Get help right away if:  You develop new bowel or bladder control problems.  You have unusual weakness or numbness in your arms or legs.  You develop nausea or vomiting.  You develop abdominal pain.  You feel faint. Summary  Acute back pain is sudden and usually short-lived.  Use proper lifting techniques. When you bend and lift, use positions that put less stress on your back.  Take over-the-counter and prescription medicines and apply heat or ice as directed by your health care provider. This information is not intended to replace advice given to you by your health care provider. Make sure you discuss any questions you have with your health care provider. Document Revised: 03/21/2020 Document Reviewed: 03/21/2020 Elsevier Patient Education  2021 [employer] Ketorolac Injection What is this medicine? KETOROLAC (kee toe ROLE ak) is a non-steroidal anti-inflammatory drug, also known as an NSAID. It treats pain, inflammation, and swelling. This medicine may be used for other purposes; ask your health care provider or pharmacist if you have questions. COMMON BRAND NAME(S): Toradol What should I tell my health care provider before I take this medicine? They need to know if you have any of these conditions:  bleeding disorder  coronary artery bypass graft (CABG) within the past 2 weeks  heart attack  heart disease  heart failure  high blood pressure  if you often drink alcohol  kidney  disease  liver disease  lung or breathing disease (asthma)  receiving steroids like dexamethasone or prednisone  smoke tobacco cigarettes  stomach bleeding  stomach ulcers, other stomach or intestine problems  take medicine to treat or prevent blood clots  an unusual or allergic reaction to ketorolac, other medicines, foods, dyes, or preservatives  pregnant or trying to get pregnant  breast-feeding How should I use this medicine? This medicine is  injected into a vein or muscle. It is given by a health care provider in a hospital or clinic setting. A special MedGuide will be given to you before each treatment. Be sure to read this information carefully each time. Talk to your health care provider about the use of this medicine in children. Special care may be needed. Patients over 38 years of age may have a stronger reaction and need a smaller dose. Overdosage: If you think you have taken too much of this medicine contact a poison control center or emergency room at once. NOTE: This medicine is only for you. Do not share this medicine with others. What if I miss a dose? This does not apply. This medicine is not for regular use. What may interact with this medicine? Do not take this medicine with any of the following medications:  aspirin and aspirin-like medicines  cidofovir  methotrexate  NSAIDs, medicines for pain and inflammation, like ibuprofen or naproxen  pentoxifylline  probenecid This medicine may also interact with the following medications:  alcohol  alendronate  alprazolam  carbamazepine  diuretics  flavocoxid  fluoxetine  ginkgo  lithium  medicines for blood pressure like enalapril  medicines that affect platelets like pentoxifylline  medicines that treat or prevent blood clots like heparin, warfarin  muscle relaxants  pemetrexed  phenytoin  thiothixene This list may not describe all possible interactions. Give your health care  provider a list of all the medicines, herbs, non-prescription drugs, or dietary supplements you use. Also tell them if you smoke, drink alcohol, or use illegal drugs. Some items may interact with your medicine. What should I watch for while using this medicine? Your condition will be monitored carefully while you are receiving this medicine. Do not use this medicine for more than 5 days. It is only used for short-term treatment of moderate to severe pain. The risk of side effects such as kidney damage and stomach bleeding are higher if used for more than 5 days. Do not take other medicines that contain aspirin, ibuprofen, or naproxen with this medicine. Side effects such as stomach upset, nausea, or ulcers may be more likely to occur. Many non-prescription medicines contain aspirin, ibuprofen, or naproxen. Always read labels carefully. This medicine can cause serious ulcers and bleeding in the stomach. It can happen with no warning. Smoking, drinking alcohol, older age, and poor health can also increase risks. Call your health care provider right away if you have stomach pain or blood in your vomit or stool. Alcohol may interfere with the effect of this medicine. Avoid alcoholic drinks. This medicine may cause serious skin reactions. They can happen weeks to months after starting the medicine. Contact your health care provider right away if you notice fevers or flu-like symptoms with a rash. The rash may be red or purple and then turn into blisters or peeling of the skin. Or, you might notice a red rash with swelling of the face, lips or lymph nodes in your neck or under your arms. Talk to your health care provider if you are pregnant before taking this medicine. Taking this medicine between weeks 20 and 30 of pregnancy may harm your unborn baby. Your health care provider will monitor you closely if you need to take it. After 30 weeks of pregnancy, do not take this medicine. This medicine does not prevent a  heart attack or stroke. This medicine may increase the chance of a heart attack or stroke. The chance may increase the longer you use  this medicine or if you have heart disease. If you take aspirin to prevent a heart attack or stroke, talk to your health care provider about using this medicine. You may get drowsy or dizzy. Do not drive, use machinery, or do anything that needs mental alertness until you know how this medicine affects you. Do not stand up or sit up quickly, especially if you are an older patient. This reduces the risk of dizzy or fainting spells. What side effects may I notice from receiving this medicine? Side effects that you should report to your doctor or health care provider as soon as possible:  allergic reactions (skin rash, itching or hives; swelling of the face, lips, or tongue)  bleeding (bloody or black, tarry stools; red or dark brown urine; spitting up blood or brown material that looks like coffee grounds; red spots on the skin; unusual bruising or bleeding from the eyes, gums, or nose)  heart attack (trouble breathing; pain or tightness in the chest, neck, back or arms; unusually weak or tired)  heart failure (trouble breathing; fast, irregular heartbeat; sudden weight gain; swelling of the ankles, feet, hands; unusually weak or tired)  kidney injury (trouble passing urine or change in the amount of urine)  liver injury (dark yellow or brown urine; general ill feeling or flu-like symptoms; loss of appetite, right upper belly pain; unusually weak or tired, yellowing of the eyes or skin)  low red blood cell counts (trouble breathing; feeling faint; lightheaded, falls; unusually weak or tired)  rash, fever, and swollen lymph nodes  redness, blistering, peeling, or loosening of the skin, including inside the mouth  stroke (changes in vision; confusion; trouble speaking or understanding; severe headaches; sudden numbness or weakness of the face, arm or leg; trouble  walking; dizziness; loss of balance or coordination) Side effects that usually do not require medical attention (report to your doctor or health care professional if they continue or are bothersome):  constipation  decreased hearing, ringing in the ears  diarrhea  headache  nausea  pain, redness, or irritation at site where injected  passing gas  stomach pain  upset stomach This list may not describe all possible side effects. Call your doctor for medical advice about side effects. You may report side effects to FDA at 1-800-FDA-1088. Where should I keep my medicine? This medicine is given in a hospital or clinic. It will not be stored at home. NOTE: This sheet is a summary. It may not cover all possible information. If you have questions about this medicine, talk to your doctor, pharmacist, or health care provider.  2021 Elsevier/Gold Standard (2019-11-22 15:02:26) Ibuprofen Oral Tablets and Capsules What is this medicine? IBUPROFEN (eye BYOO proe fen) is a non-steroidal anti-inflammatory drug, also known as an NSAID. It treats pain, inflammation, and swelling. It also reduces fever and minor aches and pains caused by the cold, flu, or a sore throat. This medicine may be used for other purposes; ask your health care provider or pharmacist if you have questions. COMMON BRAND NAME(S): Advil, Advil Junior Strength, Advil Migraine, Genpril, Ibren, IBU, Ibupak, Midol, Midol Cramps and Body Aches, Motrin, Motrin IB, Motrin Junior Strength, Motrin Migraine Pain, Samson-8, Toxicology Saliva Collection What should I tell my health care provider before I take this medicine? They need to know if you have any of these conditions:  bleeding disorder  coronary artery bypass graft (CABG) within the past 2 weeks  dehydration  diarrhea  heart attack  heart disease  heart failure  high blood pressure  if you often drink alcohol  kidney disease  liver disease  lung or breathing  disease (asthma)  receiving steroids like dexamethasone or prednisone  smoke tobacco cigarettes  stomach bleeding  stomach ulcers, other stomach or intestine problems  stroke  take drugs that treat or prevent blood clots  vomiting  an unusual or allergic reaction to ibuprofen, other medicines, foods, dyes, or preservatives  pregnant or trying to get pregnant  breast-feeding How should I use this medicine? Take this drug by mouth. Take it as directed on the label. You can take it with or without food. If it upsets your stomach, take it with food. Talk to your health care provider about the use of this drug in children. While it may be prescribed for children as young as 12 for selected conditions, precautions do apply. Patients over 41 years of age may have a stronger reaction and need a smaller dose. If you get this drug as a prescription, a special MedGuide will be given to you by the pharmacist with each prescription and refill. Be sure to read this information carefully each time. Overdosage: If you think you have taken too much of this medicine contact a poison control center or emergency room at once. NOTE: This medicine is only for you. Do not share this medicine with others. What if I miss a dose? If you take this drug on a regular basis, take it as soon as you can. If it is almost time for your next dose, take only that dose. Do not take double or extra doses. What may interact with this medicine? Do not take this medicine with any of the following medications:  cidofovir  ketorolac  methotrexate  pemetrexed This medicine may also interact with the following medications:  alcohol  aspirin  diuretics  lithium  other drugs for inflammation like prednisone  warfarin This list may not describe all possible interactions. Give your health care provider a list of all the medicines, herbs, non-prescription drugs, or dietary supplements you use. Also tell them if  you smoke, drink alcohol, or use illegal drugs. Some items may interact with your medicine. What should I watch for while using this medicine? Visit your health care provider for regular checks on your progress. Tell your health care provider if your symptoms do not start to get better or if they get worse. A painful sore throat or sore throat with high fevers, headaches, nausea, or vomiting may be signs of a serious infection. Call your health care provider if these symptoms occur. Do not use this medicine for more than 2 days or give to children under 64 years of age unless your health care provider tells you to. Do not take other medicines that contain aspirin, ibuprofen, or naproxen with this medicine. Side effects such as stomach upset, nausea, or ulcers may be more likely to occur. Many non-prescription medicines contain aspirin, ibuprofen, or naproxen. Always read labels carefully. This medicine can cause serious ulcers and bleeding in the stomach. It can happen with no warning. Smoking, drinking alcohol, older age, and poor health can also increase risks. Call your health care provider right away if you have stomach pain or blood in your vomit or stool. This medicine does not prevent a heart attack or stroke. This medicine may increase the chance of a heart attack or stroke. The chance may increase the longer you use this medicine or if you have heart disease. If you take aspirin to prevent  a heart attack or stroke, talk to your health care provider about using this medicine. Alcohol may interfere with the effect of this medicine. Avoid alcoholic drinks. This medicine may cause serious skin reactions. They can happen weeks to months after starting the medicine. Contact your health care provider right away if you notice fevers or flu-like symptoms with a rash. The rash may be red or purple and then turn into blisters or peeling of the skin. Or, you might notice a red rash with swelling of the face,  lips or lymph nodes in your neck or under your arms. Talk to your health care provider if you are pregnant before taking this medicine. Taking this medicine between weeks 20 and 30 of pregnancy may harm your unborn baby. Your health care provider will monitor you closely if you need to take it. After 30 weeks of pregnancy, do not take this medicine. You may get drowsy or dizzy. Do not drive, use machinery, or do anything that needs mental alertness until you know how this medicine affects you. Do not stand up or sit up quickly, especially if you are an older patient. This reduces the risk of dizzy or fainting spells. Be careful brushing or flossing your teeth or using a toothpick because you may get an infection or bleed more easily. If you have any dental work done, tell your dentist you are receiving this medicine. This medicine may make it more difficult to get pregnant. Talk to your health care provider if you are concerned about your fertility. What side effects may I notice from receiving this medicine? Side effects that you should report to your doctor or health care provider as soon as possible:  allergic reactions (skin rash, itching or hives; swelling of the face, lips, or tongue)  aseptic meningitis (stiff neck; sensitivity to light; headache; drowsiness; fever; nausea, vomiting; rash)  bleeding (bloody or black, tarry stools; red or dark brown urine; spitting up blood or brown material that looks like coffee grounds; red spots on the skin; unusual bruising or bleeding from the eyes, gums, or nose)  blurred vision OR changes in vision  heart attack (trouble breathing; pain or tightness in the chest, neck, back or arms; unusually weak or tired)  heart failure (trouble breathing; fast, irregular heartbeat; sudden weight gain; swelling of the ankles, feet, hands; unusually weak or tired)  high potassium levels (chest pain; fast, irregular heartbeat; muscle weakness)  increase in blood  pressure  infection (fever, chills, cough, sore throat, pain or trouble passing urine)  kidney injury (trouble passing urine or change in the amount of urine)  liver injury (dark yellow or brown urine; general ill feeling or flu-like symptoms; loss of appetite, right upper belly pain; unusually weak or tired, yellowing of the eyes or skin)  low blood pressure (dizziness; feeling faint or lightheaded, falls; unusually weak or tired)  low red blood cell counts (trouble breathing; feeling faint; lightheaded, falls; unusually weak or tired)  rash, fever, and swollen lymph nodes  redness, blistering, peeling, or loosening of the skin, including inside the mouth  stroke (changes in vision; confusion; trouble speaking or understanding; severe headaches; sudden numbness or weakness of the face, arm or leg; trouble walking; dizziness; loss of balance or coordination) Side effects that usually do not require medical attention (report to your doctor or health care provider if they continue or are bothersome):  cough  constipation  diarrhea  dizziness  headache  upset stomach  vomiting This list may not  describe all possible side effects. Call your doctor for medical advice about side effects. You may report side effects to FDA at 1-800-FDA-1088. Where should I keep my medicine? Keep out of the reach of children and pets. Store at room temperature between 20 and 25 degrees C (68 and 77 degrees F). Get rid of any unused medicine after the expiration date. To get rid of medicines that are no longer needed or have expired:  Take the medicine to a medicine take-back program. Check with your pharmacy or law enforcement to find a location.  If you cannot return the medicine, check the label or package insert to see if the medicine should be thrown out in the garbage or flushed down the toilet. If you are not sure, ask your health care provider. If it is safe to put it in the trash, empty the  medicine out of the container. Mix the medicine with cat litter, dirt, coffee grounds, or other unwanted substance. Seal the mixture in a bag or container. Put it in the trash. NOTE: This sheet is a summary. It may not cover all possible information. If you have questions about this medicine, talk to your doctor, pharmacist, or health care provider.  2021 Elsevier/Gold Standard (2019-11-23 12:06:36) Back Exercises These exercises help to make your trunk and back strong. They also help to keep the lower back flexible. Doing these exercises can help to prevent back pain or lessen existing pain.  If you have back pain, try to do these exercises 2-3 times each day or as told by your doctor.  As you get better, do the exercises once each day. Repeat the exercises more often as told by your doctor.  To stop back pain from coming back, do the exercises once each day, or as told by your doctor. Exercises Single knee to chest Do these steps 3-5 times in a row for each leg: 1. Lie on your back on a firm bed or the floor with your legs stretched out. 2. Bring one knee to your chest. 3. Grab your knee or thigh with both hands and hold them it in place. 4. Pull on your knee until you feel a gentle stretch in your lower back or buttocks. 5. Keep doing the stretch for 10-30 seconds. 6. Slowly let go of your leg and straighten it. Pelvic tilt Do these steps 5-10 times in a row: 1. Lie on your back on a firm bed or the floor with your legs stretched out. 2. Bend your knees so they point up to the ceiling. Your feet should be flat on the floor. 3. Tighten your lower belly (abdomen) muscles to press your lower back against the floor. This will make your tailbone point up to the ceiling instead of pointing down to your feet or the floor. 4. Stay in this position for 5-10 seconds while you gently tighten your muscles and breathe evenly. Cat-cow Do these steps until your lower back bends more easily: 1. Get  on your hands and knees on a firm surface. Keep your hands under your shoulders, and keep your knees under your hips. You may put padding under your knees. 2. Let your head hang down toward your chest. Tighten (contract) the muscles in your belly. Point your tailbone toward the floor so your lower back becomes rounded like the back of a cat. 3. Stay in this position for 5 seconds. 4. Slowly lift your head. Let the muscles of your belly relax. Point your tailbone up  toward the ceiling so your back forms a sagging arch like the back of a cow. 5. Stay in this position for 5 seconds.   Press-ups Do these steps 5-10 times in a row: 1. Lie on your belly (face-down) on the floor. 2. Place your hands near your head, about shoulder-width apart. 3. While you keep your back relaxed and keep your hips on the floor, slowly straighten your arms to raise the top half of your body and lift your shoulders. Do not use your back muscles. You may change where you place your hands in order to make yourself more comfortable. 4. Stay in this position for 5 seconds. 5. Slowly return to lying flat on the floor.   Bridges Do these steps 10 times in a row: 1. Lie on your back on a firm surface. 2. Bend your knees so they point up to the ceiling. Your feet should be flat on the floor. Your arms should be flat at your sides, next to your body. 3. Tighten your butt muscles and lift your butt off the floor until your waist is almost as high as your knees. If you do not feel the muscles working in your butt and the back of your thighs, slide your feet 1-2 inches farther away from your butt. 4. Stay in this position for 3-5 seconds. 5. Slowly lower your butt to the floor, and let your butt muscles relax. If this exercise is too easy, try doing it with your arms crossed over your chest.   Belly crunches Do these steps 5-10 times in a row: 1. Lie on your back on a firm bed or the floor with your legs stretched out. 2. Bend your  knees so they point up to the ceiling. Your feet should be flat on the floor. 3. Cross your arms over your chest. 4. Tip your chin a little bit toward your chest but do not bend your neck. 5. Tighten your belly muscles and slowly raise your chest just enough to lift your shoulder blades a tiny bit off of the floor. Avoid raising your body higher than that, because it can put too much stress on your low back. 6. Slowly lower your chest and your head to the floor. Back lifts Do these steps 5-10 times in a row: 1. Lie on your belly (face-down) with your arms at your sides, and rest your forehead on the floor. 2. Tighten the muscles in your legs and your butt. 3. Slowly lift your chest off of the floor while you keep your hips on the floor. Keep the back of your head in line with the curve in your back. Look at the floor while you do this. 4. Stay in this position for 3-5 seconds. 5. Slowly lower your chest and your face to the floor. Contact a doctor if:  Your back pain gets a lot worse when you do an exercise.  Your back pain does not get better 2 hours after you exercise. If you have any of these problems, stop doing the exercises. Do not do them again unless your doctor says it is okay. Get help right away if:  You have sudden, very bad back pain. If this happens, stop doing the exercises. Do not do them again unless your doctor says it is okay. This information is not intended to replace advice given to you by your health care provider. Make sure you discuss any questions you have with your health care provider. Document Revised: 03/23/2018 Document  Reviewed: 03/23/2018 Elsevier Patient Education  2021 [employer].

## 2020-08-12 NOTE — Progress Notes (Addendum)
Acute Office Visit  Subjective:    Patient ID: Allison Jones, female    DOB: 09-29-1990, 30 y.o.   MRN: [id number]  Chief Complaint  Patient presents with  . Low back pain    Right lower back    HPI Patient is in today for right lower back pain. She describes pain as,"someone poking me with a finger'. She denies radiation of pain, numbness or tingling in pelvis or lower extremities, or loss of bladder/bowel continence. Onset of symptoms was 3-days ago. Rates pain 5/10 at initial onset; has increased to 9/10.She denies any treatments at home. Walking and physical activity aggravates pain. Rest alleviates pain. She did experience a fall approximately two weeks ago. She denies correlation of acute back pain with fall.  She denies denies previous back pain, trauma, or surgery. She is 7-weeks post-partum. She admits to not drinking adequate water in recent weeks. She is not currently breast feeding. She was positive for COVID-19 ON 06/10/20 and received MAB infusion. She denies residual effect of COVID-19 symptoms.   Past Medical History:  Diagnosis Date  . H/O varicella   . Increased BMI 11/20/2009  . Wolff-Parkinson-White syndrome   . Yeast infection     Past Surgical History:  Procedure Laterality Date  . CARDIAC ELECTROPHYSIOLOGY STUDY AND ABLATION    . TONSILLECTOMY      Family History  Problem Relation Age of Onset  . Diabetes Maternal Grandmother   . Heart disease Maternal Grandmother   . Hypertension Father   . Diabetes Mother   . Hypertension Mother   . Diabetes Maternal Aunt   . Diabetes Maternal Aunt     Social History   Socioeconomic History  . Marital status: Single    Spouse name: Not on file  . Number of children: Not on file  . Years of education: Not on file  . Highest education level: Not on file  Occupational History  . Not on file  Tobacco Use  . Smoking status: Never Smoker  . Smokeless tobacco: Never Used  Vaping Use  . Vaping Use: Never  used  Substance and Sexual Activity  . Alcohol use: Not Currently    Alcohol/week: 0.0 standard drinks  . Drug use: No  . Sexual activity: Yes    Birth control/protection: None    Comment: 1st intercourse 30 yo-Fewer than 5 partners  Other Topics Concern  . Not on file  Social History Narrative  . Not on file   Social Determinants of Health   Financial Resource Strain: Not on file  Food Insecurity: Not on file  Transportation Needs: Not on file  Physical Activity: Not on file  Stress: Not on file  Social Connections: Not on file  Intimate Partner Violence: Not on file    Outpatient Medications Prior to Visit  Medication Sig Dispense Refill  . ferrous sulfate 324 (65 Fe) MG TBEC Take 1 tablet (325 mg total) by mouth daily. 90 tablet 4  . acetaminophen (TYLENOL) 325 MG tablet Take 2 tablets (650 mg total) by mouth every 4 (four) hours as needed (for pain scale < 4).    . ibuprofen (ADVIL) 600 MG tablet Take 1 tablet (600 mg total) by mouth every 6 (six) hours. 30 tablet 0  . Prenatal Vit-Fe Fumarate-FA (PRENATAL VITAMIN PO) Take 1 tablet by mouth daily.     No facility-administered medications prior to visit.    No Known Allergies  Review of Systems  Constitutional: Negative for fatigue and fever.  HENT: Negative for congestion, ear pain, sinus pressure and sore throat.   Eyes: Negative for pain.  Respiratory: Negative for cough, chest tightness, shortness of breath and wheezing.   Cardiovascular: Negative for chest pain and palpitations.  Gastrointestinal: Negative for abdominal pain, constipation, diarrhea, nausea and vomiting.  Genitourinary: Negative for dysuria, hematuria and urgency.  Musculoskeletal: Positive for back pain (Right sided low back pain). Negative for arthralgias, joint swelling and myalgias.  Skin: Negative for rash.  Neurological: Negative for dizziness, weakness and headaches.  Psychiatric/Behavioral: Negative for dysphoric mood. The patient is not  nervous/anxious.        Objective:    Physical Exam Vitals reviewed.  Constitutional:      Appearance: Normal appearance.  HENT:     Head: Normocephalic.     Nose: Nose normal.     Mouth/Throat:     Mouth: Mucous membranes are moist.  Cardiovascular:     Rate and Rhythm: Normal rate and regular rhythm.     Pulses: Normal pulses.     Heart sounds: Normal heart sounds.  Pulmonary:     Effort: Pulmonary effort is normal.     Breath sounds: Normal breath sounds.  Abdominal:     General: Bowel sounds are normal.     Palpations: Abdomen is soft.     Tenderness: There is no right CVA tenderness, left CVA tenderness or guarding.  Musculoskeletal:        General: Tenderness (right lower back) present.     Lumbar back: Spasms and tenderness present. No swelling, edema, signs of trauma or lacerations. Decreased range of motion. Negative right straight leg raise test and negative left straight leg raise test.  Skin:    General: Skin is warm and dry.     Capillary Refill: Capillary refill takes less than 2 seconds.  Neurological:     General: No focal deficit present.     Mental Status: She is alert and oriented to person, place, and time.  Psychiatric:        Mood and Affect: Mood normal.        Behavior: Behavior normal.        Thought Content: Thought content normal.        Judgment: Judgment normal.     BP 140/80 (BP Location: Left Arm, Patient Position: Sitting)   Pulse 78   Temp (!) 97.5 F (36.4 C) (Temporal)   Ht 5\' 4"  (1.626 m)   Wt 190 lb (86.2 kg)   LMP 10/16/2019   SpO2 98%   BMI 32.61 kg/m  Wt Readings from Last 3 Encounters:  08/12/20 190 lb (86.2 kg)  06/10/20 203 lb (92.1 kg)  05/19/20 203 lb (92.1 kg)    Health Maintenance Due  Topic Date Due  . COVID-19 Vaccine (1) Never done  . TETANUS/TDAP  Never done       Lab Results  Component Value Date   TSH 1.060 05/19/2020   Lab Results  Component Value Date   WBC 9.0 06/22/2020   HGB 11.8 (L)  06/22/2020   HCT 34.0 (L) 06/22/2020   MCV 82.7 06/22/2020   PLT 207 06/22/2020   Lab Results  Component Value Date   NA 139 05/19/2020   K 3.9 05/19/2020   CO2 21 05/19/2020   GLUCOSE 118 (H) 05/19/2020   BUN 5 (L) 05/19/2020   CREATININE 0.49 (L) 05/19/2020   BILITOT 0.2 05/19/2020   ALKPHOS 190 (H) 05/19/2020   AST 13 05/19/2020   ALT  8 05/19/2020   PROT 5.8 (L) 05/19/2020   ALBUMIN 3.4 (L) 05/19/2020   CALCIUM 8.8 05/19/2020         Assessment & Plan:   1. Acute right-sided low back pain without sciatica - POCT urinalysis dipstick - ibuprofen (ADVIL) 800 MG tablet; Take 1 tablet (800 mg total) by mouth every 8 (eight) hours as needed for mild pain.  Dispense: 30 tablet; Refill: 0 - ketorolac (TORADOL) injection 60 mg  2. Acute cystitis without hematuria - Urine Culture - nitrofurantoin, macrocrystal-monohydrate, (MACROBID) 100 MG capsule; Take 1 capsule (100 mg total) by mouth 2 (two) times daily.  Dispense: 14 capsule; Refill: 0  3. Spasm of muscle of lower back - cyclobenzaprine (FLEXERIL) 5 MG tablet; Take 1 tablet (5 mg total) by mouth 3 (three) times daily as needed for muscle spasms.  Dispense: 33 tablet; Refill: 0          Push fluids, especially water Take Macrobid 100 mg twice daily for 7 days, as directed May take Ibuprofen 800 mg as need with food every six hours (start tomorrow) Do back exercises daily to strengthen muscles Toradol 60 mg injection given today Take Flexeril (muscle relaxers) 5 mg three times daily as needed     Follow-up: PRN, if symptoms fail to improve or worsen  Signed,  Flonnie Hailstone, DNP August 12, 2020 at 2:22 p.m.

## 2020-08-14 LAB — URINE CULTURE

## 2020-10-10 DIAGNOSIS — L301 Dyshidrosis [pompholyx]: Secondary | ICD-10-CM | POA: Diagnosis not present

## 2021-03-27 DIAGNOSIS — Z20828 Contact with and (suspected) exposure to other viral communicable diseases: Secondary | ICD-10-CM | POA: Diagnosis not present

## 2021-05-28 ENCOUNTER — Other Ambulatory Visit: Payer: Self-pay

## 2021-05-28 ENCOUNTER — Ambulatory Visit (INDEPENDENT_AMBULATORY_CARE_PROVIDER_SITE_OTHER): Payer: BC Managed Care – PPO | Admitting: Nurse Practitioner

## 2021-05-28 ENCOUNTER — Encounter: Payer: Self-pay | Admitting: Nurse Practitioner

## 2021-05-28 VITALS — BP 118/78 | HR 86 | Temp 98.8°F | Resp 16 | Ht 64.0 in | Wt 191.0 lb

## 2021-05-28 DIAGNOSIS — Z6832 Body mass index (BMI) 32.0-32.9, adult: Secondary | ICD-10-CM

## 2021-05-28 DIAGNOSIS — J3489 Other specified disorders of nose and nasal sinuses: Secondary | ICD-10-CM

## 2021-05-28 DIAGNOSIS — Z0289 Encounter for other administrative examinations: Secondary | ICD-10-CM | POA: Diagnosis not present

## 2021-05-28 MED ORDER — FLUTICASONE PROPIONATE 50 MCG/ACT NA SUSP: 2.0000 | Freq: Every day | NASAL | 6 refills | Status: DC

## 2021-05-28 NOTE — Progress Notes (Signed)
Subjective:  Patient ID: Allison Jones, female    DOB: 04/24/1991  Age: 30 y.o. MRN: [id number]  Chief Complaint  Patient presents with   Annual Exam    HPI Allison Jones is a 30 year old African-American female that presents for yearly annual employment physical.  Well Adult Physical: Patient here for a comprehensive physical exam.The patient reports no problems Do you take any herbs or supplements that were not prescribed by a doctor? no Are you taking calcium supplements? no Are you taking aspirin daily? no   Patient's last physical exam was 1 year ago .  Safety: reviewed ; Patient wears a seat belt, has smoke detectors, has carbon monoxide detectors, practices appropriate gun safety, and wears sunscreen with extended sun exposure. Dental Care: biannual cleanings, brushes and flosses daily. Ophthalmology/Optometry: none Hearing loss: none Vision impairments: none     Depression screen PHQ 2/9 05/19/2020  Decreased Interest 0  Down, Depressed, Hopeless 0  PHQ - 2 Score 0       Functional Status Survey:     Health Maintenance  Topic Date Due   COVID-19 Vaccine (1) Never done   Tetanus Vaccine  Never done   Pap Smear  10/20/2020   Pap Smear  10/20/2020   Flu Shot  02/09/2021   Hepatitis C Screening: USPSTF Recommendation to screen - Ages 18-79 yo.  Completed   HIV Screening  Completed   Pneumococcal Vaccination  Aged Out   HPV Vaccine  Aged Out     Social Hx   Social History   Socioeconomic History   Marital status: Single    Spouse name: Not on file   Number of children: Not on file   Years of education: Not on file   Highest education level: Not on file  Occupational History   Not on file  Tobacco Use   Smoking status: Never   Smokeless tobacco: Never  Vaping Use   Vaping Use: Never used  Substance and Sexual Activity   Alcohol use: Not Currently    Alcohol/week: 0.0 standard drinks   Drug use: No   Sexual activity: Yes    Birth control/protection:  None    Comment: 1st intercourse 30 yo-Fewer than 5 partners  Other Topics Concern   Not on file  Social History Narrative   Not on file   Social Determinants of Health   Financial Resource Strain: Not on file  Food Insecurity: Not on file  Transportation Needs: Not on file  Physical Activity: Not on file  Stress: Not on file  Social Connections: Not on file   Past Medical History:  Diagnosis Date   H/O varicella    Increased BMI 11/20/2009   Wolff-Parkinson-White syndrome    Yeast infection    Family History  Problem Relation Age of Onset   Diabetes Maternal Grandmother    Heart disease Maternal Grandmother    Hypertension Father    Diabetes Mother    Hypertension Mother    Diabetes Maternal Aunt    Diabetes Maternal Aunt     Review of Systems  Constitutional:  Negative for chills, fatigue and fever.  HENT:  Positive for rhinorrhea. Negative for congestion, ear pain and sore throat.   Respiratory:  Negative for cough and shortness of breath.   Cardiovascular:  Negative for chest pain and palpitations.  Gastrointestinal:  Negative for abdominal pain, constipation, diarrhea, nausea and vomiting.  Endocrine: Negative for polydipsia, polyphagia and polyuria.  Genitourinary:  Negative for difficulty urinating and dysuria.  Musculoskeletal:  Negative for arthralgias, back pain and myalgias.  Skin:  Negative for rash.  Neurological:  Negative for headaches.  Psychiatric/Behavioral:  Negative for dysphoric mood. The patient is not nervous/anxious.     Objective:  BP 118/78   Pulse 86   Temp 98.8 F (37.1 C)   Resp 16   Ht 5\' 4"  (1.626 m)   Wt 191 lb (86.6 kg)   SpO2 98%   BMI 32.79 kg/m   BP/Weight 05/28/2021 08/12/2020 06/23/2020  Systolic BP 118 140 129  Diastolic BP 78 80 83  Wt. (Lbs) 191 190 -  BMI 32.79 32.61 -    Physical Exam Vitals reviewed.  Constitutional:      Appearance: Normal appearance.  HENT:     Head: Normocephalic.     Right Ear:  Tympanic membrane normal.     Left Ear: Tympanic membrane normal.     Nose: Nose normal.     Mouth/Throat:     Mouth: Mucous membranes are moist.  Eyes:     Pupils: Pupils are equal, round, and reactive to light.  Cardiovascular:     Rate and Rhythm: Normal rate and regular rhythm.     Pulses: Normal pulses.     Heart sounds: Normal heart sounds.  Pulmonary:     Effort: Pulmonary effort is normal.     Breath sounds: Normal breath sounds.  Abdominal:     General: Bowel sounds are normal.     Palpations: Abdomen is soft.  Musculoskeletal:        General: Normal range of motion.     Cervical back: Neck supple.  Skin:    General: Skin is warm and dry.     Capillary Refill: Capillary refill takes less than 2 seconds.  Neurological:     General: No focal deficit present.     Mental Status: She is alert and oriented to person, place, and time.  Psychiatric:        Mood and Affect: Mood normal.        Behavior: Behavior normal.    Lab Results  Component Value Date   WBC 9.0 06/22/2020   HGB 11.8 (L) 06/22/2020   HCT 34.0 (L) 06/22/2020   PLT 207 06/22/2020   GLUCOSE 118 (H) 05/19/2020   ALT 8 05/19/2020   AST 13 05/19/2020   NA 139 05/19/2020   K 3.9 05/19/2020   CL 106 05/19/2020   CREATININE 0.49 (L) 05/19/2020   BUN 5 (L) 05/19/2020   CO2 21 05/19/2020   TSH 1.060 05/19/2020      Assessment & Plan:    1. Encounter for physical examination related to employment - CBC with Differential/Platelet - Comprehensive metabolic panel - Hemoglobin A1c - Lipid panel - TSH  2. Rhinorrhea - fluticasone (FLONASE) 50 MCG/ACT nasal spray; Place 2 sprays into both nostrils daily.  Dispense: 16 g; Refill: 6  3. Adult BMI 32.0-32.9 kg/sq m     Use Flonase nasal spray for allergies Follow-up in 8-weeks for weight management appointment  These are the goals we discussed: Weight loss    This is a list of the screening recommended for you and due dates:  Health  Maintenance  Topic Date Due   COVID-19 Vaccine (1) Never done   Tetanus Vaccine  Never done   Pap Smear  10/20/2020   Pap Smear  10/20/2020   Flu Shot  02/09/2021   Hepatitis C Screening: USPSTF Recommendation to screen - Ages 18-79 yo.  Completed  HIV Screening  Completed   Pneumococcal Vaccination  Aged Out   HPV Vaccine  Aged Out     AN INDIVIDUALIZED CARE PLAN: was established or reinforced today.   SELF MANAGEMENT: The patient and I together assessed ways to personally work towards obtaining the recommended goals  Support needs The patient and/or family needs were assessed and services were offered and not necessary at this time.    Follow-up: 8-weeks  I, Janie Morning, NP, have reviewed all documentation for this visit. The documentation on 05/28/21 for the exam, diagnosis, procedures, and orders are all accurate and complete.    Signed, Janie Morning, NP [facility] [phone]

## 2021-05-28 NOTE — Patient Instructions (Signed)
Use Flonase nasal spray for allergies Follow-up in 8-weeks for weight management appointment    Health Maintenance, Female Adopting a healthy lifestyle and getting preventive care are important in promoting health and wellness. Ask your health care provider about: The right schedule for you to have regular tests and exams. Things you can do on your own to prevent diseases and keep yourself healthy. What should I know about diet, weight, and exercise? Eat a healthy diet  Eat a diet that includes plenty of vegetables, fruits, low-fat dairy products, and lean protein. Do not eat a lot of foods that are high in solid fats, added sugars, or sodium. Maintain a healthy weight Body mass index (BMI) is used to identify weight problems. It estimates body fat based on height and weight. Your health care provider can help determine your BMI and help you achieve or maintain a healthy weight. Get regular exercise Get regular exercise. This is one of the most important things you can do for your health. Most adults should: Exercise for at least 150 minutes each week. The exercise should increase your heart rate and make you sweat (moderate-intensity exercise). Do strengthening exercises at least twice a week. This is in addition to the moderate-intensity exercise. Spend less time sitting. Even light physical activity can be beneficial. Watch cholesterol and blood lipids Have your blood tested for lipids and cholesterol at 30 years of age, then have this test every 5 years. Have your cholesterol levels checked more often if: Your lipid or cholesterol levels are high. You are older than 30 years of age. You are at high risk for heart disease. What should I know about cancer screening? Depending on your health history and family history, you may need to have cancer screening at various ages. This may include screening for: Breast cancer. Cervical cancer. Colorectal cancer. Skin cancer. Lung  cancer. What should I know about heart disease, diabetes, and high blood pressure? Blood pressure and heart disease High blood pressure causes heart disease and increases the risk of stroke. This is more likely to develop in people who have high blood pressure readings or are overweight. Have your blood pressure checked: Every 3-5 years if you are 90-61 years of age. Every year if you are 91 years old or older. Diabetes Have regular diabetes screenings. This checks your fasting blood sugar level. Have the screening done: Once every three years after age 2 if you are at a normal weight and have a low risk for diabetes. More often and at a younger age if you are overweight or have a high risk for diabetes. What should I know about preventing infection? Hepatitis B If you have a higher risk for hepatitis B, you should be screened for this virus. Talk with your health care provider to find out if you are at risk for hepatitis B infection. Hepatitis C Testing is recommended for: Everyone born from [birth year] through 1965. Anyone with known risk factors for hepatitis C. Sexually transmitted infections (STIs) Get screened for STIs, including gonorrhea and chlamydia, if: You are sexually active and are younger than 30 years of age. You are older than 30 years of age and your health care provider tells you that you are at risk for this type of infection. Your sexual activity has changed since you were last screened, and you are at increased risk for chlamydia or gonorrhea. Ask your health care provider if you are at risk. Ask your health care provider about whether you are at high  risk for HIV. Your health care provider may recommend a prescription medicine to help prevent HIV infection. If you choose to take medicine to prevent HIV, you should first get tested for HIV. You should then be tested every 3 months for as long as you are taking the medicine. Pregnancy If you are about to stop having your  period (premenopausal) and you may become pregnant, seek counseling before you get pregnant. Take 400 to 800 micrograms (mcg) of folic acid every day if you become pregnant. Ask for birth control (contraception) if you want to prevent pregnancy. Osteoporosis and menopause Osteoporosis is a disease in which the bones lose minerals and strength with aging. This can result in bone fractures. If you are 49 years old or older, or if you are at risk for osteoporosis and fractures, ask your health care provider if you should: Be screened for bone loss. Take a calcium or vitamin D supplement to lower your risk of fractures. Be given hormone replacement therapy (HRT) to treat symptoms of menopause. Follow these instructions at home: Alcohol use Do not drink alcohol if: Your health care provider tells you not to drink. You are pregnant, may be pregnant, or are planning to become pregnant. If you drink alcohol: Limit how much you have to: 0-1 drink a day. Know how much alcohol is in your drink. In the [place] one drink equals one 12 oz bottle of beer (355 mL), one 5 oz glass of wine (148 mL), or one 1 oz glass of hard liquor (44 mL). Lifestyle Do not use any products that contain nicotine or tobacco. These products include cigarettes, chewing tobacco, and vaping devices, such as e-cigarettes. If you need help quitting, ask your health care provider. Do not use street drugs. Do not share needles. Ask your health care provider for help if you need support or information about quitting drugs. General instructions Schedule regular health, dental, and eye exams. Stay current with your vaccines. Tell your health care provider if: You often feel depressed. You have ever been abused or do not feel safe at home. Summary Adopting a healthy lifestyle and getting preventive care are important in promoting health and wellness. Follow your health care provider's instructions about healthy diet, exercising, and  getting tested or screened for diseases. Follow your health care provider's instructions on monitoring your cholesterol and blood pressure. This information is not intended to replace advice given to you by your health care provider. Make sure you discuss any questions you have with your health care provider. Document Revised: 11/17/2020 Document Reviewed: 11/17/2020 Elsevier Patient Education  [place] 70-44 Years Old, Female Preventive care refers to lifestyle choices and visits with your health care provider that can promote health and wellness. Preventive care visits are also called wellness exams. What can I expect for my preventive care visit? Counseling During your preventive care visit, your health care provider may ask about your: Medical history, including: Past medical problems. Family medical history. Pregnancy history. Current health, including: Menstrual cycle. Method of birth control. Emotional well-being. Home life and relationship well-being. Sexual activity and sexual health. Lifestyle, including: Alcohol, nicotine or tobacco, and drug use. Access to firearms. Diet, exercise, and sleep habits. Work and work Statistician. Sunscreen use. Safety issues such as seatbelt and bike helmet use. Physical exam Your health care provider may check your: Height and weight. These may be used to calculate your BMI (body mass index). BMI is a measurement that tells if  you are at a healthy weight. Waist circumference. This measures the distance around your waistline. This measurement also tells if you are at a healthy weight and may help predict your risk of certain diseases, such as type 2 diabetes and high blood pressure. Heart rate and blood pressure. Body temperature. Skin for abnormal spots. What immunizations do I need? Vaccines are usually given at various ages, according to a schedule. Your health care provider will recommend vaccines for  you based on your age, medical history, and lifestyle or other factors, such as travel or where you work. What tests do I need? Screening Your health care provider may recommend screening tests for certain conditions. This may include: Pelvic exam and Pap test. Lipid and cholesterol levels. Diabetes screening. This is done by checking your blood sugar (glucose) after you have not eaten for a while (fasting). Hepatitis B test. Hepatitis C test. HIV (human immunodeficiency virus) test. STI (sexually transmitted infection) testing, if you are at risk. BRCA-related cancer screening. This may be done if you have a family history of breast, ovarian, tubal, or peritoneal cancers. Talk with your health care provider about your test results, treatment options, and if necessary, the need for more tests. Follow these instructions at home: Eating and drinking  Eat a healthy diet that includes fresh fruits and vegetables, whole grains, lean protein, and low-fat dairy products. Take vitamin and mineral supplements as recommended by your health care provider. Do not drink alcohol if: Your health care provider tells you not to drink. You are pregnant, may be pregnant, or are planning to become pregnant. If you drink alcohol: Limit how much you have to 0-1 drink a day. Know how much alcohol is in your drink. In the [place] one drink equals one 12 oz bottle of beer (355 mL), one 5 oz glass of wine (148 mL), or one 1 oz glass of hard liquor (44 mL). Lifestyle Brush your teeth every morning and night with fluoride toothpaste. Floss one time each day. Exercise for at least 30 minutes 5 or more days each week. Do not use any products that contain nicotine or tobacco. These products include cigarettes, chewing tobacco, and vaping devices, such as e-cigarettes. If you need help quitting, ask your health care provider. Do not use drugs. If you are sexually active, practice safe sex. Use a condom or other form of  protection to prevent STIs. If you do not wish to become pregnant, use a form of birth control. If you plan to become pregnant, see your health care provider for a prepregnancy visit. Find healthy ways to manage stress, such as: Meditation, yoga, or listening to music. Journaling. Talking to a trusted person. Spending time with friends and family. Minimize exposure to UV radiation to reduce your risk of skin cancer. Safety Always wear your seat belt while driving or riding in a vehicle. Do not drive: If you have been drinking alcohol. Do not ride with someone who has been drinking. If you have been using any mind-altering substances or drugs. While texting. When you are tired or distracted. Wear a helmet and other protective equipment during sports activities. If you have firearms in your house, make sure you follow all gun safety procedures. Seek help if you have been physically or sexually abused. What's next? Go to your health care provider once a year for an annual wellness visit. Ask your health care provider how often you should have your eyes and teeth checked. Stay up to date on all  vaccines. This information is not intended to replace advice given to you by your health care provider. Make sure you discuss any questions you have with your health care provider. Document Revised: 12/24/2020 Document Reviewed: 12/24/2020 Elsevier Patient Education  [place].

## 2021-05-29 LAB — LIPID PANEL
Chol/HDL Ratio: 2.6 ratio (ref 0.0–4.4)
Cholesterol, Total: 171 mg/dL (ref 100–199)
HDL: 67 mg/dL (ref >39)
LDL Chol Calc (NIH): 95 mg/dL (ref 0–99)
Triglycerides: 41 mg/dL (ref 0–149)
VLDL Cholesterol Cal: 9 mg/dL (ref 5–40)

## 2021-05-29 LAB — CBC WITH DIFFERENTIAL/PLATELET
Basophils Absolute: 0 10*3/uL (ref 0.0–0.2)
Basos: 1 %
EOS (ABSOLUTE): 0.1 10*3/uL (ref 0.0–0.4)
Eos: 1 %
Hematocrit: 32.4 % — ABNORMAL LOW (ref 34.0–46.6)
Hemoglobin: 10 g/dL — ABNORMAL LOW (ref 11.1–15.9)
Immature Grans (Abs): 0 10*3/uL (ref 0.0–0.1)
Immature Granulocytes: 0 %
Lymphocytes Absolute: 2.3 10*3/uL (ref 0.7–3.1)
Lymphs: 55 %
MCH: 19.9 pg — ABNORMAL LOW (ref 26.6–33.0)
MCHC: 30.9 g/dL — ABNORMAL LOW (ref 31.5–35.7)
MCV: 65 fL — ABNORMAL LOW (ref 79–97)
Monocytes Absolute: 0.4 10*3/uL (ref 0.1–0.9)
Monocytes: 9 %
Neutrophils Absolute: 1.4 10*3/uL (ref 1.4–7.0)
Neutrophils: 34 %
Platelets: 340 10*3/uL (ref 150–450)
RBC: 5.02 x10E6/uL (ref 3.77–5.28)
RDW: 17.9 % — ABNORMAL HIGH (ref 11.7–15.4)
WBC: 4.2 10*3/uL (ref 3.4–10.8)

## 2021-05-29 LAB — HEMOGLOBIN A1C
Est. average glucose Bld gHb Est-mCnc: 105 mg/dL
Hgb A1c MFr Bld: 5.3 % (ref 4.8–5.6)

## 2021-05-29 LAB — COMPREHENSIVE METABOLIC PANEL
ALT: 10 IU/L (ref 0–32)
AST: 22 IU/L (ref 0–40)
Albumin/Globulin Ratio: 1.5 (ref 1.2–2.2)
Albumin: 4.3 g/dL (ref 3.9–5.0)
Alkaline Phosphatase: 86 IU/L (ref 44–121)
BUN/Creatinine Ratio: 13 (ref 9–23)
BUN: 8 mg/dL (ref 6–20)
Bilirubin Total: 0.3 mg/dL (ref 0.0–1.2)
CO2: 22 mmol/L (ref 20–29)
Calcium: 8.7 mg/dL (ref 8.7–10.2)
Chloride: 104 mmol/L (ref 96–106)
Creatinine, Ser: 0.64 mg/dL (ref 0.57–1.00)
Globulin, Total: 2.8 g/dL (ref 1.5–4.5)
Glucose: 91 mg/dL (ref 70–99)
Potassium: 4.9 mmol/L (ref 3.5–5.2)
Sodium: 137 mmol/L (ref 134–144)
Total Protein: 7.1 g/dL (ref 6.0–8.5)
eGFR: 123 mL/min/{1.73_m2} (ref >59)

## 2021-05-29 LAB — CARDIOVASCULAR RISK ASSESSMENT

## 2021-05-29 LAB — TSH: TSH: 1.46 u[IU]/mL (ref 0.450–4.500)

## 2021-07-23 ENCOUNTER — Ambulatory Visit: Payer: BC Managed Care – PPO | Admitting: Nurse Practitioner

## 2021-07-23 DIAGNOSIS — Z124 Encounter for screening for malignant neoplasm of cervix: Secondary | ICD-10-CM | POA: Diagnosis not present

## 2021-07-23 DIAGNOSIS — Z01411 Encounter for gynecological examination (general) (routine) with abnormal findings: Secondary | ICD-10-CM | POA: Diagnosis not present

## 2021-07-23 DIAGNOSIS — Z113 Encounter for screening for infections with a predominantly sexual mode of transmission: Secondary | ICD-10-CM | POA: Diagnosis not present

## 2021-07-23 DIAGNOSIS — N925 Other specified irregular menstruation: Secondary | ICD-10-CM | POA: Diagnosis not present

## 2021-07-23 DIAGNOSIS — B3731 Acute candidiasis of vulva and vagina: Secondary | ICD-10-CM | POA: Diagnosis not present

## 2021-07-23 DIAGNOSIS — Z6833 Body mass index (BMI) 33.0-33.9, adult: Secondary | ICD-10-CM | POA: Diagnosis not present

## 2021-07-23 LAB — OB RESULTS CONSOLE RUBELLA ANTIBODY, IGM: Rubella: IMMUNE

## 2021-07-23 LAB — OB RESULTS CONSOLE HIV ANTIBODY (ROUTINE TESTING): HIV: NONREACTIVE

## 2021-07-23 LAB — HEPATITIS C ANTIBODY: HCV Ab: NEGATIVE

## 2021-07-23 LAB — OB RESULTS CONSOLE RPR: RPR: NONREACTIVE

## 2021-07-23 LAB — OB RESULTS CONSOLE HEPATITIS B SURFACE ANTIGEN: Hepatitis B Surface Ag: NEGATIVE

## 2021-08-04 LAB — OB RESULTS CONSOLE GC/CHLAMYDIA
Chlamydia: NEGATIVE
Neisseria Gonorrhea: NEGATIVE

## 2021-08-04 LAB — OB RESULTS CONSOLE GBS: GBS: POSITIVE

## 2021-08-20 DIAGNOSIS — A5900 Urogenital trichomoniasis, unspecified: Secondary | ICD-10-CM | POA: Diagnosis not present

## 2021-08-20 DIAGNOSIS — Z3A13 13 weeks gestation of pregnancy: Secondary | ICD-10-CM | POA: Diagnosis not present

## 2021-08-20 DIAGNOSIS — Z3482 Encounter for supervision of other normal pregnancy, second trimester: Secondary | ICD-10-CM | POA: Diagnosis not present

## 2021-08-20 DIAGNOSIS — B3731 Acute candidiasis of vulva and vagina: Secondary | ICD-10-CM | POA: Diagnosis not present

## 2021-08-20 DIAGNOSIS — I456 Pre-excitation syndrome: Secondary | ICD-10-CM | POA: Diagnosis not present

## 2021-08-20 DIAGNOSIS — Z8751 Personal history of pre-term labor: Secondary | ICD-10-CM | POA: Diagnosis not present

## 2021-08-20 DIAGNOSIS — Z8279 Family history of other congenital malformations, deformations and chromosomal abnormalities: Secondary | ICD-10-CM | POA: Diagnosis not present

## 2021-08-20 DIAGNOSIS — Z3490 Encounter for supervision of normal pregnancy, unspecified, unspecified trimester: Secondary | ICD-10-CM | POA: Diagnosis not present

## 2021-08-20 DIAGNOSIS — Z331 Pregnant state, incidental: Secondary | ICD-10-CM | POA: Diagnosis not present

## 2021-08-24 ENCOUNTER — Other Ambulatory Visit: Payer: Self-pay | Admitting: Obstetrics & Gynecology

## 2021-08-24 DIAGNOSIS — Z363 Encounter for antenatal screening for malformations: Secondary | ICD-10-CM

## 2021-09-14 ENCOUNTER — Encounter: Payer: Self-pay | Admitting: *Deleted

## 2021-09-21 ENCOUNTER — Ambulatory Visit: Payer: BC Managed Care – PPO | Attending: Obstetrics & Gynecology

## 2021-09-21 ENCOUNTER — Other Ambulatory Visit: Payer: Self-pay | Admitting: *Deleted

## 2021-09-21 ENCOUNTER — Other Ambulatory Visit: Payer: Self-pay

## 2021-09-21 ENCOUNTER — Ambulatory Visit: Payer: BC Managed Care – PPO | Admitting: *Deleted

## 2021-09-21 ENCOUNTER — Ambulatory Visit (HOSPITAL_BASED_OUTPATIENT_CLINIC_OR_DEPARTMENT_OTHER): Payer: BC Managed Care – PPO | Admitting: Obstetrics and Gynecology

## 2021-09-21 VITALS — BP 130/64 | HR 100

## 2021-09-21 DIAGNOSIS — O99212 Obesity complicating pregnancy, second trimester: Secondary | ICD-10-CM

## 2021-09-21 DIAGNOSIS — Z3A18 18 weeks gestation of pregnancy: Secondary | ICD-10-CM

## 2021-09-21 DIAGNOSIS — I456 Pre-excitation syndrome: Secondary | ICD-10-CM | POA: Diagnosis not present

## 2021-09-21 DIAGNOSIS — O09292 Supervision of pregnancy with other poor reproductive or obstetric history, second trimester: Secondary | ICD-10-CM | POA: Insufficient documentation

## 2021-09-21 DIAGNOSIS — O3509X0 Maternal care for (suspected) other central nervous system malformation or damage in fetus, not applicable or unspecified: Secondary | ICD-10-CM

## 2021-09-21 DIAGNOSIS — Z363 Encounter for antenatal screening for malformations: Secondary | ICD-10-CM | POA: Insufficient documentation

## 2021-09-21 DIAGNOSIS — O3509X1 Maternal care for (suspected) other central nervous system malformation or damage in fetus, fetus 1: Secondary | ICD-10-CM

## 2021-09-21 NOTE — Progress Notes (Addendum)
Maternal-Fetal Medicine  ? ?Name: Allison Jones ?DOB: 02-26-1991 ?MRN: [id number] ?Referring Provider: Waymon Amato, MD ? ?I had the pleasure of seeing [name] today at the [facility]. She is G4 P0111 at 18-weeks' gestation and is here for fetal anatomy scan and consultation. ? ?Obstetric history ?-05/2018: At fetal anatomy scan performed at [redacted] weeks gestation, multiple fetal anomalies were noted.  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  Patient had amniocentesis and fetal karyotype (46,XY) and microarray were normal. ?Subsequently, patient had termination of pregnancy that was performed in [place] state. ?She had early spontaneous miscarriage following that pregnancy. ?In December 2021, she had a preterm delivery at [redacted] weeks gestation of a female infant weighing 4 pounds and 15 ounces at birth.  Her daughter is in good health. ?GYN history: No history of abnormal Pap smears or cervical surgeries. ?Past medical history: Wolff-Parkinson-White syndrome and the patient had cardiac ablation in 2005 and has not had recurrence of arrhythmias. ?Past surgical history: Cardiac ablation, tonsillectomy. ?Medications: Prenatal vitamins, iron supplements. ?Allergies: No known drug allergies. ?Social history: Denies tobacco or drug or alcohol use.  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  Her partner is [place] Optometrist and he is in good health.  He is a father of her daughter (not the first pregnancy). ?Family history: No history of venous thromboembolism in the family. ?Prenatal: On cell-free fetal DNA screening, the risks of fetal aneuploidies are not increased ? ?Ultrasound ?We performed a fetal anatomical survey.  Amniotic fluid is normal and good fetal activity seen.  Fetal biometry is consistent with the previously established dates.  Important findings include: ?-Mild unilateral  ventriculomegaly (left) is seen. CSP is not visualized. Rest of the intracranial structures including posterior fossa appear normal. 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 ?-Nuchal-fold thickness appears normal. Kidneys appear normal. ?-Cardiac axis appears normal.  ?Fetal anatomical survey was not completed because of fetal position and early gestational age. ? ?Ventriculomegaly ?I counseled the patient that ventriculomegaly can be associated with fetal chromosomal malformations, genetic syndromes, fetal infection, and normal fetus. Given that she had low risk for fetal Down syndrome on cell-free fetal DNA screening, this finding is unlikely to be associated with Down syndrome. ?  ?I reassured the patient that in most cases, isolated borderline ventriculomegaly is not associated with adverse neurodevelopmental outcomes. Intracranial structures including CSP need to be reassessed in 3 weeks.  It is also likely that ventriculomegaly may resolve with advancing gestation. If progression is seen, MRI may be advised to rule out structural anomalies.  ?  ?I discussed amniocentesis to rule out chromosomal anomalies. Amniocentesis gives information on full karyotype and microarray can also be performed to detect single gene disorders.  ?  ?Since ventriculomegaly can be associated with intrauterine infections, I recommend serology for toxoplasmosis, CMV and Parvo B19 infections. ?  ?Patient would like to wait till after her next ultrasound and decide on amniocentesis. She opted not to have amniocentesis. ?I recommended fetal echocardiography. ? ?Retrognathia/Micrognathia ?Explained the finding that suggests the possibility of retrognathia and this could be familial. Facial anatomy including orbits appears normal. ? ?History of preterm delivery ?Patient delivered at [redacted] weeks gestation.  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  She was  counseled on prophylactic progesterone injections at your office.  I discussed the benefit of progesterone injections that is likely to reduce the  recurrence rate of preterm delivery.  I have reassured her that there are no long-term adverse effects in the newborns. ?Patient will consider progesterone.  ? ?History of Wolff-Parkinson-White syndrome ?Patient has successful ablation and has not had recurrence of cardiac arrhythmias.  She had successful pregnancy outcomes.  I reassured her that in the absence of symptoms, I do not expect any adverse outcomes ? ?Recommendations ?-An appointment was made for her to return in 3 weeks for completion of fetal anatomy. To reassess CSP, lateral ventricles and profile. ?-Toxoplasmosis and CMV serology (IgG and IgM) to performed at your office (discussed with Dr. Alesia Richards). ?-We have requested an appointment for fetal echocardiography ([facility]). ? ?Thank you for consultation.  If you have any questions or concerns, please contact me the [facility].  Consultation including face-to-face (more than 50%) counseling 45 minutes. ? ?

## 2021-09-22 ENCOUNTER — Other Ambulatory Visit: Payer: Self-pay

## 2021-10-05 DIAGNOSIS — N760 Acute vaginitis: Secondary | ICD-10-CM | POA: Diagnosis not present

## 2021-10-05 DIAGNOSIS — Z3A20 20 weeks gestation of pregnancy: Secondary | ICD-10-CM | POA: Diagnosis not present

## 2021-10-05 DIAGNOSIS — Z331 Pregnant state, incidental: Secondary | ICD-10-CM | POA: Diagnosis not present

## 2021-10-05 DIAGNOSIS — R8279 Other abnormal findings on microbiological examination of urine: Secondary | ICD-10-CM | POA: Diagnosis not present

## 2021-10-05 DIAGNOSIS — Z3402 Encounter for supervision of normal first pregnancy, second trimester: Secondary | ICD-10-CM | POA: Diagnosis not present

## 2021-10-05 DIAGNOSIS — O283 Abnormal ultrasonic finding on antenatal screening of mother: Secondary | ICD-10-CM | POA: Diagnosis not present

## 2021-10-13 ENCOUNTER — Other Ambulatory Visit: Payer: Self-pay

## 2021-10-14 ENCOUNTER — Ambulatory Visit: Payer: BC Managed Care – PPO | Attending: Obstetrics and Gynecology

## 2021-10-14 ENCOUNTER — Ambulatory Visit: Payer: BC Managed Care – PPO | Admitting: *Deleted

## 2021-10-14 ENCOUNTER — Other Ambulatory Visit: Payer: Self-pay | Admitting: *Deleted

## 2021-10-14 VITALS — BP 134/70 | HR 90

## 2021-10-14 DIAGNOSIS — E669 Obesity, unspecified: Secondary | ICD-10-CM | POA: Diagnosis not present

## 2021-10-14 DIAGNOSIS — Z3A21 21 weeks gestation of pregnancy: Secondary | ICD-10-CM

## 2021-10-14 DIAGNOSIS — O3509X0 Maternal care for (suspected) other central nervous system malformation or damage in fetus, not applicable or unspecified: Secondary | ICD-10-CM | POA: Diagnosis not present

## 2021-10-14 DIAGNOSIS — O99212 Obesity complicating pregnancy, second trimester: Secondary | ICD-10-CM

## 2021-10-14 DIAGNOSIS — O09292 Supervision of pregnancy with other poor reproductive or obstetric history, second trimester: Secondary | ICD-10-CM

## 2021-10-14 DIAGNOSIS — O3509X1 Maternal care for (suspected) other central nervous system malformation or damage in fetus, fetus 1: Secondary | ICD-10-CM | POA: Diagnosis not present

## 2021-10-14 DIAGNOSIS — Z363 Encounter for antenatal screening for malformations: Secondary | ICD-10-CM

## 2021-10-15 ENCOUNTER — Telehealth: Payer: Self-pay

## 2021-10-15 NOTE — Telephone Encounter (Signed)
Pt schedule for Fetal Echo at [facility] at 9:00am with Dr. Jiles Prows. ? ?Pt is aware of her appt. ?

## 2021-10-18 LAB — MATERNIT21 PLUS CORE+SCA
Fetal Fraction: 7
Monosomy X (Turner Syndrome): NOT DETECTED
Result (T21): NEGATIVE
Trisomy 13 (Patau syndrome): NEGATIVE
Trisomy 18 (Edwards syndrome): NEGATIVE
Trisomy 21 (Down syndrome): NEGATIVE
XXX (Triple X Syndrome): NOT DETECTED
XXY (Klinefelter Syndrome): NOT DETECTED
XYY (Jacobs Syndrome): NOT DETECTED

## 2021-10-29 DIAGNOSIS — O359XX0 Maternal care for (suspected) fetal abnormality and damage, unspecified, not applicable or unspecified: Secondary | ICD-10-CM | POA: Diagnosis not present

## 2021-11-02 DIAGNOSIS — N760 Acute vaginitis: Secondary | ICD-10-CM | POA: Diagnosis not present

## 2021-11-06 DIAGNOSIS — O99212 Obesity complicating pregnancy, second trimester: Secondary | ICD-10-CM | POA: Diagnosis not present

## 2021-11-06 DIAGNOSIS — Q210 Ventricular septal defect: Secondary | ICD-10-CM | POA: Diagnosis not present

## 2021-11-06 DIAGNOSIS — I361 Nonrheumatic tricuspid (valve) insufficiency: Secondary | ICD-10-CM | POA: Diagnosis not present

## 2021-11-06 DIAGNOSIS — G9389 Other specified disorders of brain: Secondary | ICD-10-CM | POA: Diagnosis not present

## 2021-11-11 ENCOUNTER — Encounter: Payer: Self-pay | Admitting: *Deleted

## 2021-11-11 ENCOUNTER — Other Ambulatory Visit: Payer: Self-pay | Admitting: *Deleted

## 2021-11-11 ENCOUNTER — Ambulatory Visit: Payer: BC Managed Care – PPO | Admitting: *Deleted

## 2021-11-11 ENCOUNTER — Ambulatory Visit: Payer: BC Managed Care – PPO | Attending: Maternal & Fetal Medicine

## 2021-11-11 VITALS — BP 147/76 | HR 99

## 2021-11-11 DIAGNOSIS — O09292 Supervision of pregnancy with other poor reproductive or obstetric history, second trimester: Secondary | ICD-10-CM | POA: Diagnosis not present

## 2021-11-11 DIAGNOSIS — O283 Abnormal ultrasonic finding on antenatal screening of mother: Secondary | ICD-10-CM

## 2021-11-11 DIAGNOSIS — Z6832 Body mass index (BMI) 32.0-32.9, adult: Secondary | ICD-10-CM | POA: Insufficient documentation

## 2021-11-11 DIAGNOSIS — O99212 Obesity complicating pregnancy, second trimester: Secondary | ICD-10-CM

## 2021-11-11 DIAGNOSIS — Z3A25 25 weeks gestation of pregnancy: Secondary | ICD-10-CM | POA: Diagnosis not present

## 2021-11-11 DIAGNOSIS — O3509X0 Maternal care for (suspected) other central nervous system malformation or damage in fetus, not applicable or unspecified: Secondary | ICD-10-CM | POA: Diagnosis not present

## 2021-11-16 DIAGNOSIS — L299 Pruritus, unspecified: Secondary | ICD-10-CM | POA: Diagnosis not present

## 2021-11-16 DIAGNOSIS — O283 Abnormal ultrasonic finding on antenatal screening of mother: Secondary | ICD-10-CM | POA: Diagnosis not present

## 2021-11-16 DIAGNOSIS — Z369 Encounter for antenatal screening, unspecified: Secondary | ICD-10-CM | POA: Diagnosis not present

## 2021-12-14 ENCOUNTER — Ambulatory Visit: Payer: BC Managed Care – PPO | Admitting: *Deleted

## 2021-12-14 ENCOUNTER — Ambulatory Visit: Payer: BC Managed Care – PPO | Attending: Obstetrics

## 2021-12-14 ENCOUNTER — Other Ambulatory Visit: Payer: Self-pay | Admitting: *Deleted

## 2021-12-14 ENCOUNTER — Encounter: Payer: Self-pay | Admitting: *Deleted

## 2021-12-14 VITALS — BP 146/74 | HR 101

## 2021-12-14 DIAGNOSIS — Z3A30 30 weeks gestation of pregnancy: Secondary | ICD-10-CM

## 2021-12-14 DIAGNOSIS — O409XX0 Polyhydramnios, unspecified trimester, not applicable or unspecified: Secondary | ICD-10-CM

## 2021-12-14 DIAGNOSIS — O09293 Supervision of pregnancy with other poor reproductive or obstetric history, third trimester: Secondary | ICD-10-CM

## 2021-12-14 DIAGNOSIS — Z6832 Body mass index (BMI) 32.0-32.9, adult: Secondary | ICD-10-CM | POA: Diagnosis not present

## 2021-12-14 DIAGNOSIS — Q210 Ventricular septal defect: Secondary | ICD-10-CM

## 2021-12-14 DIAGNOSIS — O352XX0 Maternal care for (suspected) hereditary disease in fetus, not applicable or unspecified: Secondary | ICD-10-CM | POA: Diagnosis not present

## 2021-12-14 DIAGNOSIS — O403XX0 Polyhydramnios, third trimester, not applicable or unspecified: Secondary | ICD-10-CM

## 2021-12-14 DIAGNOSIS — O283 Abnormal ultrasonic finding on antenatal screening of mother: Secondary | ICD-10-CM | POA: Insufficient documentation

## 2021-12-14 DIAGNOSIS — O99213 Obesity complicating pregnancy, third trimester: Secondary | ICD-10-CM

## 2021-12-14 DIAGNOSIS — E669 Obesity, unspecified: Secondary | ICD-10-CM

## 2021-12-14 DIAGNOSIS — O3500X3 Maternal care for (suspected) central nervous system malformation or damage in fetus, unspecified, fetus 3: Secondary | ICD-10-CM

## 2021-12-24 DIAGNOSIS — O403XX0 Polyhydramnios, third trimester, not applicable or unspecified: Secondary | ICD-10-CM | POA: Diagnosis not present

## 2021-12-24 DIAGNOSIS — O35BXX9 Maternal care for other (suspected) fetal abnormality and damage, fetal cardiac anomalies, other fetus: Secondary | ICD-10-CM | POA: Diagnosis not present

## 2021-12-24 DIAGNOSIS — Z3A31 31 weeks gestation of pregnancy: Secondary | ICD-10-CM | POA: Diagnosis not present

## 2021-12-28 DIAGNOSIS — Z3A32 32 weeks gestation of pregnancy: Secondary | ICD-10-CM | POA: Diagnosis not present

## 2021-12-28 DIAGNOSIS — O403XX0 Polyhydramnios, third trimester, not applicable or unspecified: Secondary | ICD-10-CM | POA: Diagnosis not present

## 2022-01-01 DIAGNOSIS — Z3A32 32 weeks gestation of pregnancy: Secondary | ICD-10-CM | POA: Diagnosis not present

## 2022-01-01 DIAGNOSIS — G9389 Other specified disorders of brain: Secondary | ICD-10-CM | POA: Diagnosis not present

## 2022-01-01 DIAGNOSIS — O99212 Obesity complicating pregnancy, second trimester: Secondary | ICD-10-CM | POA: Diagnosis not present

## 2022-01-01 DIAGNOSIS — Q210 Ventricular septal defect: Secondary | ICD-10-CM | POA: Diagnosis not present

## 2022-01-07 ENCOUNTER — Ambulatory Visit: Payer: BC Managed Care – PPO

## 2022-01-07 DIAGNOSIS — Z3A33 33 weeks gestation of pregnancy: Secondary | ICD-10-CM | POA: Diagnosis not present

## 2022-01-07 DIAGNOSIS — O403XX0 Polyhydramnios, third trimester, not applicable or unspecified: Secondary | ICD-10-CM | POA: Diagnosis not present

## 2022-01-11 ENCOUNTER — Ambulatory Visit (INDEPENDENT_AMBULATORY_CARE_PROVIDER_SITE_OTHER): Payer: BC Managed Care – PPO | Admitting: Nurse Practitioner

## 2022-01-11 ENCOUNTER — Encounter: Payer: Self-pay | Admitting: Nurse Practitioner

## 2022-01-11 VITALS — BP 138/90 | HR 100 | Temp 97.1°F | Ht 64.5 in | Wt 212.6 lb

## 2022-01-11 DIAGNOSIS — Z3A34 34 weeks gestation of pregnancy: Secondary | ICD-10-CM | POA: Diagnosis not present

## 2022-01-11 DIAGNOSIS — J018 Other acute sinusitis: Secondary | ICD-10-CM

## 2022-01-11 MED ORDER — AMOXICILLIN 500 MG PO CAPS: 500.0000 mg | ORAL_CAPSULE | Freq: Two times a day (BID) | ORAL | 0 refills | Status: AC

## 2022-01-11 NOTE — Progress Notes (Signed)
Acute Office Visit  Subjective:    Patient ID: Allison Jones, female    DOB: 16-May-1991, 31 y.o.   MRN: [id number]  Chief Complaint  Patient presents with   Sinusitis    HPI: Patient is in today for Upper respiratory symptoms She complains of congestion, nasal congestion, non productive cough, sinus pressure, and sore throat. Onset of symptoms was one week ago. Denies taking any treatments at home. Denies fever, chills, night sweats or rash.She is [redacted] weeks gestation. Patient is non-smoker.   Past Medical History:  Diagnosis Date   H/O varicella    Increased BMI 11/20/2009   Wolff-Parkinson-White syndrome    Yeast infection     Past Surgical History:  Procedure Laterality Date   CARDIAC ELECTROPHYSIOLOGY STUDY AND ABLATION     TONSILLECTOMY      Family History  Problem Relation Age of Onset   Diabetes Maternal Grandmother    Heart disease Maternal Grandmother    Hypertension Father    Diabetes Mother    Hypertension Mother    Diabetes Maternal Aunt    Diabetes Maternal Aunt     Social History   Socioeconomic History   Marital status: Single    Spouse name: Not on file   Number of children: Not on file   Years of education: Not on file   Highest education level: Not on file  Occupational History   Not on file  Tobacco Use   Smoking status: Never   Smokeless tobacco: Never  Vaping Use   Vaping Use: Never used  Substance and Sexual Activity   Alcohol use: Not Currently    Alcohol/week: 0.0 standard drinks of alcohol   Drug use: No   Sexual activity: Yes    Birth control/protection: None    Comment: 1st intercourse 31 yo-Fewer than 5 partners  Other Topics Concern   Not on file  Social History Narrative   Not on file   Social Determinants of Health   Financial Resource Strain: Not on file  Food Insecurity: Not on file  Transportation Needs: Not on file  Physical Activity: Not on file  Stress: Not on file  Social Connections: Not on file   Intimate Partner Violence: Not on file    Outpatient Medications Prior to Visit  Medication Sig Dispense Refill   cholecalciferol (VITAMIN D3) 25 MCG (1000 UNIT) tablet Take 1,000 Units by mouth daily.     ferrous sulfate 324 (65 Fe) MG TBEC Take 1 tablet (325 mg total) by mouth daily. 90 tablet 4   Prenatal Vit-Fe Fumarate-FA (MULTIVITAMIN-PRENATAL) 27-0.8 MG TABS tablet Take 1 tablet by mouth daily at 12 noon.     cyclobenzaprine (FLEXERIL) 5 MG tablet Take 1 tablet (5 mg total) by mouth 3 (three) times daily as needed for muscle spasms. (Patient not taking: Reported on 09/21/2021) 33 tablet 0   fluticasone (FLONASE) 50 MCG/ACT nasal spray Place 2 sprays into both nostrils daily. (Patient not taking: Reported on 09/21/2021) 16 g 6   ibuprofen (ADVIL) 800 MG tablet Take 1 tablet (800 mg total) by mouth every 8 (eight) hours as needed for mild pain. (Patient not taking: Reported on 09/21/2021) 30 tablet 0   No facility-administered medications prior to visit.    No Known Allergies  Review of Systems  Constitutional:  Positive for fatigue.  HENT:  Positive for postnasal drip, rhinorrhea, sinus pain and sore throat.   Allergic/Immunologic: Negative for environmental allergies.       Objective:  Physical Exam  Pulse 100   Temp (!) 97.1 F (36.2 C)   Ht 5' 4.5" (1.638 m)   Wt 212 lb 9.6 oz (96.4 kg)   LMP 05/17/2021   SpO2 95%   BMI 35.93 kg/m  Wt Readings from Last 3 Encounters:  01/11/22 212 lb 9.6 oz (96.4 kg)  05/28/21 191 lb (86.6 kg)  08/12/20 190 lb (86.2 kg)    Health Maintenance Due  Topic Date Due   TETANUS/TDAP  Never done   PAP SMEAR-Modifier  10/20/2020       Lab Results  Component Value Date   TSH 1.460 05/28/2021   Lab Results  Component Value Date   WBC 4.2 05/28/2021   HGB 10.0 (L) 05/28/2021   HCT 32.4 (L) 05/28/2021   MCV 65 (L) 05/28/2021   PLT 340 05/28/2021   Lab Results  Component Value Date   NA 137 05/28/2021   K 4.9 05/28/2021    CO2 22 05/28/2021   GLUCOSE 91 05/28/2021   BUN 8 05/28/2021   CREATININE 0.64 05/28/2021   BILITOT 0.3 05/28/2021   ALKPHOS 86 05/28/2021   AST 22 05/28/2021   ALT 10 05/28/2021   PROT 7.1 05/28/2021   ALBUMIN 4.3 05/28/2021   CALCIUM 8.7 05/28/2021   EGFR 123 05/28/2021   Lab Results  Component Value Date   CHOL 171 05/28/2021   Lab Results  Component Value Date   HDL 67 05/28/2021   Lab Results  Component Value Date   LDLCALC 95 05/28/2021   Lab Results  Component Value Date   TRIG 41 05/28/2021   Lab Results  Component Value Date   CHOLHDL 2.6 05/28/2021   Lab Results  Component Value Date   HGBA1C 5.3 05/28/2021       Assessment & Plan:   1. Acute non-recurrent sinusitis of other sinus - amoxicillin (AMOXIL) 500 MG capsule; Take 1 capsule (500 mg total) by mouth 2 (two) times daily for 10 days.  Dispense: 20 capsule; Refill: 0  2. [redacted] weeks gestation of pregnancy    Rest and push fluids Take Amoxicillin as directed May take Tylenol and Mucinex over-the-counter Follow-up as needed       Follow-up: PRN  An After Visit Summary was printed and given to the patient.  I, Rip Harbour, NP, have reviewed all documentation for this visit. The documentation on 01/11/22 for the exam, diagnosis, procedures, and orders are all accurate and complete.   Signed, Rip Harbour, NP [place] [phone]

## 2022-01-11 NOTE — Patient Instructions (Signed)
Common Medications Safe in Pregnancy  Acne:      Constipation:  Benzoyl Peroxide     Colace  Clindamycin      Dulcolax Suppository  Topica Erythromycin     Fibercon  Salicylic Acid      Metamucil         Miralax AVOID:        Senakot   Accutane    Cough:  Retin-A       Cough Drops  Tetracycline      Phenergan w/ Codeine if Rx  Minocycline      Robitussin (Plain & DM)  Antibiotics:     Crabs/Lice:  Ceclor       RID  Cephalosporins    AVOID:  E-Mycins      Kwell  Keflex  Macrobid/Macrodantin   Diarrhea:  Penicillin      Kao-Pectate  Zithromax      Imodium AD         PUSH FLUIDS AVOID:       Cipro     Fever:  Tetracycline      Tylenol (Regular or Extra  Minocycline       Strength)  Levaquin      Extra Strength-Do not          Exceed 8 tabs/24 hrs Caffeine:        <200mg/day (equiv. To 1 cup of coffee or  approx. 3 12 oz sodas)         Gas: Cold/Hayfever:       Gas-X  Benadryl      Mylicon  Claritin       Phazyme  **Claritin-D        Chlor-Trimeton    Headaches:  Dimetapp      ASA-Free Excedrin  Drixoral-Non-Drowsy     Cold Compress  Mucinex (Guaifenasin)     Tylenol (Regular or Extra  Sudafed/Sudafed-12 Hour     Strength)  **Sudafed PE Pseudoephedrine   Tylenol Cold & Sinus     Vicks Vapor Rub  Zyrtec  **AVOID if Problems With Blood Pressure         Heartburn: Avoid lying down for at least 1 hour after meals  Aciphex      Maalox     Rash:  Milk of Magnesia     Benadryl    Mylanta       1% Hydrocortisone Cream  Pepcid  Pepcid Complete   Sleep Aids:  Prevacid      Ambien   Prilosec       Benadryl  Rolaids       Chamomile Tea  Tums (Limit 4/day)     Unisom         Tylenol PM         Warm milk-add vanilla or  Hemorrhoids:       Sugar for taste  Anusol/Anusol H.C.  (RX: Analapram 2.5%)  Sugar Substitutes:  Hydrocortisone OTC     Ok in moderation  Preparation H      Tucks        Vaseline lotion applied to tissue with  wiping    Herpes:     Throat:  Acyclovir      Oragel  Famvir  Valtrex     Vaccines:         Flu Shot Leg Cramps:       *Gardasil  Benadryl      Hepatitis A         Hepatitis B Nasal Spray:         Pneumovax  Saline Nasal Spray     Polio Booster         Tetanus Nausea:       Tuberculosis test or PPD  Vitamin B6 25 mg TID   AVOID:    Dramamine      *Gardasil  Emetrol       Live Poliovirus  Ginger Root 250 mg QID    MMR (measles, mumps &  High Complex Carbs @ Bedtime    rebella)  Sea Bands-Accupressure    Varicella (Chickenpox)  Unisom 1/2 tab TID     *No known complications           If received before Pain:         Known pregnancy;   Darvocet       Resume series after  Lortab        Delivery  Percocet    Yeast:   Tramadol      Femstat  Tylenol 3      Gyne-lotrimin  Ultram       Monistat  Vicodin           MISC:         All Sunscreens           Hair Coloring/highlights          Insect Repellant's          (Including DEET)         Mystic Tans  Sinus Infection, Adult A sinus infection is soreness and swelling (inflammation) of your sinuses. Sinuses are hollow spaces in the bones around your face. They are located: Around your eyes. In the middle of your forehead. Behind your nose. In your cheekbones. Your sinuses and nasal passages are lined with a fluid called mucus. Mucus drains out of your sinuses. Swelling can trap mucus in your sinuses. This lets germs (bacteria, virus, or fungus) grow, which leads to infection. Most of the time, this condition is caused by a virus. What are the causes? Allergies. Asthma. Germs. Things that block your nose or sinuses. Growths in the nose (nasal polyps). Chemicals or irritants in the air. A fungus. This is rare. What increases the risk? Having a weak body defense system (immune system). Doing a lot of swimming or diving. Using nasal sprays too much. Smoking. What are the signs or symptoms? The main symptoms of this condition  are pain and a feeling of pressure around the sinuses. Other symptoms include: Stuffy nose (congestion). This may make it hard to breathe through your nose. Runny nose (drainage). Soreness, swelling, and warmth in the sinuses. A cough that may get worse at night. Being unable to smell and taste. Mucus that collects in the throat or the back of the nose (postnasal drip). This may cause a sore throat or bad breath. Being very tired (fatigued). A fever. How is this diagnosed? Your symptoms. Your medical history. A physical exam. Tests to find out if your condition is short-term (acute) or long-term (chronic). Your doctor may: Check your nose for growths (polyps). Check your sinuses using a tool that has a light on one end (endoscope). Check for allergies or germs. Do imaging tests, such as an MRI or CT scan. How is this treated? Treatment for this condition depends on the cause and whether it is short-term or long-term. If caused by a virus, your symptoms should go away on their own within 10 days. You may be given medicines to relieve symptoms. They include: Medicines that shrink swollen  tissue in the nose. A spray that treats swelling of the nostrils. Rinses that help get rid of thick mucus in your nose (nasal saline washes). Medicines that treat allergies (antihistamines). Over-the-counter pain relievers. If caused by bacteria, your doctor may wait to see if you will get better without treatment. You may be given antibiotic medicine if you have: A very bad infection. A weak body defense system. If caused by growths in the nose, surgery may be needed. Follow these instructions at home: Medicines Take, use, or apply over-the-counter and prescription medicines only as told by your doctor. These may include nasal sprays. If you were prescribed an antibiotic medicine, take it as told by your doctor. Do not stop taking it even if you start to feel better. Hydrate and humidify  Drink  enough water to keep your pee (urine) pale yellow. Use a cool mist humidifier to keep the humidity level in your home above 50%. Breathe in steam for 10-15 minutes, 3-4 times a day, or as told by your doctor. You can do this in the bathroom while a hot shower is running. Try not to spend time in cool or dry air. Rest Rest as much as you can. Sleep with your head raised (elevated). Make sure you get enough sleep each night. General instructions  Put a warm, moist washcloth on your face 3-4 times a day, or as often as told by your doctor. Use nasal saline washes as often as told by your doctor. Wash your hands often with soap and water. If you cannot use soap and water, use hand sanitizer. Do not smoke. Avoid being around people who are smoking (secondhand smoke). Keep all follow-up visits. Contact a doctor if: You have a fever. Your symptoms get worse. Your symptoms do not get better within 10 days. Get help right away if: You have a very bad headache. You cannot stop vomiting. You have very bad pain or swelling around your face or eyes. You have trouble seeing. You feel confused. Your neck is stiff. You have trouble breathing. These symptoms may be an emergency. Get help right away. Call 911. Do not wait to see if the symptoms will go away. Do not drive yourself to the hospital. Summary A sinus infection is swelling of your sinuses. Sinuses are hollow spaces in the bones around your face. This condition is caused by tissues in your nose that become inflamed or swollen. This traps germs. These can lead to infection. If you were prescribed an antibiotic medicine, take it as told by your doctor. Do not stop taking it even if you start to feel better. Keep all follow-up visits. This information is not intended to replace advice given to you by your health care provider. Make sure you discuss any questions you have with your health care provider. Document Revised: 06/02/2021 Document  Reviewed: 06/02/2021 Elsevier Patient Education  [place].

## 2022-01-15 DIAGNOSIS — Z3A34 34 weeks gestation of pregnancy: Secondary | ICD-10-CM | POA: Diagnosis not present

## 2022-01-15 DIAGNOSIS — O403XX0 Polyhydramnios, third trimester, not applicable or unspecified: Secondary | ICD-10-CM | POA: Diagnosis not present

## 2022-01-21 DIAGNOSIS — O403XX0 Polyhydramnios, third trimester, not applicable or unspecified: Secondary | ICD-10-CM | POA: Diagnosis not present

## 2022-01-21 DIAGNOSIS — Z3A35 35 weeks gestation of pregnancy: Secondary | ICD-10-CM | POA: Diagnosis not present

## 2022-01-25 DIAGNOSIS — Z362 Encounter for other antenatal screening follow-up: Secondary | ICD-10-CM | POA: Diagnosis not present

## 2022-01-25 DIAGNOSIS — Z3A36 36 weeks gestation of pregnancy: Secondary | ICD-10-CM | POA: Diagnosis not present

## 2022-01-25 DIAGNOSIS — O403XX0 Polyhydramnios, third trimester, not applicable or unspecified: Secondary | ICD-10-CM | POA: Diagnosis not present

## 2022-01-29 ENCOUNTER — Encounter: Payer: Self-pay | Admitting: *Deleted

## 2022-01-29 ENCOUNTER — Ambulatory Visit: Payer: BC Managed Care – PPO | Admitting: *Deleted

## 2022-01-29 ENCOUNTER — Other Ambulatory Visit: Payer: Self-pay | Admitting: Obstetrics and Gynecology

## 2022-01-29 ENCOUNTER — Ambulatory Visit (HOSPITAL_BASED_OUTPATIENT_CLINIC_OR_DEPARTMENT_OTHER): Payer: BC Managed Care – PPO

## 2022-01-29 VITALS — BP 137/78 | HR 95

## 2022-01-29 DIAGNOSIS — O134 Gestational [pregnancy-induced] hypertension without significant proteinuria, complicating childbirth: Secondary | ICD-10-CM | POA: Diagnosis not present

## 2022-01-29 DIAGNOSIS — Q870 Congenital malformation syndromes predominantly affecting facial appearance: Secondary | ICD-10-CM | POA: Diagnosis not present

## 2022-01-29 DIAGNOSIS — Z4682 Encounter for fitting and adjustment of non-vascular catheter: Secondary | ICD-10-CM | POA: Diagnosis not present

## 2022-01-29 DIAGNOSIS — O99213 Obesity complicating pregnancy, third trimester: Secondary | ICD-10-CM | POA: Insufficient documentation

## 2022-01-29 DIAGNOSIS — O09213 Supervision of pregnancy with history of pre-term labor, third trimester: Secondary | ICD-10-CM

## 2022-01-29 DIAGNOSIS — O09293 Supervision of pregnancy with other poor reproductive or obstetric history, third trimester: Secondary | ICD-10-CM | POA: Diagnosis not present

## 2022-01-29 DIAGNOSIS — Q2112 Patent foramen ovale: Secondary | ICD-10-CM | POA: Diagnosis not present

## 2022-01-29 DIAGNOSIS — Z3A36 36 weeks gestation of pregnancy: Secondary | ICD-10-CM | POA: Insufficient documentation

## 2022-01-29 DIAGNOSIS — O403XX0 Polyhydramnios, third trimester, not applicable or unspecified: Secondary | ICD-10-CM

## 2022-01-29 DIAGNOSIS — R011 Cardiac murmur, unspecified: Secondary | ICD-10-CM | POA: Diagnosis not present

## 2022-01-29 DIAGNOSIS — E669 Obesity, unspecified: Secondary | ICD-10-CM | POA: Insufficient documentation

## 2022-01-29 DIAGNOSIS — Q210 Ventricular septal defect: Secondary | ICD-10-CM | POA: Insufficient documentation

## 2022-01-29 DIAGNOSIS — Q351 Cleft hard palate: Secondary | ICD-10-CM | POA: Diagnosis not present

## 2022-01-29 DIAGNOSIS — O3500X0 Maternal care for (suspected) central nervous system malformation or damage in fetus, unspecified, not applicable or unspecified: Secondary | ICD-10-CM | POA: Diagnosis not present

## 2022-01-29 DIAGNOSIS — O352XX0 Maternal care for (suspected) hereditary disease in fetus, not applicable or unspecified: Secondary | ICD-10-CM | POA: Diagnosis not present

## 2022-01-29 DIAGNOSIS — O409XX0 Polyhydramnios, unspecified trimester, not applicable or unspecified: Secondary | ICD-10-CM

## 2022-01-29 DIAGNOSIS — A6000 Herpesviral infection of urogenital system, unspecified: Secondary | ICD-10-CM | POA: Diagnosis not present

## 2022-01-29 DIAGNOSIS — Q899 Congenital malformation, unspecified: Secondary | ICD-10-CM | POA: Diagnosis not present

## 2022-01-29 DIAGNOSIS — O35AXX0 Maternal care for other (suspected) fetal abnormality and damage, fetal facial anomalies, not applicable or unspecified: Secondary | ICD-10-CM | POA: Diagnosis not present

## 2022-01-29 DIAGNOSIS — Z6832 Body mass index (BMI) 32.0-32.9, adult: Secondary | ICD-10-CM

## 2022-01-29 DIAGNOSIS — M2619 Other specified anomalies of jaw-cranial base relationship: Secondary | ICD-10-CM | POA: Diagnosis not present

## 2022-01-29 DIAGNOSIS — O26893 Other specified pregnancy related conditions, third trimester: Secondary | ICD-10-CM | POA: Diagnosis not present

## 2022-01-29 DIAGNOSIS — O99824 Streptococcus B carrier state complicating childbirth: Secondary | ICD-10-CM | POA: Diagnosis not present

## 2022-01-29 DIAGNOSIS — O9832 Other infections with a predominantly sexual mode of transmission complicating childbirth: Secondary | ICD-10-CM | POA: Diagnosis not present

## 2022-01-29 DIAGNOSIS — Z0389 Encounter for observation for other suspected diseases and conditions ruled out: Secondary | ICD-10-CM | POA: Diagnosis not present

## 2022-01-29 DIAGNOSIS — Z3A37 37 weeks gestation of pregnancy: Secondary | ICD-10-CM | POA: Diagnosis not present

## 2022-02-01 ENCOUNTER — Other Ambulatory Visit: Payer: Self-pay

## 2022-02-01 ENCOUNTER — Encounter (HOSPITAL_COMMUNITY): Payer: Self-pay | Admitting: Obstetrics and Gynecology

## 2022-02-01 ENCOUNTER — Inpatient Hospital Stay (HOSPITAL_COMMUNITY)
Admission: AD | Admit: 2022-02-01 | Discharge: 2022-02-04 | DRG: 787 | Disposition: A | Discharge status: Home / Self Care | Payer: BC Managed Care – PPO | Attending: Obstetrics & Gynecology | Admitting: Obstetrics & Gynecology

## 2022-02-01 DIAGNOSIS — Z3A37 37 weeks gestation of pregnancy: Secondary | ICD-10-CM

## 2022-02-01 DIAGNOSIS — A6000 Herpesviral infection of urogenital system, unspecified: Secondary | ICD-10-CM | POA: Diagnosis present

## 2022-02-01 DIAGNOSIS — O9832 Other infections with a predominantly sexual mode of transmission complicating childbirth: Secondary | ICD-10-CM | POA: Diagnosis present

## 2022-02-01 DIAGNOSIS — O3500X0 Maternal care for (suspected) central nervous system malformation or damage in fetus, unspecified, not applicable or unspecified: Secondary | ICD-10-CM | POA: Diagnosis present

## 2022-02-01 DIAGNOSIS — O99824 Streptococcus B carrier state complicating childbirth: Secondary | ICD-10-CM | POA: Diagnosis present

## 2022-02-01 DIAGNOSIS — B009 Herpesviral infection, unspecified: Secondary | ICD-10-CM | POA: Diagnosis not present

## 2022-02-01 DIAGNOSIS — Z98891 History of uterine scar from previous surgery: Principal | ICD-10-CM

## 2022-02-01 DIAGNOSIS — O26893 Other specified pregnancy related conditions, third trimester: Secondary | ICD-10-CM | POA: Diagnosis present

## 2022-02-01 DIAGNOSIS — O35AXX0 Maternal care for other (suspected) fetal abnormality and damage, fetal facial anomalies, not applicable or unspecified: Secondary | ICD-10-CM | POA: Diagnosis present

## 2022-02-01 DIAGNOSIS — O403XX0 Polyhydramnios, third trimester, not applicable or unspecified: Secondary | ICD-10-CM | POA: Diagnosis present

## 2022-02-01 DIAGNOSIS — Z8679 Personal history of other diseases of the circulatory system: Secondary | ICD-10-CM

## 2022-02-01 DIAGNOSIS — O134 Gestational [pregnancy-induced] hypertension without significant proteinuria, complicating childbirth: Secondary | ICD-10-CM | POA: Diagnosis present

## 2022-02-01 DIAGNOSIS — B951 Streptococcus, group B, as the cause of diseases classified elsewhere: Secondary | ICD-10-CM | POA: Diagnosis present

## 2022-02-01 DIAGNOSIS — O133 Gestational [pregnancy-induced] hypertension without significant proteinuria, third trimester: Secondary | ICD-10-CM | POA: Diagnosis not present

## 2022-02-01 LAB — COMPREHENSIVE METABOLIC PANEL
ALT: 7 U/L (ref 0–44)
AST: 18 U/L (ref 15–41)
Albumin: 2.4 g/dL — ABNORMAL LOW (ref 3.5–5.0)
Alkaline Phosphatase: 181 U/L — ABNORMAL HIGH (ref 38–126)
Anion gap: 8 (ref 5–15)
BUN: <5 mg/dL — ABNORMAL LOW (ref 6–20)
CO2: 20 mmol/L — ABNORMAL LOW (ref 22–32)
Calcium: 8.5 mg/dL — ABNORMAL LOW (ref 8.9–10.3)
Chloride: 106 mmol/L (ref 98–111)
Creatinine, Ser: 0.38 mg/dL — ABNORMAL LOW (ref 0.44–1.00)
GFR, Estimated: >60 mL/min (ref >60)
Glucose, Bld: 74 mg/dL (ref 70–99)
Potassium: 3.5 mmol/L (ref 3.5–5.1)
Sodium: 134 mmol/L — ABNORMAL LOW (ref 135–145)
Total Bilirubin: 0.2 mg/dL — ABNORMAL LOW (ref 0.3–1.2)
Total Protein: 6.1 g/dL — ABNORMAL LOW (ref 6.5–8.1)

## 2022-02-01 LAB — URINALYSIS, ROUTINE W REFLEX MICROSCOPIC
Bilirubin Urine: NEGATIVE
Glucose, UA: NEGATIVE mg/dL
Hgb urine dipstick: NEGATIVE
Ketones, ur: NEGATIVE mg/dL
Nitrite: NEGATIVE
Protein, ur: NEGATIVE mg/dL
Specific Gravity, Urine: 1.009 (ref 1.005–1.030)
WBC, UA: >50 WBC/hpf — ABNORMAL HIGH (ref 0–5)
pH: 6 (ref 5.0–8.0)

## 2022-02-01 LAB — CBC
HCT: 34.2 % — ABNORMAL LOW (ref 36.0–46.0)
Hemoglobin: 10.5 g/dL — ABNORMAL LOW (ref 12.0–15.0)
MCH: 21.4 pg — ABNORMAL LOW (ref 26.0–34.0)
MCHC: 30.7 g/dL (ref 30.0–36.0)
MCV: 69.7 fL — ABNORMAL LOW (ref 80.0–100.0)
Platelets: 226 10*3/uL (ref 150–400)
RBC: 4.91 MIL/uL (ref 3.87–5.11)
RDW: 18.6 % — ABNORMAL HIGH (ref 11.5–15.5)
WBC: 8.7 10*3/uL (ref 4.0–10.5)
nRBC: 0.3 % — ABNORMAL HIGH (ref 0.0–0.2)

## 2022-02-01 LAB — PROTEIN / CREATININE RATIO, URINE
Creatinine, Urine: 83 mg/dL
Protein Creatinine Ratio: 0.28 mg/mg{Cre} — ABNORMAL HIGH (ref 0.00–0.15)
Total Protein, Urine: 23 mg/dL

## 2022-02-01 LAB — TYPE AND SCREEN
ABO/RH(D): A POS
Antibody Screen: NEGATIVE

## 2022-02-01 MED ORDER — SODIUM CHLORIDE 0.9 % IV SOLN
2.0000 g | Freq: Once | INTRAVENOUS | Status: AC
  Administered 2022-02-01: 2 g via INTRAVENOUS
  Filled 2022-02-01: qty 2000

## 2022-02-01 MED ORDER — SOD CITRATE-CITRIC ACID 500-334 MG/5ML PO SOLN
30.0000 mL | ORAL | Status: DC | PRN
Start: 2022-02-01 — End: 2022-02-02
  Administered 2022-02-02: 30 mL via ORAL

## 2022-02-01 MED ORDER — OXYCODONE-ACETAMINOPHEN 5-325 MG PO TABS: 1.0000 | ORAL_TABLET | ORAL | Status: DC | PRN

## 2022-02-01 MED ORDER — FENTANYL CITRATE (PF) 100 MCG/2ML IJ SOLN: 50.0000 ug | INTRAMUSCULAR | Status: DC | PRN

## 2022-02-01 MED ORDER — OXYTOCIN-SODIUM CHLORIDE 30-0.9 UT/500ML-% IV SOLN
INTRAVENOUS | Status: AC
  Administered 2022-02-01: 2 m[IU]/min via INTRAVENOUS
  Filled 2022-02-01: qty 500

## 2022-02-01 MED ORDER — LIDOCAINE HCL (PF) 1 % IJ SOLN: 30.0000 mL | INTRAMUSCULAR | Status: DC | PRN

## 2022-02-01 MED ORDER — LACTATED RINGERS IV SOLN: 500.0000 mL | INTRAVENOUS | Status: DC | PRN

## 2022-02-01 MED ORDER — ACETAMINOPHEN 325 MG PO TABS
650.0000 mg | ORAL_TABLET | ORAL | Status: DC | PRN
Start: 2022-02-01 — End: 2022-02-02

## 2022-02-01 MED ORDER — LACTATED RINGERS IV SOLN: INTRAVENOUS | Status: DC

## 2022-02-01 MED ORDER — TERBUTALINE SULFATE 1 MG/ML IJ SOLN
0.2500 mg | Freq: Once | INTRAMUSCULAR | Status: AC | PRN
  Administered 2022-02-02: 0.25 mg via SUBCUTANEOUS
  Filled 2022-02-01: qty 1

## 2022-02-01 MED ORDER — OXYCODONE-ACETAMINOPHEN 5-325 MG PO TABS: 2.0000 | ORAL_TABLET | ORAL | Status: DC | PRN

## 2022-02-01 MED ORDER — OXYTOCIN-SODIUM CHLORIDE 30-0.9 UT/500ML-% IV SOLN: 2.5000 [IU]/h | INTRAVENOUS | Status: DC

## 2022-02-01 MED ORDER — SODIUM CHLORIDE 0.9 % IV SOLN
1.0000 g | INTRAVENOUS | Status: DC
  Administered 2022-02-01: 1 g via INTRAVENOUS
  Filled 2022-02-01: qty 1000

## 2022-02-01 MED ORDER — ONDANSETRON HCL 4 MG/2ML IJ SOLN: 4.0000 mg | Freq: Four times a day (QID) | INTRAMUSCULAR | Status: DC | PRN

## 2022-02-01 MED ORDER — OXYTOCIN-SODIUM CHLORIDE 30-0.9 UT/500ML-% IV SOLN: 1.0000 m[IU]/min | INTRAVENOUS | Status: DC

## 2022-02-01 MED ORDER — OXYTOCIN BOLUS FROM INFUSION: 333.0000 mL | Freq: Once | INTRAVENOUS | Status: DC

## 2022-02-01 NOTE — H&P (Incomplete)
OB ADMISSION/ HISTORY & PHYSICAL:  Admission Date: 02/01/2022  4:05 PM  Admit Diagnosis: Normal labor  Allison Jones is a 31 y.o. female [phone] [redacted]w[redacted]d presenting for labor eval. Endorses active FM, denies LOF and vaginal bleeding. Ctx began @ 1500. Pregnancy complicated by fetal ventri  History of current pregnancy: [id number]   Patient entered care with [facility] at 13+4 wks.   EDC 02/21/22 by LMP and congruent w/ 8+6 wk U/S.   Anatomy scan:  *** wks, complete w/ *** placenta.   Antenatal testing: for *** started at *** weeks Last evaluation: ***  wks  Significant prenatal events: *** Patient Active Problem List   Diagnosis Date Noted  . Normal labor 02/01/2022  . H/O cardiac radiofrequency ablation 05/09/2017  . History of Wolff-Parkinson-White (WPW) syndrome 05/09/2017  . Chest pain syndrome 05/09/2017  . Labial hypertrophy 03/09/2012    Prenatal Labs: ABO, Rh: --/--/A POS (07/24 1740) Antibody: NEG (07/24 1740) Rubella:   immune RPR:   NR HBsAg:   NR HIV:   NR GTT: normal 1 hr GBS:   positive GC/CHL: neg/neg Genetics: low-risk female Vaccines: Tdap: declined Influenza: declined   OB History  Gravida Para Term Preterm AB Living  4 1 0 1 2 1   SAB IAB Ectopic Multiple Live Births  2 0 0 0 1    # Outcome Date GA Lbr Len/2nd Weight Sex Delivery Anes PTL Lv  4 Current           3 Preterm 06/21/20 [redacted]w[redacted]d 09:19 / 00:24 2245 g M Vag-Spont Local  LIV     Birth Comments: wnl  2 SAB           1 SAB             Medical / Surgical History: Past medical history:  Past Medical History:  Diagnosis Date  . H/O varicella   . Increased BMI 11/20/2009  . Wolff-Parkinson-White syndrome   . Yeast infection     Past surgical history:  Past Surgical History:  Procedure Laterality Date  . CARDIAC ELECTROPHYSIOLOGY STUDY AND ABLATION    . TONSILLECTOMY     Family History:  Family History  Problem Relation Age of Onset  . Diabetes Maternal Grandmother   . Heart disease  Maternal Grandmother   . Hypertension Father   . Diabetes Mother   . Hypertension Mother   . Diabetes Maternal Aunt   . Diabetes Maternal Aunt     Social History:  reports that she has never smoked. She has never used smokeless tobacco. She reports that she does not currently use alcohol. She reports that she does not use drugs.  Allergies: Patient has no known allergies.   Current Medications at time of admission:  Prior to Admission medications   Medication Sig Start Date End Date Taking? Authorizing Provider  cholecalciferol (VITAMIN D3) 25 MCG (1000 UNIT) tablet Take 1,000 Units by mouth daily.   Yes [provider]  ferrous sulfate 324 (65 Fe) MG TBEC Take 1 tablet (325 mg total) by mouth daily. 11/15/19  Yes 01/15/20, MD  Prenatal Vit-Fe Fumarate-FA (MULTIVITAMIN-PRENATAL) 27-0.8 MG TABS tablet Take 1 tablet by mouth daily at 12 noon.   Yes [provider]    Review of Systems: Constitutional: Negative   HENT: Negative   Eyes: Negative   Respiratory: Negative   Cardiovascular: Negative   Gastrointestinal: Negative  Genitourinary: pos for bloody show, neg for LOF   Musculoskeletal: Negative   Skin: Negative  Neurological: Negative   Endo/Heme/Allergies: Negative   Psychiatric/Behavioral: Negative    Physical Exam: VS: Blood pressure (!) 146/91, pulse 86, temperature 98.1 F (36.7 C), temperature source Oral, resp. rate 17, height 5\' 4"  (1.626 m), weight 99.3 kg, last menstrual period 05/17/2021, SpO2 100 %, unknown if currently breastfeeding. AAO x3, no signs of distress Cardiovascular: RRR Respiratory: Unlabored GU/GI: Abdomen gravid, non-tender, non-distended, active FM, vertex Extremities: no edema, negative for pain, tenderness, and cords  Cervical exam:Dilation: 6 Effacement (%): 80 Station: -3 Exam by:: [email or web address], CNM FHR: baseline rate *** / variability *** / accelerations *** / *** decelerations TOCO: ***   Prenatal Transfer  Tool  Maternal Diabetes: {Maternal Diabetes:3043596} Genetic Screening: {Genetic Screening:20205} Maternal Ultrasounds/Referrals: {Maternal Ultrasounds / Referrals:20211} Fetal Ultrasounds or other Referrals:  {Fetal Ultrasounds or Other Referrals:20213} Maternal Substance Abuse:  {Maternal Substance Abuse:20223} Significant Maternal Medications:  {Significant Maternal Meds:20233} Significant Maternal Lab Results: {Significant Maternal Lab Results:20235}    Assessment: 31 y.o. 26 [redacted]w[redacted]d  Latent stage of labor FHR category 1 GBS positive Pain management plan: per pt's request   Plan:  Admit to L&D Routine admission orders Epidural PRN AROM Pitocin augmentation Dr [redacted]w[redacted]d notified of admission by MAU.   Su Hilt DNP, CNM 02/01/2022 11:44 PM

## 2022-02-01 NOTE — H&P (Signed)
OB ADMISSION/ HISTORY & PHYSICAL:  Admission Date: 02/01/2022  4:05 PM  Admit Diagnosis: Normal labor  Allison Jones is a 31 y.o. female [phone] [redacted]w[redacted]d presenting for labor eval. Endorses active FM, denies LOF and vaginal bleeding. Ctx began @ 1500. Pregnancy complicated by left fetal ventriculomegaly with perimembranous VSD seen on fetal echo (needs fetal echo after birth), fetal retrognathia/micrognathia, and polyhydramnios highest AFI 30. 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  History of current pregnancy: [id number]   Patient entered care with [facility] at 13+4 wks.   EDC 02/21/22 by LMP and congruent w/ 8+6 wk U/S.   Anatomy scan:  18+1 wks, incomplete w/ posterior placenta.   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   Group beta Strep positive 02/02/2022   Normal labor 02/01/2022   H/O cardiac radiofrequency ablation 05/09/2017   History of Wolff-Parkinson-White (WPW) syndrome 05/09/2017   Chest pain syndrome 05/09/2017   Labial hypertrophy 03/09/2012    Prenatal Labs: ABO, Rh: --/--/A POS (07/24 1740) Antibody: NEG (07/24 1740) Rubella:   immune RPR:   NR HBsAg:   NR HIV:   NR GTT: normal 1 hr GBS:   positive GC/CHL: neg/neg Genetics: low-risk female Vaccines: Tdap: declined Influenza: declined   OB History  Gravida Para Term Preterm AB Living  4 1 0 1 2 1   SAB IAB Ectopic Multiple Live Births  2 0 0 0 1    # Outcome Date GA Lbr Len/2nd Weight Sex Delivery Anes PTL Lv  4 Current           3 Preterm 06/21/20 [redacted]w[redacted]d 09:19 / 00:24 2245 g M Vag-Spont Local  LIV     Birth Comments: wnl  2 SAB           1 SAB             Medical / Surgical  History: Past medical history:  Past Medical History:  Diagnosis Date   H/O varicella    Increased BMI 11/20/2009   Wolff-Parkinson-White syndrome    Yeast infection     Past surgical history:  Past Surgical History:  Procedure Laterality Date   CARDIAC ELECTROPHYSIOLOGY STUDY AND ABLATION     TONSILLECTOMY     Family History:  Family History  Problem Relation Age of Onset   Diabetes Maternal Grandmother    Heart disease Maternal Grandmother    Hypertension Father    Diabetes Mother    Hypertension Mother    Diabetes Maternal Aunt    Diabetes Maternal Aunt     Social History:  reports that she has never smoked. She has never used smokeless tobacco. She reports that she does not currently use alcohol. She reports that she does not use drugs.  Allergies: Patient has no known allergies.   Current Medications at time of admission:  Prior to Admission medications   Medication Sig Start Date End Date Taking? Authorizing Provider  cholecalciferol (VITAMIN D3) 25 MCG (1000 UNIT) tablet Take 1,000 Units by mouth daily.   Yes [provider]  ferrous sulfate 324 (65 Fe) MG TBEC Take 1 tablet (325 mg total) by mouth daily. 11/15/19  Yes 01/15/20, MD  Prenatal  Vit-Fe Fumarate-FA (MULTIVITAMIN-PRENATAL) 27-0.8 MG TABS tablet Take 1 tablet by mouth daily at 12 noon.   Yes [provider]    Review of Systems: Constitutional: Negative   HENT: Negative   Eyes: Negative   Respiratory: Negative   Cardiovascular: Negative   Gastrointestinal: Negative  Genitourinary: pos for bloody show, neg for LOF   Musculoskeletal: Negative   Skin: Negative   Neurological: Negative   Endo/Heme/Allergies: Negative   Psychiatric/Behavioral: Negative    Physical Exam: VS: Blood pressure (!) 146/91, pulse 86, temperature 98.1 F (36.7 C), temperature source Oral, resp. rate 17, height 5\' 4"  (1.626 m), weight 99.3 kg, last menstrual period 05/17/2021, SpO2 100 %, unknown if  currently breastfeeding. AAO x3, no signs of distress Cardiovascular: RRR Respiratory: Unlabored GU/GI: Abdomen gravid, non-tender, non-distended, active FM, vertex Extremities: no edema, negative for pain, tenderness, and cords  Cervical exam:Dilation: 6 Effacement (%): 80 Station: -3 Exam by:: [email or web address], CNM FHR: baseline rate 135 / variability moderate / accelerations presetn / absent decelerations TOCO: 3-6   Prenatal Transfer Tool  Maternal Diabetes: No Genetic Screening: Normal Maternal Ultrasounds/Referrals: Other:Fetal perimembranous VSD (Fetal micrognathia, polyhydramnios Fetal Ultrasounds or other Referrals:  Fetal echo, Referred to Materal Fetal Medicine  Maternal Substance Abuse:  No Significant Maternal Medications:  Meds include: Other:  Valtrex Significant Maternal Lab Results: Group B Strep positive    Assessment: 31 y.o. 26 [redacted]w[redacted]d Normal labor Polyhydramnios Fetal perimembranous VSD (echo after delivery and NICU present for birth) Fetal micrognathia (undersized lower jaw)  Latent stage of labor FHR category 1 GBS positive Pain management plan: per pt's request   Plan:  Admit to L&D Routine admission orders Epidural PRN Ampicillin for GBS prophylaxis AROM after second dose of Ampicillin Pitocin augmentation PRN Dr [redacted]w[redacted]d notified of admission by MAU.   Su Hilt DNP, CNM 02/01/2022 11:44 PM

## 2022-02-01 NOTE — MAU Note (Signed)
Allison Jones is a 31 y.o. at [redacted]w[redacted]d here in MAU reporting: contractions that started at 1045 today and progressively got stronger at 1430. She states they are occurring every 5 minutes. Patient denies having any LOF or vaginal bleeding. Patient is feeling fetal movement. Onset of complaint: 1045 today Pain score: 6 Vitals:   02/01/22 1647 02/01/22 1701  BP: 137/89 (!) 152/96  Pulse: 90 90  Resp:    Temp:    SpO2: 99% 98%     FHT:140

## 2022-02-02 ENCOUNTER — Inpatient Hospital Stay (HOSPITAL_COMMUNITY): Payer: BC Managed Care – PPO | Admitting: Certified Registered Nurse Anesthetist

## 2022-02-02 ENCOUNTER — Encounter (HOSPITAL_COMMUNITY)
Admission: AD | Disposition: A | Discharge status: Home / Self Care | Payer: Self-pay | Source: Home / Self Care | Attending: Obstetrics & Gynecology

## 2022-02-02 ENCOUNTER — Encounter (HOSPITAL_COMMUNITY): Payer: Self-pay | Admitting: Obstetrics and Gynecology

## 2022-02-02 ENCOUNTER — Other Ambulatory Visit: Payer: Self-pay

## 2022-02-02 DIAGNOSIS — Z98891 History of uterine scar from previous surgery: Principal | ICD-10-CM

## 2022-02-02 DIAGNOSIS — B009 Herpesviral infection, unspecified: Secondary | ICD-10-CM | POA: Diagnosis not present

## 2022-02-02 DIAGNOSIS — O133 Gestational [pregnancy-induced] hypertension without significant proteinuria, third trimester: Secondary | ICD-10-CM | POA: Diagnosis not present

## 2022-02-02 DIAGNOSIS — B951 Streptococcus, group B, as the cause of diseases classified elsewhere: Secondary | ICD-10-CM | POA: Diagnosis present

## 2022-02-02 LAB — CBC
HCT: 28.2 % — ABNORMAL LOW (ref 36.0–46.0)
Hemoglobin: 8.9 g/dL — ABNORMAL LOW (ref 12.0–15.0)
MCH: 21.5 pg — ABNORMAL LOW (ref 26.0–34.0)
MCHC: 31.6 g/dL (ref 30.0–36.0)
MCV: 68.3 fL — ABNORMAL LOW (ref 80.0–100.0)
Platelets: 201 10*3/uL (ref 150–400)
RBC: 4.13 MIL/uL (ref 3.87–5.11)
RDW: 18.1 % — ABNORMAL HIGH (ref 11.5–15.5)
WBC: 16.2 10*3/uL — ABNORMAL HIGH (ref 4.0–10.5)
nRBC: 0.2 % (ref 0.0–0.2)

## 2022-02-02 LAB — RPR: RPR Ser Ql: NONREACTIVE

## 2022-02-02 SURGERY — Surgical Case
Anesthesia: Spinal

## 2022-02-02 MED ORDER — SENNOSIDES-DOCUSATE SODIUM 8.6-50 MG PO TABS
2.0000 | ORAL_TABLET | Freq: Every day | ORAL | Status: DC
  Administered 2022-02-03 – 2022-02-04 (×2): 2 via ORAL
  Filled 2022-02-02 (×2): qty 2

## 2022-02-02 MED ORDER — SIMETHICONE 80 MG PO CHEW
80.0000 mg | CHEWABLE_TABLET | Freq: Three times a day (TID) | ORAL | Status: DC
  Administered 2022-02-02 – 2022-02-04 (×6): 80 mg via ORAL
  Filled 2022-02-02 (×7): qty 1

## 2022-02-02 MED ORDER — TRANEXAMIC ACID-NACL 1000-0.7 MG/100ML-% IV SOLN
1000.0000 mg | Freq: Once | INTRAVENOUS | Status: AC
  Administered 2022-02-02: 1000 mg via INTRAVENOUS

## 2022-02-02 MED ORDER — MEPERIDINE HCL 25 MG/ML IJ SOLN: 6.2500 mg | INTRAMUSCULAR | Status: DC | PRN

## 2022-02-02 MED ORDER — ACETAMINOPHEN 325 MG PO TABS
650.0000 mg | ORAL_TABLET | Freq: Four times a day (QID) | ORAL | Status: DC
  Administered 2022-02-02 – 2022-02-04 (×7): 650 mg via ORAL
  Filled 2022-02-02 (×7): qty 2

## 2022-02-02 MED ORDER — WITCH HAZEL-GLYCERIN EX PADS: 1.0000 | MEDICATED_PAD | CUTANEOUS | Status: DC | PRN

## 2022-02-02 MED ORDER — PRENATAL MULTIVITAMIN CH
1.0000 | ORAL_TABLET | Freq: Every day | ORAL | Status: DC
  Administered 2022-02-02 – 2022-02-04 (×3): 1 via ORAL
  Filled 2022-02-02 (×3): qty 1

## 2022-02-02 MED ORDER — PROMETHAZINE HCL 25 MG/ML IJ SOLN: 6.2500 mg | INTRAMUSCULAR | Status: DC | PRN

## 2022-02-02 MED ORDER — BUPIVACAINE IN DEXTROSE 0.75-8.25 % IT SOLN
INTRATHECAL | Status: DC | PRN
  Administered 2022-02-02: 1.5 mL via INTRATHECAL

## 2022-02-02 MED ORDER — PHENYLEPHRINE HCL-NACL 20-0.9 MG/250ML-% IV SOLN
INTRAVENOUS | Status: AC
  Filled 2022-02-02: qty 250

## 2022-02-02 MED ORDER — LACTATED RINGERS IV SOLN: INTRAVENOUS | Status: DC

## 2022-02-02 MED ORDER — CEFAZOLIN SODIUM-DEXTROSE 2-4 GM/100ML-% IV SOLN
2.0000 g | INTRAVENOUS | Status: AC
  Administered 2022-02-02: 2 g via INTRAVENOUS

## 2022-02-02 MED ORDER — SOD CITRATE-CITRIC ACID 500-334 MG/5ML PO SOLN
ORAL | Status: AC
  Filled 2022-02-02: qty 30

## 2022-02-02 MED ORDER — TETANUS-DIPHTH-ACELL PERTUSSIS 5-2.5-18.5 LF-MCG/0.5 IM SUSY: 0.5000 mL | PREFILLED_SYRINGE | Freq: Once | INTRAMUSCULAR | Status: DC

## 2022-02-02 MED ORDER — OXYTOCIN-SODIUM CHLORIDE 30-0.9 UT/500ML-% IV SOLN
INTRAVENOUS | Status: DC | PRN
  Administered 2022-02-02: 30 [IU] via INTRAVENOUS

## 2022-02-02 MED ORDER — MORPHINE SULFATE (PF) 0.5 MG/ML IJ SOLN
INTRAMUSCULAR | Status: DC | PRN
  Administered 2022-02-02: .15 mg via INTRATHECAL

## 2022-02-02 MED ORDER — TRANEXAMIC ACID-NACL 1000-0.7 MG/100ML-% IV SOLN
INTRAVENOUS | Status: AC
  Filled 2022-02-02: qty 100

## 2022-02-02 MED ORDER — COCONUT OIL OIL: 1.0000 | TOPICAL_OIL | Status: DC | PRN

## 2022-02-02 MED ORDER — FENTANYL CITRATE (PF) 100 MCG/2ML IJ SOLN
INTRAMUSCULAR | Status: AC
  Filled 2022-02-02: qty 2

## 2022-02-02 MED ORDER — DIBUCAINE (PERIANAL) 1 % EX OINT: 1.0000 | TOPICAL_OINTMENT | CUTANEOUS | Status: DC | PRN

## 2022-02-02 MED ORDER — CEFAZOLIN SODIUM-DEXTROSE 2-4 GM/100ML-% IV SOLN
INTRAVENOUS | Status: AC
  Filled 2022-02-02: qty 100

## 2022-02-02 MED ORDER — KETOROLAC TROMETHAMINE 30 MG/ML IJ SOLN
INTRAMUSCULAR | Status: AC
  Filled 2022-02-02: qty 1

## 2022-02-02 MED ORDER — LACTATED RINGERS AMNIOINFUSION: INTRAVENOUS | Status: DC

## 2022-02-02 MED ORDER — DEXAMETHASONE SODIUM PHOSPHATE 10 MG/ML IJ SOLN
INTRAMUSCULAR | Status: AC
  Filled 2022-02-02: qty 1

## 2022-02-02 MED ORDER — HYDROMORPHONE HCL 1 MG/ML IJ SOLN
0.2500 mg | INTRAMUSCULAR | Status: DC | PRN
  Administered 2022-02-02: 0.5 mg via INTRAVENOUS

## 2022-02-02 MED ORDER — DEXAMETHASONE SODIUM PHOSPHATE 10 MG/ML IJ SOLN
INTRAMUSCULAR | Status: DC | PRN
  Administered 2022-02-02: 10 mg via INTRAVENOUS

## 2022-02-02 MED ORDER — DIPHENHYDRAMINE HCL 25 MG PO CAPS: 25.0000 mg | ORAL_CAPSULE | Freq: Four times a day (QID) | ORAL | Status: DC | PRN

## 2022-02-02 MED ORDER — MORPHINE SULFATE (PF) 0.5 MG/ML IJ SOLN
INTRAMUSCULAR | Status: AC
  Filled 2022-02-02: qty 10

## 2022-02-02 MED ORDER — OXYTOCIN-SODIUM CHLORIDE 30-0.9 UT/500ML-% IV SOLN
2.5000 [IU]/h | INTRAVENOUS | Status: AC
  Administered 2022-02-02 (×2): 2.5 [IU]/h via INTRAVENOUS
  Filled 2022-02-02: qty 500

## 2022-02-02 MED ORDER — SODIUM CHLORIDE 0.9 % IR SOLN
Status: DC | PRN
  Administered 2022-02-02: 1

## 2022-02-02 MED ORDER — OXYTOCIN-SODIUM CHLORIDE 30-0.9 UT/500ML-% IV SOLN
INTRAVENOUS | Status: AC
  Filled 2022-02-02: qty 500

## 2022-02-02 MED ORDER — HYDROMORPHONE HCL 1 MG/ML IJ SOLN
INTRAMUSCULAR | Status: AC
  Filled 2022-02-02: qty 0.5

## 2022-02-02 MED ORDER — ONDANSETRON HCL 4 MG/2ML IJ SOLN
INTRAMUSCULAR | Status: AC
  Filled 2022-02-02: qty 2

## 2022-02-02 MED ORDER — MENTHOL 3 MG MT LOZG: 1.0000 | LOZENGE | OROMUCOSAL | Status: DC | PRN

## 2022-02-02 MED ORDER — ONDANSETRON HCL 4 MG/2ML IJ SOLN
INTRAMUSCULAR | Status: DC | PRN
  Administered 2022-02-02: 4 mg via INTRAVENOUS

## 2022-02-02 MED ORDER — SIMETHICONE 80 MG PO CHEW
80.0000 mg | CHEWABLE_TABLET | ORAL | Status: DC | PRN
  Administered 2022-02-02: 80 mg via ORAL

## 2022-02-02 MED ORDER — OXYCODONE HCL 5 MG PO TABS: 5.0000 mg | ORAL_TABLET | Freq: Once | ORAL | Status: DC | PRN

## 2022-02-02 MED ORDER — IBUPROFEN 600 MG PO TABS
600.0000 mg | ORAL_TABLET | Freq: Four times a day (QID) | ORAL | Status: DC
  Administered 2022-02-02 – 2022-02-04 (×9): 600 mg via ORAL
  Filled 2022-02-02 (×9): qty 1

## 2022-02-02 MED ORDER — SODIUM CHLORIDE 0.9 % IV SOLN: INTRAVENOUS | Status: DC | PRN

## 2022-02-02 MED ORDER — KETOROLAC TROMETHAMINE 30 MG/ML IJ SOLN
30.0000 mg | Freq: Once | INTRAMUSCULAR | Status: AC | PRN
Start: 2022-02-02 — End: 2022-02-02
  Administered 2022-02-02: 30 mg via INTRAVENOUS

## 2022-02-02 MED ORDER — PHENYLEPHRINE HCL-NACL 20-0.9 MG/250ML-% IV SOLN
INTRAVENOUS | Status: DC | PRN
  Administered 2022-02-02: 60 ug/min via INTRAVENOUS

## 2022-02-02 MED ORDER — FENTANYL CITRATE (PF) 100 MCG/2ML IJ SOLN
INTRAMUSCULAR | Status: DC | PRN
  Administered 2022-02-02: 15 ug via INTRATHECAL

## 2022-02-02 MED ORDER — OXYCODONE-ACETAMINOPHEN 5-325 MG PO TABS
1.0000 | ORAL_TABLET | ORAL | Status: DC | PRN
  Administered 2022-02-03 (×2): 1 via ORAL
  Administered 2022-02-04: 2 via ORAL
  Filled 2022-02-02 (×2): qty 1
  Filled 2022-02-02: qty 2

## 2022-02-02 MED ORDER — OXYCODONE HCL 5 MG/5ML PO SOLN: 5.0000 mg | Freq: Once | ORAL | Status: DC | PRN

## 2022-02-02 MED ORDER — AZITHROMYCIN 500 MG IV SOLR
500.0000 mg | INTRAVENOUS | Status: AC
  Administered 2022-02-02: 500 mg via INTRAVENOUS
  Filled 2022-02-02: qty 5

## 2022-02-02 MED ORDER — FERROUS SULFATE 325 (65 FE) MG PO TABS
325.0000 mg | ORAL_TABLET | Freq: Two times a day (BID) | ORAL | Status: DC
Start: 2022-02-02 — End: 2022-02-04
  Administered 2022-02-02 – 2022-02-04 (×5): 325 mg via ORAL
  Filled 2022-02-02 (×5): qty 1

## 2022-02-02 SURGICAL SUPPLY — 40 items
APL PRP STRL LF DISP 70% ISPRP (MISCELLANEOUS) ×2
APL SKNCLS STERI-STRIP NONHPOA (GAUZE/BANDAGES/DRESSINGS) ×2
BENZOIN TINCTURE PRP APPL 2/3 (GAUZE/BANDAGES/DRESSINGS) ×3 IMPLANT
CHLORAPREP W/TINT 26 (MISCELLANEOUS) ×4 IMPLANT
CLAMP CORD UMBIL (MISCELLANEOUS) ×2 IMPLANT
CLOTH BEACON ORANGE TIMEOUT ST (SAFETY) ×2 IMPLANT
DRAPE C SECTION CLR SCREEN (DRAPES) ×2 IMPLANT
DRSG OPSITE POSTOP 4X10 (GAUZE/BANDAGES/DRESSINGS) ×2 IMPLANT
ELECT REM PT RETURN 9FT ADLT (ELECTROSURGICAL) ×2
ELECTRODE REM PT RTRN 9FT ADLT (ELECTROSURGICAL) ×1 IMPLANT
EXTRACTOR VACUUM KIWI (MISCELLANEOUS) IMPLANT
GAUZE SPONGE 4X4 12PLY STRL LF (GAUZE/BANDAGES/DRESSINGS) ×1 IMPLANT
GLOVE BIO SURGEON STRL SZ 6.5 (GLOVE) ×2 IMPLANT
GLOVE BIOGEL PI IND STRL 7.0 (GLOVE) ×2 IMPLANT
GLOVE BIOGEL PI INDICATOR 7.0 (GLOVE) ×2
GLOVE SURG SS PI 6.5 STRL IVOR (GLOVE) ×2 IMPLANT
GOWN STRL REUS W/ TWL LRG LVL3 (GOWN DISPOSABLE) ×3 IMPLANT
GOWN STRL REUS W/TWL LRG LVL3 (GOWN DISPOSABLE) ×6
KIT ABG SYR 3ML LUER SLIP (SYRINGE) IMPLANT
NDL HYPO 25X5/8 SAFETYGLIDE (NEEDLE) IMPLANT
NEEDLE HYPO 25X5/8 SAFETYGLIDE (NEEDLE) IMPLANT
NS IRRIG 1000ML POUR BTL (IV SOLUTION) ×2 IMPLANT
PACK C SECTION WH (CUSTOM PROCEDURE TRAY) ×2 IMPLANT
PAD ABD 7.5X8 STRL (GAUZE/BANDAGES/DRESSINGS) ×1 IMPLANT
PAD OB MATERNITY 4.3X12.25 (PERSONAL CARE ITEMS) ×2 IMPLANT
RTRCTR C-SECT PINK 25CM LRG (MISCELLANEOUS) ×2 IMPLANT
STRIP CLOSURE SKIN 1/2X4 (GAUZE/BANDAGES/DRESSINGS) ×2 IMPLANT
SUT MNCRL 0 VIOLET CTX 36 (SUTURE) ×2 IMPLANT
SUT MNCRL+ AB 3-0 CT1 36 (SUTURE) ×2 IMPLANT
SUT MONOCRYL 0 CTX 36 (SUTURE) ×6
SUT MONOCRYL AB 3-0 CT1 36IN (SUTURE) ×4
SUT PDS AB 0 CTX 36 PDP370T (SUTURE) ×4 IMPLANT
SUT PLAIN 0 NONE (SUTURE) IMPLANT
SUT VIC AB 2-0 CT1 (SUTURE) ×1 IMPLANT
SUT VIC AB 2-0 CT1 27 (SUTURE)
SUT VIC AB 2-0 CT1 TAPERPNT 27 (SUTURE) IMPLANT
SUT VIC AB 4-0 KS 27 (SUTURE) ×2 IMPLANT
TOWEL OR 17X24 6PK STRL BLUE (TOWEL DISPOSABLE) ×4 IMPLANT
TRAY FOLEY W/BAG SLVR 14FR LF (SET/KITS/TRAYS/PACK) ×2 IMPLANT
WATER STERILE IRR 1000ML POUR (IV SOLUTION) ×2 IMPLANT

## 2022-02-02 NOTE — Progress Notes (Signed)
Subjective:    Coping with contractions and multiple position changes for intrauterine resuscitation.   Objective:    VS: BP 139/80   Pulse 79   Temp 98.1 F (36.7 C) (Oral)   Resp 17   Ht 5\' 4"  (1.626 m)   Wt 99.3 kg   LMP 05/17/2021   SpO2 100%   BMI 37.59 kg/m  FHR : baseline 140 / variability moderate / accelerations absent / variable decelerations IUPC: contractions every 3 minutes / MVU 150 Membranes: AROM x 3 hrs, copious clear fluid Dilation: 7 Effacement (%): 90 Station: 0 Presentation: Vertex  Exam by:: [email or web address], CNM Pitocin off  Assessment/Plan:   31 y.o. 26 [redacted]w[redacted]d Dr. [redacted]w[redacted]d updated on Cat 1 tracing and interventions Starting amnioinfusion  Labor: Progressing normally Fetal Wellbeing:  Category II Pain Control:  Labor support without medications I/D:   GBS pos Anticipated MOD:  NSVD  Connye Burkitt DNP, CNM 02/02/2022 2:18 AM

## 2022-02-02 NOTE — Progress Notes (Signed)
Updated Dr. Connye Burkitt on persistent deep variables. MD to evaluate at bedside.

## 2022-02-02 NOTE — Progress Notes (Signed)
Subjective:    Doing well, coping with contractions with breathing. Plans an unmedicated labor and birth. Discussed AROM and Pitocin and pt agrees. Discussed elevated blood pressures and PCR results. Pt informed of plan to follow BP closely and potentially start medication before discharge.   HA: No Visual changes: No RUQ pain: No  Objective:    VS: BP (!) 146/91   Pulse 86   Temp 98.1 F (36.7 C) (Oral)   Resp 17   Ht 5\' 4"  (1.626 m)   Wt 99.3 kg   LMP 05/17/2021   SpO2 100%   BMI 37.59 kg/m  FHR : baseline 135 / variability moderate / accelerations present. absent decelerations Toco: contractions every 3-6 minutes  Membranes: AROM with FSE, clear Dilation: 6 Effacement (%): 80 Station: -3 Presentation: Vertex (confirmed by bedside ultrasound) Exam by:: [email or web address], CNM  Assessment/Plan:   31 y.o. [phone] [redacted]w[redacted]d  Labor:  AROM with FSE, starting Pitocin, positioned on peanut ball Fetal Wellbeing:  Category I Pain Control:  Labor support without medications I/D:   GBS neg, Ampicillin x 2 doses Anticipated MOD:  NSVD  [redacted]w[redacted]d DNP, CNM 02/02/2022 12:37 AM

## 2022-02-02 NOTE — Op Note (Signed)
Pre Op Dx:   1. Single live IUP at [redacted]w[redacted]d 2. Persistent Category II Fetal Heart Tracing 3. Fetal retrognathia/micrognathia 4. Fetal VSD 5. Polyhydramnios  Post Op Dx:  Same as pre-operative diagnoses  Procedure:  Low Transverse Cesarean Section  Surgeon:  Dr. Katrinka Blazing. Connye Burkitt Assistants:  Rhea Pink, CNM Anesthesia:  Spinal  EBL:  984cc  IVF:  See anesthesia documentation UOP:  200cc clear yellow urine  Drains:  Foley catheter  Specimen removed:  Placenta - sent to pathology Device(s) implanted:  None Case Type:  Clean-contaminated Findings: Normal-appearing uterus, bilateral fallopian tubes, and ovaries. Fetus in cephalic (LOA position but asynclitic), clear amniotic fluid, and nuchal cord x 1. Infant vigorous at delivery - see infant chart for APGARS and weight. Complications: None  Indications:  31 y.o. [id number] at [redacted]w[redacted]d was admitted for labor. Patient had persistent deep variables with a nadir to the 40-50s while at 8cm unresolved with position changes and amnioinfusion.  Procedure:  After informed consent was obtained, the patient was brought to the operating room.  Following administration of spinal anesthesia, the patient was positioned in dorsal supine position with a leftward tilt and was prepped and draped in sterile fashion.  A preoperative time-out was performed.  The abdomen was entered in layers through a pfannenstiel incision and an Teacher, early years/pre was placed.  A low transverse hysterotomy was created sharply to the level of the membranes, then extended bluntly.  The fetus was delivered from cephalic presentation onto the field.  Bulb suctioning was performed.  The cord was doubly clamped and cut after a 60 second pause.  The newborn was passed to the warmer.  The placenta was delivered.  The uterus was swept free of clots and debris and closed in a running locked fashion with 0-Monocryl. A second imbricating layer was used to close the uterus using 0-Monocryl. An  additional figure-of-eight suture was placed in the middle of the hysterotomy for additional hemostasis. Hemostasis was verified.  The abdomen was irrigated with warmed saline and cleared of clots.  The peritoneum was closed in a running fashion with 2-0 Vicryl.  Subfascial spaces were inspected and hemostasis assured.  The fascia was closed in a running fashion with 0-PDS.  The subcutaneous tissues were irrigated and hemostasis assured.  The subcutaneous tissues were closed with 3-0 Monocryl.  The skin was closed with 4-0 Vicryl.  A sterile bandage was applied.  The patient was transferred to PACU.  All needle, sponge, and instrument counts were correct at the end of the case.   Disposition:  PACU  I performed the procedure and the assistant was needed due to the complexity of the anatomy.  Steva Ready, DO

## 2022-02-02 NOTE — Anesthesia Preprocedure Evaluation (Addendum)
Anesthesia Evaluation  Patient identified by MRN, date of birth, ID band Patient awake    Reviewed: Allergy & Precautions, NPO status , Patient's Chart, lab work & pertinent test results  Airway Mallampati: II  TM Distance: >3 FB Neck ROM: Full    Dental no notable dental hx.    Pulmonary neg pulmonary ROS,    Pulmonary exam normal breath sounds clear to auscultation       Cardiovascular negative cardio ROS Normal cardiovascular exam Rhythm:Regular Rate:Normal     Neuro/Psych negative neurological ROS  negative psych ROS   GI/Hepatic negative GI ROS, Neg liver ROS,   Endo/Other  negative endocrine ROS  Renal/GU negative Renal ROS  negative genitourinary   Musculoskeletal negative musculoskeletal ROS (+)   Abdominal (+) + obese,   Peds negative pediatric ROS (+)  Hematology negative hematology ROS (+)   Anesthesia Other Findings   Reproductive/Obstetrics (+) Pregnancy                             Anesthesia Physical Anesthesia Plan  ASA: 2 and emergent  Anesthesia Plan: Spinal   Post-op Pain Management:    Induction: Intravenous  PONV Risk Score and Plan: Treatment may vary due to age or medical condition  Airway Management Planned:   Additional Equipment:   Intra-op Plan:   Post-operative Plan:   Informed Consent: I have reviewed the patients History and Physical, chart, labs and discussed the procedure including the risks, benefits and alternatives for the proposed anesthesia with the patient or authorized representative who has indicated his/her understanding and acceptance.     Dental advisory given  Plan Discussed with: CRNA  Anesthesia Plan Comments:        Anesthesia Quick Evaluation

## 2022-02-02 NOTE — Progress Notes (Signed)
Called by Rhea Pink, CNM due to recurrent variables starting at approximately 0130. Variables now occurring with contractions with nadir of 40-50s. Terbutaline has been given. Patient repositioned and is now in throne position. IV bolus running. Cervix was evaluated previously and was 8/90/0 without ability to push past this dilation with Rhea Pink, CNM.  I counseled the patient on recommendation to proceed with cesarean delivery due to non-reassuring fetal status. Patient is in agreement to proceed. Anesthesia staff and L&D staff made aware. Plan to proceed urgently to the OR.  I have explained to the patient that this surgery is performed to deliver their baby or babies through an incision in the abdomen and incision in the uterus.  Prior to surgery, the risks and benefits of the surgery, as well as alternative treatments were discussed.  The risks include, but are not limited to, possible need for cesarean delivery for all future pregnancies, bleeding at the time of surgery that could necessitate a blood transfusion and/or hysterectomy, rupture of the uterus during a future pregnancy that could cause a preterm delivery and/or requiring hysterectomy, infection, damage to surrounding organs and tissues, damage to bladder, damage to ureters, causing kidney damage, and requiring additional procedures, damage to bowels, resulting in further surgery, postoperative pain, short-term and long-term, scarring on the abdominal wall and intra-abdominally, need for further surgery, development of an incisional hernia, deep vein thrombosis and/or pulmonary embolism, wound infection and/or separation, painful intercourse, urinary leakage, impact on future pregnancies including but not limited to, abnormal location or attachment of the placenta to the uterus, such as placenta previa or accreta, that may necessitate a blood transfusion and/or hysterectomy, impact on total family size, complications the course of which  cannot be predicted or prevented, and death. Patient was consented for blood products.  The patient is aware that bleeding may result in the need for a blood transfusion which includes risk of transmission of HIV (1:2 million), Hepatitis C (1:2 million), and Hepatitis B (1:200 thousand) and transfusion reaction.  Patient voiced understanding of the above risks as well as understanding of indications for blood transfusion.  Steva Ready, DO

## 2022-02-02 NOTE — Anesthesia Procedure Notes (Signed)
Spinal  Patient location during procedure: OB Start time: 02/02/2022 3:36 AM End time: 02/02/2022 3:41 AM Reason for block: surgical anesthesia Staffing Performed: anesthesiologist  Anesthesiologist: Lowella Curb, MD Performed by: Lowella Curb, MD Authorized by: Lowella Curb, MD   Preanesthetic Checklist Completed: patient identified, IV checked, risks and benefits discussed, surgical consent, monitors and equipment checked, pre-op evaluation and timeout performed Spinal Block Patient position: sitting Prep: DuraPrep and site prepped and draped Patient monitoring: heart rate, cardiac monitor, continuous pulse ox and blood pressure Approach: midline Location: L3-4 Injection technique: single-shot Needle Needle type: Pencan  Needle gauge: 24 G Needle length: 10 cm Assessment Sensory level: T4 Events: CSF return

## 2022-02-02 NOTE — Transfer of Care (Signed)
Immediate Anesthesia Transfer of Care Note  Patient: Allison Jones  Procedure(s) Performed: CESAREAN SECTION  Patient Location: PACU  Anesthesia Type:Spinal  Level of Consciousness: awake, alert  and oriented  Airway & Oxygen Therapy: Patient Spontanous Breathing  Post-op Assessment: Report given to RN and Post -op Vital signs reviewed and stable  Post vital signs: Reviewed and stable  Last Vitals:  Vitals Value Taken Time  BP 103/59 02/02/22 0504  Temp    Pulse 54 02/02/22 0507  Resp 20 02/02/22 0507  SpO2 77 % 02/02/22 0507  Vitals shown include unvalidated device data.  Last Pain:  Vitals:   02/02/22 0301  TempSrc: Oral  PainSc:          Complications: No notable events documented.

## 2022-02-02 NOTE — Lactation Note (Signed)
This note was copied from a baby's chart.  NICU Lactation Consultation Note  Patient Name: Allison Jones [id number] Date: 02/02/2022 Age:31 hours   Subjective Reason for consult: Initial assessment LC set up pump and provided initial ed. Mother prefers to initiate pumping later today. We discussed the potential for a delay in copious milk because of volume of pp blood loss.  Mother recalls that [facility] rep instructed her to contact [facility] for a pump for at-home use.   LC provided written information and taught HE. Mother is aware to pump q3h for 15 minutes and bring any EBM to NICU.  Objective Infant data: Mother's Current Feeding Choice: Breast Milk  Infant feeding assessment Cleft palate  Maternal data: [id number]  C-Section, Low Transverse Does the patient have breastfeeding experience prior to this delivery?: No  Risk factor for low milk supply:: pph: >1200cc blood loss   WIC Program: Yes  Assessment Infant: Infant will likely need special feeding system because of cleft palate.  Feeding Status: NPO   Maternal: Mother may have a delay in copious milk because of pph. Mother has normal breast development.   Intervention/Plan Interventions: Education; [employer] brochure  Tools: Pump Pump Education: Setup, frequency, and cleaning; Milk Storage  Plan: Consult Status: NICU follow-up  NICU Follow-up type: New admission follow up; Verify absence of engorgement; Weekly NICU follow up; Maternal D/C visit; Verify onset of copious milk  Mother to f/u with [facility] for at-home pump. Mother to pump q3h for 15 minutes.  Elder Negus 02/02/2022, 9:00 AM

## 2022-02-02 NOTE — Anesthesia Postprocedure Evaluation (Signed)
Anesthesia Post Note  Patient: Allison Jones  Procedure(s) Performed: CESAREAN SECTION     Patient location during evaluation: PACU Anesthesia Type: Spinal Level of consciousness: awake and alert Pain management: pain level controlled Vital Signs Assessment: post-procedure vital signs reviewed and stable Respiratory status: spontaneous breathing, nonlabored ventilation and respiratory function stable Cardiovascular status: blood pressure returned to baseline and stable Postop Assessment: no apparent nausea or vomiting Anesthetic complications: no   No notable events documented.  Last Vitals:  Vitals:   02/02/22 0545 02/02/22 0550  BP: (!) 129/44   Pulse: 96   Resp: (!) 21   Temp:  37 C  SpO2:      Last Pain:  Vitals:   02/02/22 0610  TempSrc:   PainSc: 7    Pain Goal:                Epidural/Spinal Function Cutaneous sensation: No Sensation (02/02/22 0550), Patient able to flex knees: No (02/02/22 0550), Patient able to lift hips off bed: No (02/02/22 0550), Back pain beyond tenderness at insertion site: No (02/02/22 0550), Progressively worsening motor and/or sensory loss: No (02/02/22 0550), Bowel and/or bladder incontinence post epidural: No (02/02/22 0550)  Lowella Curb

## 2022-02-03 LAB — SURGICAL PATHOLOGY

## 2022-02-03 NOTE — Progress Notes (Signed)
MD Progress Note  Allison Jones [id number] POD# 1 s/p primary CS for NRFHT  Subjective: Patient reports pain is well controlled.  Baby is in the NICU with feeding tube Reports tolerating PO, no N/V, ambulating and voiding w/o difficulty  Denies chest pain HA/SOB/dizziness  Flatus No Vaginal bleeding is normal, no clots   Objective:  VS:  Vitals:   02/02/22 1941 02/02/22 2326 02/03/22 0324 02/03/22 0835  BP: 133/70 (!) 114/59 122/64 137/84  Pulse: 86 86 93 91  Resp: 20 19 18 19   Temp: 98.1 F (36.7 C) 98.1 F (36.7 C) 98.3 F (36.8 C) 98.5 F (36.9 C)  TempSrc: Oral Oral Oral Oral  SpO2: 100% 98% 100% 99%  Weight:      Height:        Intake/Output Summary (Last 24 hours) at 02/03/2022 1250 Last data filed at 02/03/2022 02/05/2022 Gross per 24 hour  Intake 2469.85 ml  Output 2000 ml  Net 469.85 ml     Recent Labs    02/01/22 1747 02/02/22 0700  WBC 8.7 16.2*  HGB 10.5* 8.9*  HCT 34.2* 28.2*  PLT 226 201    Blood type: --/--/A POS (07/24 1740) Rubella: Immune (01/12 0000)    Physical Exam:  General: alert, cooperative, and no distress CV: Regular rate and rhythm Resp: clear Abdomen: Soft, non tender, +Distended Incision: clean, dry, intact, and pressure dressing in place Uterine Fundus: firm, below umbilicus Lochia: none Ext: extremities normal, atraumatic, no cyanosis or edema, Homans sign is negative, no sign of DVT, and no edema, redness or tenderness in the calves or thighs   Assessment/Plan: 31 y.o.   POD# 1 s/p primary CS 26                  Encouraged to ambulate more, drink warm fluids, to improve GI motility. Will continue routine postoperative care      IV is already saline locked, will d/c  [id number], MD 02/03/2022, 12:50 PM

## 2022-02-03 NOTE — Lactation Note (Signed)
This note was copied from a baby's chart.  NICU Lactation Consultation Note  Patient Name: Allison Jones [id number] Date: 02/03/2022 Age:31 hours   Subjective Reason for consult: Follow-up assessment Patient pumped last night at midnight but has not yet pumped today. She is aware of importance of frequent pumping. Pt attempted to reach a rep at [facility] today to order a pump but did not receive a response. She will try again later today.  Objective Infant data: Mother's Current Feeding Choice: Breast Milk and Formula   Maternal data: [id number]  C-Section, Low Transverse  Does the patient have breastfeeding experience prior to this delivery?: No  Risk factor for low milk supply:: pp blood loss, infrequent pumping   WIC Program: Yes  Assessment Infant: Feeding Status: NPO   Maternal:  Intervention/Plan Interventions: Education  Tools: Pump Pump Education: Setup, frequency, and cleaning; Milk Storage  Plan: Consult Status: NICU follow-up  NICU Follow-up type: Verify absence of engorgement; Verify onset of copious milk; Maternal D/C visit  Mother to increase pumping to q3h Mother to f/u with [facility] for pump  Elder Negus 02/03/2022, 9:55 AM

## 2022-02-04 ENCOUNTER — Other Ambulatory Visit (HOSPITAL_COMMUNITY): Payer: Self-pay

## 2022-02-04 MED ORDER — SENNOSIDES-DOCUSATE SODIUM 8.6-50 MG PO TABS
2.0000 | ORAL_TABLET | Freq: Every evening | ORAL | 3 refills | Status: DC | PRN
  Filled 2022-02-04 – 2022-03-02 (×2): qty 30, 15d supply, fill #0

## 2022-02-04 MED ORDER — OXYCODONE-ACETAMINOPHEN 5-325 MG PO TABS
1.0000 | ORAL_TABLET | ORAL | 0 refills | Status: DC | PRN
  Filled 2022-02-04: qty 30, 3d supply, fill #0

## 2022-02-04 MED ORDER — FERROUS SULFATE 325 (65 FE) MG PO TABS
ORAL_TABLET | Freq: Two times a day (BID) | ORAL | 4 refills | Status: DC
  Filled 2022-02-04: qty 60, 30d supply, fill #0

## 2022-02-04 MED ORDER — IBUPROFEN 600 MG PO TABS
600.0000 mg | ORAL_TABLET | Freq: Four times a day (QID) | ORAL | 3 refills | Status: DC | PRN
  Filled 2022-02-04: qty 60, 15d supply, fill #0

## 2022-02-04 NOTE — Progress Notes (Signed)
MD Progress Note  Subjective: Postpartum Day 2 s/p primary Cesarean Delivery Patient notes some uterine pain with pumping otherwise pain is well controlled. No heavy bleeding. She has not yet passed gas, no BM, she is voiding w/o difficulty.  Normal po intake no nausea or vomiting.   Objective: Vital signs in last 24 hours: Temp:  [97.4 F (36.3 C)-98.3 F (36.8 C)] 97.4 F (36.3 C) (07/27 0743) Pulse Rate:  [81-97] 81 (07/27 0547) Resp:  [16-18] 16 (07/27 0743) BP: (117-132)/(63-83) 132/83 (07/27 0547) SpO2:  [98 %-100 %] 98 % (07/27 0743)  Physical Exam:  General: alert, cooperative, and no distress Lochia: appropriate Uterine Fundus: firm Abdomen: Soft + distended, non tender Incision: healing well, no dehiscence, no significant erythema, honeycomb dressing in place DVT Evaluation: No evidence of DVT seen on physical exam.  Recent Labs    02/01/22 1747 02/02/22 0700  HGB 10.5* 8.9*  HCT 34.2* 28.2*    Assessment/Plan: Status post Cesarean section. Doing well postoperatively.  Continue current care. Patient desires to be discharged later today, discussed that it depends on her passing flatus,  Encouraged more ambulation.  Will get discharge paperwork done and hold d/c order until later this afternoon.  Discussed discharge precautions and follow-up Baby girl in NICU   Essie Hart, MD 02/04/2022, 9:59 AM

## 2022-02-04 NOTE — Discharge Summary (Signed)
[facility] Discharge Summary   Patient Name:   Allison Jones DOB:     November 04, 1990 MRN:     [id number]  Date of Admission:   02/01/2022 Date of Discharge:  02/04/2022  Admitting diagnosis:    Normal labor [O80, Z37.9] Principal Problem:   Status post primary low transverse cesarean section Active Problems:   History of Wolff-Parkinson-White (WPW) syndrome   Normal labor   HSV-2 infection   Group beta Strep positive   Gestational hypertension w/o significant proteinuria in 3rd trimester    Discharge diagnosis:    Normal labor [O80, Z37.9] Principal Problem:   Status post primary low transverse cesarean section Active Problems:   History of Wolff-Parkinson-White (WPW) syndrome   Normal labor   HSV-2 infection   Group beta Strep positive   Gestational hypertension w/o significant proteinuria in 3rd trimester  Term Pregnancy Delivered        Additional problems: None                                          Post partum procedures: None Augmentation: AROM and Pitocin Complications: [id number]  Hospital course: Induction of Labor With Cesarean Section   31 y.o. yo [id number] at 39w2dwas admitted to the hospital 02/01/2022 for induction of labor. Patient had a labor course significant for Non reassuring fetal heart tracing. The patient went for cesarean section due to Non-Reassuring FHR. Delivery details are as follows: Membrane Rupture Time/Date: 11:27 PM ,02/01/2022   Delivery Method:C-Section, Low Transverse  Details of operation can be found in separate operative Note.  Patient had an uncomplicated postpartum course. She is ambulating, tolerating a regular diet, passing flatus, and urinating well.  Patient is discharged home in stable condition on 02/04/22.      Newborn Data: Birth date:02/02/2022  Birth time:4:00 AM  Gender:Female  Living status:Living  Apgars:6 ,7  Weight:2810 g                                Magnesium Sulfate received:  No BMZ received: No Rhophylac:No MMR:No T-DaP:Given prenatally Flu: No Transfusion:No                                                               Type of Delivery:  Primary cesarean section Delivering Provider: DDrema DallasDate of Delivery:  02/02/22  Newborn Data:  Baby Feeding:   NICU feeding tube Disposition:   NICU  Physical Exam:   Vitals:   02/03/22 1937 02/03/22 2047 02/04/22 0547 02/04/22 0743  BP:  132/72 132/83   Pulse: 90 87 81   Resp:  _0 Temp:  98 F (36.7 C) 97.8 F (36.6 C) (!) 97.4 F (36.3 C)  TempSrc:  Oral Oral Oral  SpO2:   100% 98%  Weight:      Height:       General: alert, cooperative, and no distress Lochia: appropriate Uterine Fundus: firm Incision: Healing well with no significant drainage, No significant erythema, Dressing is clean, dry, and intact DVT Evaluation: No evidence of DVT seen on physical exam. Negative Homan's sign. No  cords or calf tenderness.  Labs: Lab Results  Component Value Date   WBC 16.2 (H) 02/02/2022   HGB 8.9 (L) 02/02/2022   HCT 28.2 (L) 02/02/2022   MCV 68.3 (L) 02/02/2022   PLT 201 02/02/2022      Latest Ref Rng & Units 02/01/2022    5:47 PM  CMP  Glucose 70 - 99 mg/dL 74   BUN 6 - 20 mg/dL <5   Creatinine 0.44 - 1.00 mg/dL 0.38   Sodium 135 - 145 mmol/L 134   Potassium 3.5 - 5.1 mmol/L 3.5   Chloride 98 - 111 mmol/L 106   CO2 22 - 32 mmol/L 20   Calcium 8.9 - 10.3 mg/dL 8.5   Total Protein 6.5 - 8.1 g/dL 6.1   Total Bilirubin 0.3 - 1.2 mg/dL 0.2   Alkaline Phos 38 - 126 U/L 181   AST 15 - 41 U/L 18   ALT 0 - 44 U/L 7     Discharge instruction: per After Visit Summary and "Baby and Me Booklet".  After Visit Meds:  Allergies as of 02/04/2022   No Known Allergies      Medication List     STOP taking these medications    ferrous sulfate 324 (65 Fe) MG Tbec Replaced by: FeroSul 325 (65 FE) MG tablet       TAKE these medications    cholecalciferol 25 MCG (1000 UNIT)  tablet Commonly known as: VITAMIN D3 Take 1,000 Units by mouth daily.   FeroSul 325 (65 FE) MG tablet Generic drug: ferrous sulfate Take by mouth 2 (two) times daily. Replaces: ferrous sulfate 324 (65 Fe) MG Tbec   ibuprofen 600 MG tablet Commonly known as: ADVIL Take 1 tablet (600 mg total) by mouth every 6 (six) hours as needed for moderate pain or cramping.   multivitamin-prenatal 27-0.8 MG Tabs tablet Take 1 tablet by mouth daily at 12 noon.   oxyCODONE-acetaminophen 5-325 MG tablet Commonly known as: PERCOCET/ROXICET Take 1-2 tablets by mouth every 4 (four) hours as needed for moderate pain.   Senexon-S 8.6-50 MG tablet Generic drug: senna-docusate Take 2 tablets by mouth at bedtime as needed for mild constipation or moderate constipation.        Diet: low salt diet  Activity: Advance as tolerated. Pelvic rest for 6 weeks.   Outpatient follow up:1 week Follow up Appt:No future appointments. Follow up visit: No follow-ups on file.  Postpartum contraception: Undecided  02/04/2022 Sanjuana Kava, MD

## 2022-02-04 NOTE — Plan of Care (Signed)
Patient to be discharged home with printed instructions. No concerns noted. Hiawatha Dressel L Dwan Fennel, RN  

## 2022-02-12 ENCOUNTER — Telehealth (HOSPITAL_COMMUNITY): Payer: Self-pay | Admitting: *Deleted

## 2022-02-12 NOTE — Telephone Encounter (Signed)
Mom reports feeling good. No concerns about herself at this time. She did call the OB office regarding some pain with incision. Will go to [facility]U for it now. EPDS= 0 (hospital score=0 )  Baby is still in NICU.  Duffy Rhody, RN  02-12-2022 at 10:24am

## 2022-02-16 DIAGNOSIS — R300 Dysuria: Secondary | ICD-10-CM | POA: Diagnosis not present

## 2022-03-02 ENCOUNTER — Other Ambulatory Visit (HOSPITAL_COMMUNITY): Payer: Self-pay

## 2022-03-23 ENCOUNTER — Encounter: Payer: Self-pay | Admitting: Nurse Practitioner

## 2022-03-23 ENCOUNTER — Ambulatory Visit (INDEPENDENT_AMBULATORY_CARE_PROVIDER_SITE_OTHER): Payer: BC Managed Care – PPO | Admitting: Nurse Practitioner

## 2022-03-23 DIAGNOSIS — Z833 Family history of diabetes mellitus: Secondary | ICD-10-CM | POA: Diagnosis not present

## 2022-03-23 DIAGNOSIS — D649 Anemia, unspecified: Secondary | ICD-10-CM | POA: Diagnosis not present

## 2022-03-23 DIAGNOSIS — Z8249 Family history of ischemic heart disease and other diseases of the circulatory system: Secondary | ICD-10-CM

## 2022-03-23 DIAGNOSIS — Z83438 Family history of other disorder of lipoprotein metabolism and other lipidemia: Secondary | ICD-10-CM

## 2022-03-23 DIAGNOSIS — Z862 Personal history of diseases of the blood and blood-forming organs and certain disorders involving the immune mechanism: Secondary | ICD-10-CM

## 2022-03-23 NOTE — Patient Instructions (Addendum)
Push fluids, especially water Eat a balanced diet Contact lactation specialist at [facility] Department Follow-up as needed  Breastfeeding and Low Milk Supply It is normal to have some problems when you start to breastfeed your new baby. One problem is having a low amount of breast milk. If you have a low milk supply, this may cause your baby not to gain enough weight. Making sure your breasts are emptied during feedings can help prevent a low milk supply. How does low milk supply affect me? You may have other problems if your baby is not emptying your breast or is not able to attach, or latch, to your nipple. Problems include: Breasts that are too full with milk. This is called engorgement. A blocked milk duct. Cracked nipples. Pain in the nipples. Swelling of the breast (mastitis). How does low milk supply affect my baby? If you have a low milk supply, your baby may: Lose too much water in the body (dehydration). Have trouble gaining weight. What actions can I take to manage the problem of low milk supply?  Breastfeed your baby often Breastfeed when you feel like you need to reduce the fullness of your breasts or when your baby shows signs of hunger. This is called "breastfeeding on demand." Do not delay feedings. Feed your baby often. Breastfeed your baby correctly Make sure your baby's mouth attaches to your nipple (latches) properly when breastfeeding. Make sure your baby is in the right position when breastfeeding. Try different positions to find one that helps your baby feed better. Try to empty your breasts of milk at each feeding. This will cause them to make more milk. If your breast is not empty after a feeding, use a pump or squeeze with your hand (hand express) to get the rest of the milk out. Do not give your baby extra formula unless your doctor or breastfeeding specialist (lactation consultant) tells you to do that. Follow these instructions at home: Medicines Let  your doctor know what over-the-counter or prescription medicines you are taking. Some medicines may affect how much milk you make. Talk to your doctor or breastfeeding specialist before you take any medicines or herbal supplements that may increase your milk supply. Document Revised: 11/03/2021 Document Reviewed: 02/13/2020 Elsevier Patient Education  2023 [employer].

## 2022-03-23 NOTE — Progress Notes (Signed)
Subjective:  Patient ID: Allison Jones, female    DOB: [birth year]  Age: 31 y.o. MRN: [id number]  Chief Complaint  Patient presents with   Weight Loss    HPI  Pt presents for evaluation of breastfeeding difficulty. States she is concerned with not enough milk supply for her infant. She is 6-weeks post-partum. She is pumping every 2 hours. Baby remains in the NICU at [facility]. Pt states her baby had a small chin that required surgical repair, baby is unable to suck at this time. Pt has a toddler at home. States she is pushing fluids and eating a balanced diet.She has continued prenatal vitamins and iron supplements.    Current Outpatient Medications on File Prior to Visit  Medication Sig Dispense Refill   cholecalciferol (VITAMIN D3) 25 MCG (1000 UNIT) tablet Take 1,000 Units by mouth daily.     ferrous sulfate 325 (65 FE) MG tablet Take by mouth 2 (two) times daily. 90 tablet 4   ibuprofen (ADVIL) 600 MG tablet Take 1 tablet (600 mg total) by mouth every 6 (six) hours as needed for moderate pain or cramping. 60 tablet 3   Prenatal Vit-Fe Fumarate-FA (MULTIVITAMIN-PRENATAL) 27-0.8 MG TABS tablet Take 1 tablet by mouth daily at 12 noon.     senna-docusate (SENOKOT-S) 8.6-50 MG tablet Take 2 tablets by mouth at bedtime as needed for mild constipation or moderate constipation. 30 tablet 3   No current facility-administered medications on file prior to visit.   Past Medical History:  Diagnosis Date   H/O varicella    Increased BMI 11/20/2009   Wolff-Parkinson-White syndrome    Yeast infection    Past Surgical History:  Procedure Laterality Date   CARDIAC ELECTROPHYSIOLOGY STUDY AND ABLATION     CESAREAN SECTION  02/02/2022   Procedure: CESAREAN SECTION;  Surgeon: Steva Ready, DO;  Location: [facility] LD ORS;  Service: Obstetrics;;   TONSILLECTOMY      Family History  Problem Relation Age of Onset   Diabetes Maternal Grandmother    Heart disease Maternal Grandmother    Hypertension  Father    Diabetes Mother    Hypertension Mother    Diabetes Maternal Aunt    Diabetes Maternal Aunt    Social History   Socioeconomic History   Marital status: Single    Spouse name: Not on file   Number of children: Not on file   Years of education: Not on file   Highest education level: Not on file  Occupational History   Not on file  Tobacco Use   Smoking status: Never   Smokeless tobacco: Never  Vaping Use   Vaping Use: Never used  Substance and Sexual Activity   Alcohol use: Not Currently    Alcohol/week: 0.0 standard drinks of alcohol   Drug use: No   Sexual activity: Not Currently    Birth control/protection: None    Comment: 1st intercourse 31 yo-Fewer than 5 partners  Other Topics Concern   Not on file  Social History Narrative   Not on file   Social Determinants of Health   Financial Resource Strain: Low Risk  (01/11/2022)   Overall Financial Resource Strain (CARDIA)    Difficulty of Paying Living Expenses: Not hard at all  Food Insecurity: No Food Insecurity (01/11/2022)   Hunger Vital Sign    Worried About Running Out of Food in the Last Year: Never true    Ran Out of Food in the Last Year: Never true  Transportation Needs: No  Transportation Needs (01/11/2022)   PRAPARE - Administrator, Civil Service (Medical): No    Lack of Transportation (Non-Medical): No  Physical Activity: Insufficiently Active (01/11/2022)   Exercise Vital Sign    Days of Exercise per Week: 2 days    Minutes of Exercise per Session: 20 min  Stress: No Stress Concern Present (01/11/2022)   [employer] of Occupational Health - Occupational Stress Questionnaire    Feeling of Stress : Not at all  Social Connections: Moderately Integrated (01/11/2022)   Social Connection and Isolation Panel [NHANES]    Frequency of Communication with Friends and Family: More than three times a week    Frequency of Social Gatherings with Friends and Family: More than three times a week     Attends Religious Services: More than 4 times per year    Active Member of [employer] or Organizations: No    Attends Banker Meetings: Never    Marital Status: Living with partner    Review of Systems  Constitutional:  Negative for appetite change, fatigue and fever.  HENT:  Negative for congestion, ear pain, sinus pressure and sore throat.   Respiratory:  Negative for cough, chest tightness, shortness of breath and wheezing.   Cardiovascular:  Negative for chest pain and palpitations.  Gastrointestinal:  Negative for abdominal pain, constipation, diarrhea, nausea and vomiting.  Genitourinary:  Negative for dysuria and hematuria.  Musculoskeletal:  Negative for arthralgias, back pain, joint swelling and myalgias.  Skin:  Negative for rash.  Neurological:  Negative for dizziness, weakness and headaches.  Psychiatric/Behavioral:  Negative for dysphoric mood. The patient is not nervous/anxious.      Objective:  BP 122/72 (BP Location: Left Arm, Patient Position: Sitting)   Pulse 73   Temp 97.7 F (36.5 C) (Temporal)   Ht 5' 4.5" (1.638 m)   Wt 182 lb (82.6 kg)   LMP 05/17/2021   SpO2 99%   Breastfeeding Yes   BMI 30.76 kg/m      03/23/2022    1:33 PM 02/04/2022    5:47 AM 02/03/2022    8:47 PM  BP/Weight  Systolic BP 122 132 132  Diastolic BP 72 83 72  Wt. (Lbs) 182    BMI 30.76 kg/m2      Physical Exam Vitals reviewed.  Constitutional:      Appearance: Normal appearance. She is normal weight.  Cardiovascular:     Rate and Rhythm: Normal rate and regular rhythm.     Heart sounds: Normal heart sounds.  Pulmonary:     Effort: Pulmonary effort is normal.     Breath sounds: Normal breath sounds.  Abdominal:     General: Abdomen is flat. Bowel sounds are normal.     Palpations: Abdomen is soft.  Neurological:     Mental Status: She is alert and oriented to person, place, and time.  Psychiatric:        Mood and Affect: Mood normal.        Behavior: Behavior  normal.         Lab Results  Component Value Date   WBC 16.2 (H) 02/02/2022   HGB 8.9 (L) 02/02/2022   HCT 28.2 (L) 02/02/2022   PLT 201 02/02/2022   GLUCOSE 74 02/01/2022   CHOL 171 05/28/2021   TRIG 41 05/28/2021   HDL 67 05/28/2021   LDLCALC 95 05/28/2021   ALT 7 02/01/2022   AST 18 02/01/2022   NA 134 (L) 02/01/2022   K  3.5 02/01/2022   CL 106 02/01/2022   CREATININE 0.38 (L) 02/01/2022   BUN <5 (L) 02/01/2022   CO2 20 (L) 02/01/2022   TSH 1.460 05/28/2021   HGBA1C 5.3 05/28/2021      Assessment & Plan:   1. Lactating mother - Prolactin - CBC with Differential/Platelet - Comprehensive metabolic panel - T4, free - TSH -push fluids, especially water -eat a healthy well-balanced diet  2. Family history of diabetes mellitus (DM) - CBC with Differential/Platelet - Comprehensive metabolic panel - Hemoglobin A1c  3. Family history of hypertension - CBC with Differential/Platelet - Comprehensive metabolic panel - T4, free - TSH  4. Family history of hyperlipidemia - CBC with Differential/Platelet - Comprehensive metabolic panel - Lipid panel  5. History of anemia - CBC with Differential/Platelet - Comprehensive metabolic panel     Push fluids, especially water Eat a balanced diet Contact lactation specialist at [facility] Department Follow-up as needed  Follow-up: PRN  An After Visit Summary was printed and given to the patient.  I, Janie Morning, NP, have reviewed all documentation for this visit. The documentation on 03/23/22 for the exam, diagnosis, procedures, and orders are all accurate and complete.   I,Lauren M Auman,acting as a Neurosurgeon for [employer], NP.,have documented all relevant documentation on the behalf of Janie Morning, NP,as directed by  Janie Morning, NP while in the presence of Janie Morning, NP.   Signed, Janie Morning, NP [facility] [phone]

## 2022-03-24 LAB — CBC WITH DIFFERENTIAL/PLATELET
Basophils Absolute: 0 10*3/uL (ref 0.0–0.2)
Basos: 1 %
EOS (ABSOLUTE): 0.1 10*3/uL (ref 0.0–0.4)
Eos: 2 %
Hematocrit: 35.2 % (ref 34.0–46.6)
Hemoglobin: 10.6 g/dL — ABNORMAL LOW (ref 11.1–15.9)
Immature Grans (Abs): 0 10*3/uL (ref 0.0–0.1)
Immature Granulocytes: 0 %
Lymphocytes Absolute: 2.5 10*3/uL (ref 0.7–3.1)
Lymphs: 50 %
MCH: 21.1 pg — ABNORMAL LOW (ref 26.6–33.0)
MCHC: 30.1 g/dL — ABNORMAL LOW (ref 31.5–35.7)
MCV: 70 fL — ABNORMAL LOW (ref 79–97)
Monocytes Absolute: 0.4 10*3/uL (ref 0.1–0.9)
Monocytes: 9 %
Neutrophils Absolute: 1.9 10*3/uL (ref 1.4–7.0)
Neutrophils: 38 %
Platelets: 325 10*3/uL (ref 150–450)
RBC: 5.02 x10E6/uL (ref 3.77–5.28)
RDW: 20 % — ABNORMAL HIGH (ref 11.7–15.4)
WBC: 5 10*3/uL (ref 3.4–10.8)

## 2022-03-24 LAB — COMPREHENSIVE METABOLIC PANEL
ALT: 36 IU/L — ABNORMAL HIGH (ref 0–32)
AST: 38 IU/L (ref 0–40)
Albumin/Globulin Ratio: 1.2 (ref 1.2–2.2)
Albumin: 4.1 g/dL (ref 4.0–5.0)
Alkaline Phosphatase: 121 IU/L (ref 44–121)
BUN/Creatinine Ratio: 17 (ref 9–23)
BUN: 8 mg/dL (ref 6–20)
Bilirubin Total: 0.2 mg/dL (ref 0.0–1.2)
CO2: 19 mmol/L — ABNORMAL LOW (ref 20–29)
Calcium: 9.2 mg/dL (ref 8.7–10.2)
Chloride: 101 mmol/L (ref 96–106)
Creatinine, Ser: 0.46 mg/dL — ABNORMAL LOW (ref 0.57–1.00)
Globulin, Total: 3.3 g/dL (ref 1.5–4.5)
Glucose: 84 mg/dL (ref 70–99)
Potassium: 4.7 mmol/L (ref 3.5–5.2)
Sodium: 136 mmol/L (ref 134–144)
Total Protein: 7.4 g/dL (ref 6.0–8.5)
eGFR: 132 mL/min/{1.73_m2} (ref >59)

## 2022-03-24 LAB — LIPID PANEL
Chol/HDL Ratio: 2.5 ratio (ref 0.0–4.4)
Cholesterol, Total: 194 mg/dL (ref 100–199)
HDL: 77 mg/dL (ref >39)
LDL Chol Calc (NIH): 104 mg/dL — ABNORMAL HIGH (ref 0–99)
Triglycerides: 74 mg/dL (ref 0–149)
VLDL Cholesterol Cal: 13 mg/dL (ref 5–40)

## 2022-03-24 LAB — HEMOGLOBIN A1C
Est. average glucose Bld gHb Est-mCnc: 94 mg/dL
Hgb A1c MFr Bld: 4.9 % (ref 4.8–5.6)

## 2022-03-24 LAB — PROLACTIN: Prolactin: 22.4 ng/mL (ref 4.8–23.3)

## 2022-03-24 LAB — CARDIOVASCULAR RISK ASSESSMENT

## 2022-03-24 LAB — T4, FREE: Free T4: 1.16 ng/dL (ref 0.82–1.77)

## 2022-03-24 LAB — TSH: TSH: 1.14 u[IU]/mL (ref 0.450–4.500)

## 2022-03-26 LAB — IRON AND TIBC
Iron Saturation: 11 % — ABNORMAL LOW (ref 15–55)
Iron: 40 ug/dL (ref 27–159)
Total Iron Binding Capacity: 364 ug/dL (ref 250–450)
UIBC: 324 ug/dL (ref 131–425)

## 2022-03-26 LAB — SPECIMEN STATUS REPORT

## 2022-03-26 LAB — FERRITIN: Ferritin: 28 ng/mL (ref 15–150)

## 2022-04-23 ENCOUNTER — Ambulatory Visit: Payer: BC Managed Care – PPO | Admitting: Nurse Practitioner

## 2022-05-12 ENCOUNTER — Ambulatory Visit: Payer: BC Managed Care – PPO | Admitting: Nurse Practitioner

## 2022-05-13 ENCOUNTER — Encounter: Payer: Self-pay | Admitting: Nurse Practitioner

## 2022-05-13 ENCOUNTER — Ambulatory Visit (INDEPENDENT_AMBULATORY_CARE_PROVIDER_SITE_OTHER): Payer: BC Managed Care – PPO | Admitting: Nurse Practitioner

## 2022-05-13 VITALS — BP 136/82 | HR 85 | Temp 97.6°F | Ht 64.5 in | Wt 185.0 lb

## 2022-05-13 DIAGNOSIS — Z2821 Immunization not carried out because of patient refusal: Secondary | ICD-10-CM

## 2022-05-13 DIAGNOSIS — Z0000 Encounter for general adult medical examination without abnormal findings: Secondary | ICD-10-CM | POA: Diagnosis not present

## 2022-05-13 DIAGNOSIS — L539 Erythematous condition, unspecified: Secondary | ICD-10-CM | POA: Diagnosis not present

## 2022-05-13 DIAGNOSIS — R0989 Other specified symptoms and signs involving the circulatory and respiratory systems: Secondary | ICD-10-CM

## 2022-05-13 DIAGNOSIS — R0981 Nasal congestion: Secondary | ICD-10-CM | POA: Diagnosis not present

## 2022-05-13 DIAGNOSIS — D649 Anemia, unspecified: Secondary | ICD-10-CM | POA: Diagnosis not present

## 2022-05-13 DIAGNOSIS — Z20828 Contact with and (suspected) exposure to other viral communicable diseases: Secondary | ICD-10-CM

## 2022-05-13 LAB — POCT INFLUENZA A/B
Influenza A, POC: NEGATIVE
Influenza B, POC: NEGATIVE

## 2022-05-13 LAB — POC COVID19 BINAXNOW: SARS Coronavirus 2 Ag: NEGATIVE

## 2022-05-13 NOTE — Patient Instructions (Addendum)
We will call you with lab results Follow-up in 1-year for physical exam   Preventive Care 31-31 Years Old, Female Preventive care refers to lifestyle choices and visits with your health care provider that can promote health and wellness. Preventive care visits are also called wellness exams. What can I expect for my preventive care visit? Counseling During your preventive care visit, your health care provider may ask about your: Medical history, including: Past medical problems. Family medical history. Pregnancy history. Current health, including: Menstrual cycle. Method of birth control. Emotional well-being. Home life and relationship well-being. Sexual activity and sexual health. Lifestyle, including: Alcohol, nicotine or tobacco, and drug use. Access to firearms. Diet, exercise, and sleep habits. Work and work Statistician. Sunscreen use. Safety issues such as seatbelt and bike helmet use. Physical exam Your health care provider may check your: Height and weight. These may be used to calculate your BMI (body mass index). BMI is a measurement that tells if you are at a healthy weight. Waist circumference. This measures the distance around your waistline. This measurement also tells if you are at a healthy weight and may help predict your risk of certain diseases, such as type 2 diabetes and high blood pressure. Heart rate and blood pressure. Body temperature. Skin for abnormal spots. What immunizations do I need?  Vaccines are usually given at various ages, according to a schedule. Your health care provider will recommend vaccines for you based on your age, medical history, and lifestyle or other factors, such as travel or where you work. What tests do I need? Screening Your health care provider may recommend screening tests for certain conditions. This may include: Pelvic exam and Pap test. Lipid and cholesterol levels. Diabetes screening. This is done by checking your  blood sugar (glucose) after you have not eaten for a while (fasting). Hepatitis B test. Hepatitis C test. HIV (human immunodeficiency virus) test. STI (sexually transmitted infection) testing, if you are at risk. BRCA-related cancer screening. This may be done if you have a family history of breast, ovarian, tubal, or peritoneal cancers. Talk with your health care provider about your test results, treatment options, and if necessary, the need for more tests. Follow these instructions at home: Eating and drinking  Eat a healthy diet that includes fresh fruits and vegetables, whole grains, lean protein, and low-fat dairy products. Take vitamin and mineral supplements as recommended by your health care provider. Do not drink alcohol if: Your health care provider tells you not to drink. You are pregnant, may be pregnant, or are planning to become pregnant. If you drink alcohol: Limit how much you have to 31-1 drink a day. Know how much alcohol is in your drink. In the [place] one drink equals one 12 oz bottle of beer (355 mL), one 5 oz glass of wine (148 mL), or one 1 oz glass of hard liquor (44 mL). Lifestyle Brush your teeth every morning and night with fluoride toothpaste. Floss one time each day. Exercise for at least 31 minutes 5 or more days each week. Do not use any products that contain nicotine or tobacco. These products include cigarettes, chewing tobacco, and vaping devices, such as e-cigarettes. If you need help quitting, ask your health care provider. Do not use drugs. If you are sexually active, practice safe sex. Use a condom or other form of protection to prevent STIs. If you do not wish to become pregnant, use a form of birth control. If you plan to become pregnant, see your health  care provider for a prepregnancy visit. Find healthy ways to manage stress, such as: Meditation, yoga, or listening to music. Journaling. Talking to a trusted person. Spending time with friends and  family. Minimize exposure to UV radiation to reduce your risk of skin cancer. Safety Always wear your seat belt while driving or riding in a vehicle. Do not drive: If you have been drinking alcohol. Do not ride with someone who has been drinking. If you have been using any mind-altering substances or drugs. While texting. When you are tired or distracted. Wear a helmet and other protective equipment during sports activities. If you have firearms in your house, make sure you follow all gun safety procedures. Seek help if you have been physically or sexually abused. What's next? Go to your health care provider once a year for an annual wellness visit. Ask your health care provider how often you should have your eyes and teeth checked. Stay up to date on all vaccines. This information is not intended to replace advice given to you by your health care provider. Make sure you discuss any questions you have with your health care provider. Document Revised: 12/24/2020 Document Reviewed: 12/24/2020 Elsevier Patient Education  [place].

## 2022-05-13 NOTE — Progress Notes (Signed)
Subjective:  Patient ID: Allison Jones, female    DOB: [birth year]  Age: 31 y.o. MRN: [id number]  Chief Complaint  Patient presents with   Annual Exam    HPI Encounter for general adult medical examination without abnormal findings  Physical ("At Risk" items are starred): Patient's last physical exam was 1 year ago .   G[employer] SmartLinks can only be used for patients less than 40 years old.   SDOH Screenings   Food Insecurity: No Food Insecurity (01/11/2022)  Housing: Low Risk  (01/11/2022)  Transportation Needs: No Transportation Needs (01/11/2022)  Alcohol Screen: Low Risk  (05/13/2022)  Depression (PHQ2-9): Low Risk  (05/13/2022)  Financial Resource Strain: Low Risk  (01/11/2022)  Physical Activity: Insufficiently Active (01/11/2022)  Social Connections: Moderately Integrated (01/11/2022)  Stress: No Stress Concern Present (01/11/2022)  Tobacco Use: Low Risk  (03/23/2022)       05/13/2022    7:43 AM 01/11/2022    9:49 AM  Fall Risk   Falls in the past year? 0 0  Number falls in past yr: 0 0  Injury with Fall? 0 0  Risk for fall due to : No Fall Risks No Fall Risks  Follow up Falls evaluation completed Falls evaluation completed       05/13/2022    7:43 AM 05/28/2021   10:13 AM 05/19/2020   11:16 AM  Depression screen PHQ 2/9  Decreased Interest 0 0 0  Down, Depressed, Hopeless 0 0 0  PHQ - 2 Score 0 0 0  Altered sleeping 0    Tired, decreased energy 0    Change in appetite 0    Feeling bad or failure about yourself  0    Trouble concentrating 0    Moving slowly or fidgety/restless 0    Suicidal thoughts 0    PHQ-9 Score 0    Difficult doing work/chores Not difficult at all      Functional Status Survey:     Safety: reviewed ;  Patient wears a seat belt. Patient's home has smoke detectors and carbon monoxide detectors. Patient does not practice appropriate gun safety Patient wears sunscreen with extended sun exposure. Dental Care: biannual cleanings, brushes and  flosses sometimes. Ophthalmology/Optometry: Annual visit.  Hearing loss: none Vision impairments: none Patient is not afflicted from Stress Incontinence and Urge Incontinence   Current Outpatient Medications on File Prior to Visit  Medication Sig Dispense Refill   cholecalciferol (VITAMIN D3) 25 MCG (1000 UNIT) tablet Take 1,000 Units by mouth daily.     ferrous sulfate 325 (65 FE) MG tablet Take by mouth 2 (two) times daily. 90 tablet 4   ibuprofen (ADVIL) 600 MG tablet Take 1 tablet (600 mg total) by mouth every 6 (six) hours as needed for moderate pain or cramping. 60 tablet 3   Prenatal Vit-Fe Fumarate-FA (MULTIVITAMIN-PRENATAL) 27-0.8 MG TABS tablet Take 1 tablet by mouth daily at 12 noon.     senna-docusate (SENOKOT-S) 8.6-50 MG tablet Take 2 tablets by mouth at bedtime as needed for mild constipation or moderate constipation. 30 tablet 3   No current facility-administered medications on file prior to visit.    Social Hx   Social History   Socioeconomic History   Marital status: Single    Spouse name: Not on file   Number of children: Not on file   Years of education: Not on file   Highest education level: Not on file  Occupational History   Not on file  Tobacco Use  Smoking status: Never   Smokeless tobacco: Never  Vaping Use   Vaping Use: Never used  Substance and Sexual Activity   Alcohol use: Not Currently    Alcohol/week: 0.0 standard drinks of alcohol   Drug use: No   Sexual activity: Not Currently    Birth control/protection: None    Comment: 1st intercourse 31 yo-Fewer than 5 partners  Other Topics Concern   Not on file  Social History Narrative   Not on file   Social Determinants of Health   Financial Resource Strain: Low Risk  (01/11/2022)   Overall Financial Resource Strain (CARDIA)    Difficulty of Paying Living Expenses: Not hard at all  Food Insecurity: No Food Insecurity (01/11/2022)   Hunger Vital Sign    Worried About Running Out of Food in the  Last Year: Never true    Ran Out of Food in the Last Year: Never true  Transportation Needs: No Transportation Needs (01/11/2022)   PRAPARE - Administrator, Civil Service (Medical): No    Lack of Transportation (Non-Medical): No  Physical Activity: Insufficiently Active (01/11/2022)   Exercise Vital Sign    Days of Exercise per Week: 2 days    Minutes of Exercise per Session: 20 min  Stress: No Stress Concern Present (01/11/2022)   [employer] of Occupational Health - Occupational Stress Questionnaire    Feeling of Stress : Not at all  Social Connections: Moderately Integrated (01/11/2022)   Social Connection and Isolation Panel [NHANES]    Frequency of Communication with Friends and Family: More than three times a week    Frequency of Social Gatherings with Friends and Family: More than three times a week    Attends Religious Services: More than 4 times per year    Active Member of [employer] or Organizations: No    Attends Banker Meetings: Never    Marital Status: Living with partner   Past Medical History:  Diagnosis Date   H/O varicella    Increased BMI 11/20/2009   Wolff-Parkinson-White syndrome    Yeast infection    Family History  Problem Relation Age of Onset   Diabetes Maternal Grandmother    Heart disease Maternal Grandmother    Hypertension Father    Diabetes Mother    Hypertension Mother    Diabetes Maternal Aunt    Diabetes Maternal Aunt     Review of Systems  Constitutional:  Negative for chills, fatigue and fever.  HENT:  Positive for congestion, postnasal drip, rhinorrhea and sore throat. Negative for ear pain.   Respiratory:  Positive for cough. Negative for shortness of breath.   Cardiovascular:  Negative for chest pain.  Gastrointestinal:  Negative for abdominal pain, constipation, diarrhea, nausea and vomiting.  Genitourinary:  Negative for dysuria and urgency.  Musculoskeletal:  Negative for back pain and myalgias.  Neurological:   Negative for dizziness, weakness, light-headedness and headaches.  Psychiatric/Behavioral:  Negative for dysphoric mood. The patient is not nervous/anxious.      Objective:  Pulse 85   Temp 97.6 F (36.4 C)   Ht 5' 4.5" (1.638 m)   Wt 185 lb (83.9 kg)   SpO2 100%   BMI 31.26 kg/m      05/13/2022    7:41 AM 03/23/2022    1:33 PM 02/04/2022    5:47 AM  BP/Weight  Systolic BP  122 161  Diastolic BP  72 83  Wt. (Lbs) 185 182   BMI 31.26 kg/m2 30.76 kg/m2  Physical Exam Vitals reviewed.  Constitutional:      Appearance: Normal appearance.  HENT:     Right Ear: Tympanic membrane normal.     Left Ear: Tympanic membrane normal.     Nose: Congestion and rhinorrhea present.     Mouth/Throat:     Pharynx: Posterior oropharyngeal erythema present.  Eyes:     Pupils: Pupils are equal, round, and reactive to light.  Cardiovascular:     Rate and Rhythm: Normal rate and regular rhythm.     Pulses: Normal pulses.     Heart sounds: Normal heart sounds.  Pulmonary:     Effort: Pulmonary effort is normal.     Breath sounds: Normal breath sounds.  Abdominal:     General: Bowel sounds are normal.  Musculoskeletal:        General: Normal range of motion.     Cervical back: Neck supple.  Lymphadenopathy:     Cervical: No cervical adenopathy.  Skin:    General: Skin is warm and dry.     Capillary Refill: Capillary refill takes less than 2 seconds.  Neurological:     General: No focal deficit present.     Mental Status: She is alert and oriented to person, place, and time.  Psychiatric:        Mood and Affect: Mood normal.        Behavior: Behavior normal.     Lab Results  Component Value Date   WBC 5.0 03/23/2022   HGB 10.6 (L) 03/23/2022   HCT 35.2 03/23/2022   PLT 325 03/23/2022   GLUCOSE 84 03/23/2022   CHOL 194 03/23/2022   TRIG 74 03/23/2022   HDL 77 03/23/2022   LDLCALC 104 (H) 03/23/2022   ALT 36 (H) 03/23/2022   AST 38 03/23/2022   NA 136 03/23/2022   K  4.7 03/23/2022   CL 101 03/23/2022   CREATININE 0.46 (L) 03/23/2022   BUN 8 03/23/2022   CO2 19 (L) 03/23/2022   TSH 1.140 03/23/2022   HGBA1C 4.9 03/23/2022      Assessment & Plan:    1. Routine medical exam - Lipid panel - CBC with Differential/Platelet - Comprehensive metabolic panel - T4, free - TSH  2. Influenza vaccination declined      These are the goals we discussed:  Goals   Weight loss      This is a list of the screening recommended for you and due dates:  Health Maintenance  Topic Date Due   Tetanus Vaccine  Never done   Flu Shot  02/09/2022   COVID-19 Vaccine (1) 06/13/2022*   Pap Smear  10/21/2022   Hepatitis C Screening: USPSTF Recommendation to screen - Ages 18-79 yo.  Completed   HIV Screening  Completed   HPV Vaccine  Aged Out  *Topic was postponed. The date shown is not the original due date.      AN INDIVIDUALIZED CARE PLAN: was established or reinforced today.   SELF MANAGEMENT: The patient and I together assessed ways to personally work towards obtaining the recommended goals  Support needs The patient and/or family needs were assessed and services were offered and not necessary at this time.    Follow-up: 1-year  I, Janie Morning, NP, have reviewed all documentation for this visit. The documentation on 05/13/22 for the exam, diagnosis, procedures, and orders are all accurate and complete.   Signed, Flonnie Hailstone, DNP [facility] [phone]

## 2022-05-14 LAB — CBC WITH DIFFERENTIAL/PLATELET
Basophils Absolute: 0.1 10*3/uL (ref 0.0–0.2)
Basos: 1 %
EOS (ABSOLUTE): 0.2 10*3/uL (ref 0.0–0.4)
Eos: 3 %
Hematocrit: 33.7 % — ABNORMAL LOW (ref 34.0–46.6)
Hemoglobin: 9.7 g/dL — ABNORMAL LOW (ref 11.1–15.9)
Immature Grans (Abs): 0 10*3/uL (ref 0.0–0.1)
Immature Granulocytes: 0 %
Lymphocytes Absolute: 1.7 10*3/uL (ref 0.7–3.1)
Lymphs: 29 %
MCH: 19.7 pg — ABNORMAL LOW (ref 26.6–33.0)
MCHC: 28.8 g/dL — ABNORMAL LOW (ref 31.5–35.7)
MCV: 69 fL — ABNORMAL LOW (ref 79–97)
Monocytes Absolute: 0.6 10*3/uL (ref 0.1–0.9)
Monocytes: 11 %
Neutrophils Absolute: 3.3 10*3/uL (ref 1.4–7.0)
Neutrophils: 56 %
Platelets: 325 10*3/uL (ref 150–450)
RBC: 4.92 x10E6/uL (ref 3.77–5.28)
RDW: 17.2 % — ABNORMAL HIGH (ref 11.7–15.4)
WBC: 5.9 10*3/uL (ref 3.4–10.8)

## 2022-05-14 LAB — COMPREHENSIVE METABOLIC PANEL WITH GFR
ALT: 20 IU/L (ref 0–32)
AST: 27 IU/L (ref 0–40)
Albumin/Globulin Ratio: 1.3 (ref 1.2–2.2)
Albumin: 4.1 g/dL (ref 4.0–5.0)
Alkaline Phosphatase: 106 IU/L (ref 44–121)
BUN/Creatinine Ratio: 22 (ref 9–23)
BUN: 12 mg/dL (ref 6–20)
Bilirubin Total: <0.2 mg/dL (ref 0.0–1.2)
CO2: 23 mmol/L (ref 20–29)
Calcium: 9.5 mg/dL (ref 8.7–10.2)
Chloride: 102 mmol/L (ref 96–106)
Creatinine, Ser: 0.55 mg/dL — ABNORMAL LOW (ref 0.57–1.00)
Globulin, Total: 3.2 g/dL (ref 1.5–4.5)
Glucose: 93 mg/dL (ref 70–99)
Potassium: 4.7 mmol/L (ref 3.5–5.2)
Sodium: 137 mmol/L (ref 134–144)
Total Protein: 7.3 g/dL (ref 6.0–8.5)
eGFR: 126 mL/min/1.73

## 2022-05-14 LAB — LIPID PANEL
Chol/HDL Ratio: 2.2 ratio (ref 0.0–4.4)
Cholesterol, Total: 167 mg/dL (ref 100–199)
HDL: 76 mg/dL
LDL Chol Calc (NIH): 80 mg/dL (ref 0–99)
Triglycerides: 57 mg/dL (ref 0–149)
VLDL Cholesterol Cal: 11 mg/dL (ref 5–40)

## 2022-05-14 LAB — CARDIOVASCULAR RISK ASSESSMENT

## 2022-05-14 LAB — T4, FREE: Free T4: 1.08 ng/dL (ref 0.82–1.77)

## 2022-05-14 LAB — TSH: TSH: 1.38 u[IU]/mL (ref 0.450–4.500)

## 2022-05-27 LAB — IRON AND TIBC
Iron Saturation: 5 % — CL (ref 15–55)
Iron: 21 ug/dL — ABNORMAL LOW (ref 27–159)
Total Iron Binding Capacity: 384 ug/dL (ref 250–450)
UIBC: 363 ug/dL (ref 131–425)

## 2022-05-27 LAB — B12 AND FOLATE PANEL
Folate: 4.8 ng/mL (ref >3.0)
Vitamin B-12: 723 pg/mL (ref 232–1245)

## 2022-05-27 LAB — SPECIMEN STATUS REPORT

## 2022-10-27 ENCOUNTER — Ambulatory Visit (INDEPENDENT_AMBULATORY_CARE_PROVIDER_SITE_OTHER): Payer: Medicaid Other | Admitting: Physician Assistant

## 2022-10-27 ENCOUNTER — Encounter: Payer: Self-pay | Admitting: Physician Assistant

## 2022-10-27 VITALS — BP 128/72 | HR 94 | Temp 97.5°F | Ht 64.5 in | Wt 206.0 lb

## 2022-10-27 DIAGNOSIS — J060 Acute laryngopharyngitis: Secondary | ICD-10-CM

## 2022-10-27 LAB — POC COVID19 BINAXNOW: SARS Coronavirus 2 Ag: NEGATIVE

## 2022-10-27 MED ORDER — AMOXICILLIN 875 MG PO TABS: 875.0000 mg | ORAL_TABLET | Freq: Two times a day (BID) | ORAL | 0 refills | Status: AC

## 2022-10-27 MED ORDER — BENZONATATE 100 MG PO CAPS: 100.0000 mg | ORAL_CAPSULE | Freq: Two times a day (BID) | ORAL | 0 refills | Status: DC | PRN

## 2022-10-27 MED ORDER — LORATADINE 10 MG PO TABS: 10.0000 mg | ORAL_TABLET | Freq: Every day | ORAL | 3 refills | Status: DC

## 2022-10-27 NOTE — Progress Notes (Signed)
Acute Office Visit  Subjective:    Patient ID: Allison Jones, female    DOB: April 23, 1991, 32 y.o.   MRN: [id number]  Chief Complaint  Patient presents with   Cough    HPI: Patient is in today for complaints of cough and congestion for the past several weeks - she has been seen at Urgent Care twice and treated with prednisone but unsure of other medications.  She is not taking any decongestants at this time Complains of runny nose, pnd, cough - denies fever/malaise Denies wheezing  Past Medical History:  Diagnosis Date   H/O varicella    Increased BMI 11/20/2009   Wolff-Parkinson-White syndrome    Yeast infection     Past Surgical History:  Procedure Laterality Date   CARDIAC ELECTROPHYSIOLOGY STUDY AND ABLATION     CESAREAN SECTION  02/02/2022   Procedure: CESAREAN SECTION;  Surgeon: Steva Ready, DO;  Location: [facility];  Service: Obstetrics;;   TONSILLECTOMY      Family History  Problem Relation Age of Onset   Diabetes Mother    Hypertension Mother    Hypertension Father    Diabetes Maternal Grandmother    Heart disease Maternal Grandmother    Diabetes Maternal Aunt    Diabetes Maternal Aunt     Social History   Socioeconomic History   Marital status: Single    Spouse name: Not on file   Number of children: Not on file   Years of education: Not on file   Highest education level: Not on file  Occupational History   Not on file  Tobacco Use   Smoking status: Never   Smokeless tobacco: Never  Vaping Use   Vaping Use: Never used  Substance and Sexual Activity   Alcohol use: Not Currently    Alcohol/week: 0.0 standard drinks of alcohol   Drug use: No   Sexual activity: Not Currently    Birth control/protection: None    Comment: 1st intercourse 32 yo-Fewer than 5 partners  Other Topics Concern   Not on file  Social History Narrative   Not on file   Social Determinants of Health   Financial Resource Strain: Low Risk  (01/11/2022)   Overall  Financial Resource Strain (CARDIA)    Difficulty of Paying Living Expenses: Not hard at all  Food Insecurity: No Food Insecurity (01/11/2022)   Hunger Vital Sign    Worried About Running Out of Food in the Last Year: Never true    Ran Out of Food in the Last Year: Never true  Transportation Needs: No Transportation Needs (01/11/2022)   PRAPARE - Administrator, Civil Service (Medical): No    Lack of Transportation (Non-Medical): No  Physical Activity: Insufficiently Active (01/11/2022)   Exercise Vital Sign    Days of Exercise per Week: 2 days    Minutes of Exercise per Session: 20 min  Stress: No Stress Concern Present (01/11/2022)   [employer] of Occupational Health - Occupational Stress Questionnaire    Feeling of Stress : Not at all  Social Connections: Moderately Integrated (01/11/2022)   Social Connection and Isolation Panel [NHANES]    Frequency of Communication with Friends and Family: More than three times a week    Frequency of Social Gatherings with Friends and Family: More than three times a week    Attends Religious Services: More than 4 times per year    Active Member of [employer] or Organizations: No    Attends Banker  Meetings: Never    Marital Status: Living with partner  Intimate Partner Violence: Not At Risk (01/11/2022)   Humiliation, Afraid, Rape, and Kick questionnaire    Fear of Current or Ex-Partner: No    Emotionally Abused: No    Physically Abused: No    Sexually Abused: No    Outpatient Medications Prior to Visit  Medication Sig Dispense Refill   ferrous sulfate 325 (65 FE) MG tablet Take by mouth 2 (two) times daily. 90 tablet 4   No facility-administered medications prior to visit.    No Known Allergies  Review of Systems CONSTITUTIONAL: Negative for chills, fatigue, fever,  E/N/T: see HPI CARDIOVASCULAR: Negative for chest pain,  RESPIRATORY: see HPI GASTROINTESTINAL: Negative for abdominal pain, acid reflux symptoms,  constipation, diarrhea, nausea and vomiting.       Objective:    PHYSICAL EXAM:   VS: BP 128/72 (BP Location: Left Arm, Patient Position: Sitting)   Pulse 94   Temp (!) 97.5 F (36.4 C) (Temporal)   Ht 5' 4.5" (1.638 m)   Wt 206 lb (93.4 kg)   SpO2 100%   BMI 34.81 kg/m   GEN: Well nourished, well developed, in no acute distress  HEENT: normal external ears and nose - normal external auditory canals and TMS -  - Lips, Teeth and Gums - normal  Oropharynx - mild erythema Cardiac: RRR; no murmurs, Respiratory:  faint scattered rhonchi - clear with cough - no wheezes  Office Visit on 10/27/2022  Component Date Value Ref Range Status   SARS Coronavirus 2 Ag 10/27/2022 Negative  Negative Final      Health Maintenance Due  Topic Date Due   COVID-19 Vaccine (1) Never done   DTaP/Tdap/Td (1 - Tdap) Never done   PAP SMEAR-Modifier  10/21/2022    There are no preventive care reminders to display for this patient.   Lab Results  Component Value Date   TSH 1.380 05/13/2022   Lab Results  Component Value Date   WBC 5.9 05/13/2022   HGB 9.7 (L) 05/13/2022   HCT 33.7 (L) 05/13/2022   MCV 69 (L) 05/13/2022   PLT 325 05/13/2022   Lab Results  Component Value Date   NA 137 05/13/2022   K 4.7 05/13/2022   CO2 23 05/13/2022   GLUCOSE 93 05/13/2022   BUN 12 05/13/2022   CREATININE 0.55 (L) 05/13/2022   BILITOT <0.2 05/13/2022   ALKPHOS 106 05/13/2022   AST 27 05/13/2022   ALT 20 05/13/2022   PROT 7.3 05/13/2022   ALBUMIN 4.1 05/13/2022   CALCIUM 9.5 05/13/2022   ANIONGAP 8 02/01/2022   EGFR 126 05/13/2022   Lab Results  Component Value Date   CHOL 167 05/13/2022   Lab Results  Component Value Date   HDL 76 05/13/2022   Lab Results  Component Value Date   LDLCALC 80 05/13/2022   Lab Results  Component Value Date   TRIG 57 05/13/2022   Lab Results  Component Value Date   CHOLHDL 2.2 05/13/2022   Lab Results  Component Value Date   HGBA1C 4.9  03/23/2022       Assessment & Plan:  Acute laryngopharyngitis -     POC COVID-19 BinaxNow -     Loratadine; Take 1 tablet (10 mg total) by mouth daily.  Dispense: 30 tablet; Refill: 3 -     Amoxicillin; Take 1 tablet (875 mg total) by mouth 2 (two) times daily for 10 days.  Dispense: 20 tablet; Refill:  0 -     Benzonatate; Take 1 capsule (100 mg total) by mouth 2 (two) times daily as needed for cough.  Dispense: 30 capsule; Refill: 0     Meds ordered this encounter  Medications   loratadine (CLARITIN) 10 MG tablet    Sig: Take 1 tablet (10 mg total) by mouth daily.    Dispense:  30 tablet    Refill:  3    Order Specific Question:   Supervising Provider    Answer:   Corey Harold   amoxicillin (AMOXIL) 875 MG tablet    Sig: Take 1 tablet (875 mg total) by mouth 2 (two) times daily for 10 days.    Dispense:  20 tablet    Refill:  0    Order Specific Question:   Supervising Provider    Answer:   Corey Harold   benzonatate (TESSALON) 100 MG capsule    Sig: Take 1 capsule (100 mg total) by mouth 2 (two) times daily as needed for cough.    Dispense:  30 capsule    Refill:  0    Order Specific Question:   Supervising Provider    AnswerCorey Harold    Orders Placed This Encounter  Procedures   POC COVID-19 BinaxNow     Follow-up: Return if symptoms worsen or fail to improve.  An After Visit Summary was printed and given to the patient.  Jettie Pagan [facility] [phone]

## 2022-11-29 ENCOUNTER — Encounter: Payer: Self-pay | Admitting: Physician Assistant

## 2022-11-29 NOTE — Telephone Encounter (Signed)
Called patient appointment has been made.

## 2022-12-07 ENCOUNTER — Ambulatory Visit (INDEPENDENT_AMBULATORY_CARE_PROVIDER_SITE_OTHER): Payer: Medicaid Other | Admitting: Physician Assistant

## 2022-12-07 VITALS — BP 90/60 | HR 88 | Temp 98.8°F | Resp 12 | Ht 64.5 in | Wt 209.0 lb

## 2022-12-07 DIAGNOSIS — N926 Irregular menstruation, unspecified: Secondary | ICD-10-CM

## 2022-12-07 DIAGNOSIS — Z0000 Encounter for general adult medical examination without abnormal findings: Secondary | ICD-10-CM | POA: Insufficient documentation

## 2022-12-07 DIAGNOSIS — R635 Abnormal weight gain: Secondary | ICD-10-CM | POA: Diagnosis not present

## 2022-12-07 NOTE — Assessment & Plan Note (Signed)
Checking TSH, and free T4 due to abnormal weight gain. Will add medications as needed

## 2022-12-07 NOTE — Patient Instructions (Addendum)
Contact Heather [email or web address]  [phone]

## 2022-12-07 NOTE — Assessment & Plan Note (Signed)
Will get labs today to evaluate overall health. Will look at possible weight loss medication if labs allow it.

## 2022-12-07 NOTE — Assessment & Plan Note (Signed)
Checking Iron, Ferritin, and TIBC to assess for anemia due to prolonged heavy periods.

## 2022-12-07 NOTE — Progress Notes (Signed)
Subjective:  Patient ID: Allison Jones, female    DOB: [birth year]  Age: 32 y.o. MRN: [id number]  Chief Complaint  Patient presents with   Weight Loss    HPI Patient has tried to lose weight. She decreased the portions of food since one month ago. She has been walking for 15 minutes 2 days a week. Patient states that she has tried exercising and diet modification working out every other day. Patient denies any issues with heat or cold intolerance. Patient states in the past 3 months she has noticed an increase in weight but is unsure of the exact amount. Patient states she has not had any history of anorexia, bulemia, or binge eating. Patient states that she drinks about 8 ounces of water a day. Patient denies any history of excessive soda drinking.      12/07/2022    2:51 PM 05/13/2022    7:43 AM 05/28/2021   10:13 AM 05/19/2020   11:16 AM  Depression screen PHQ 2/9  Decreased Interest 0 0 0 0  Down, Depressed, Hopeless 0 0 0 0  PHQ - 2 Score 0 0 0 0  Altered sleeping 0 0    Tired, decreased energy 0 0    Change in appetite 3 0    Feeling bad or failure about yourself  0 0    Trouble concentrating 0 0    Moving slowly or fidgety/restless 0 0    Suicidal thoughts 0 0    PHQ-9 Score 3 0    Difficult doing work/chores Not difficult at all Not difficult at all          12/07/2022    2:51 PM  Fall Risk   Falls in the past year? 0  Number falls in past yr: 0  Injury with Fall? 0  Risk for fall due to : No Fall Risks  Follow up Falls prevention discussed    Patient Care Team: Langley Gauss, PA as PCP - General (Physician Assistant)   Review of Systems  Constitutional:  Negative for chills, fatigue and fever.  HENT:  Negative for congestion, ear pain and sore throat.   Respiratory:  Negative for cough and shortness of breath.   Cardiovascular:  Negative for chest pain and palpitations.  Gastrointestinal:  Negative for abdominal pain, constipation, diarrhea, nausea and  vomiting.  Endocrine: Negative for polydipsia, polyphagia and polyuria.  Genitourinary:  Negative for difficulty urinating and dysuria.  Musculoskeletal:  Negative for arthralgias, back pain and myalgias.  Skin:  Negative for rash.  Neurological:  Negative for headaches.  Psychiatric/Behavioral:  Negative for dysphoric mood. The patient is not nervous/anxious.     No current outpatient medications on file prior to visit.   No current facility-administered medications on file prior to visit.   Past Medical History:  Diagnosis Date   H/O varicella    Increased BMI 11/20/2009   Wolff-Parkinson-White syndrome    Yeast infection    Past Surgical History:  Procedure Laterality Date   CARDIAC ELECTROPHYSIOLOGY STUDY AND ABLATION     CESAREAN SECTION  02/02/2022   Procedure: CESAREAN SECTION;  Surgeon: Steva Ready, DO;  Location: [facility];  Service: Obstetrics;;   TONSILLECTOMY      Family History  Problem Relation Age of Onset   Diabetes Mother    Hypertension Mother    Hypertension Father    Diabetes Maternal Grandmother    Heart disease Maternal Grandmother    Diabetes Maternal Aunt    Diabetes  Maternal Aunt    Social History   Socioeconomic History   Marital status: Single    Spouse name: Not on file   Number of children: Not on file   Years of education: Not on file   Highest education level: 12th grade  Occupational History   Not on file  Tobacco Use   Smoking status: Never   Smokeless tobacco: Never  Vaping Use   Vaping Use: Never used  Substance and Sexual Activity   Alcohol use: Not Currently    Alcohol/week: 0.0 standard drinks of alcohol   Drug use: No   Sexual activity: Not Currently    Birth control/protection: None    Comment: 1st intercourse 32 yo-Fewer than 5 partners  Other Topics Concern   Not on file  Social History Narrative   Not on file   Social Determinants of Health   Financial Resource Strain: Low Risk  (12/07/2022)   Overall  Financial Resource Strain (CARDIA)    Difficulty of Paying Living Expenses: Not hard at all  Food Insecurity: No Food Insecurity (12/07/2022)   Hunger Vital Sign    Worried About Running Out of Food in the Last Year: Never true    Ran Out of Food in the Last Year: Never true  Transportation Needs: No Transportation Needs (12/07/2022)   PRAPARE - Administrator, Civil Service (Medical): No    Lack of Transportation (Non-Medical): No  Physical Activity: Inactive (12/07/2022)   Exercise Vital Sign    Days of Exercise per Week: 0 days    Minutes of Exercise per Session: 20 min  Stress: No Stress Concern Present (12/07/2022)   [employer] of Occupational Health - Occupational Stress Questionnaire    Feeling of Stress : Not at all  Social Connections: Socially Isolated (12/07/2022)   Social Connection and Isolation Panel [NHANES]    Frequency of Communication with Friends and Family: Once a week    Frequency of Social Gatherings with Friends and Family: Once a week    Attends Religious Services: 1 to 4 times per year    Active Member of [employer] or Organizations: No    Attends Engineer, structural: Never    Marital Status: Never married    Objective:  BP 90/60   Pulse 88   Temp 98.8 F (37.1 C)   Resp 12   Ht 5' 4.5" (1.638 m)   Wt 209 lb (94.8 kg)   LMP 12/05/2022 (Exact Date)   SpO2 99%   Breastfeeding No   BMI 35.32 kg/m      12/07/2022    1:58 PM 10/27/2022    8:38 AM 05/13/2022    7:41 AM  BP/Weight  Systolic BP 90 128 136  Diastolic BP 60 72 82  Wt. (Lbs) 209 206 185  BMI 35.32 kg/m2 34.81 kg/m2 31.26 kg/m2    Physical Exam Constitutional:      Appearance: Normal appearance. She is obese.  Eyes:     Conjunctiva/sclera: Conjunctivae normal.  Neck:     Vascular: No carotid bruit.  Cardiovascular:     Rate and Rhythm: Normal rate and regular rhythm.     Heart sounds: Normal heart sounds.  Pulmonary:     Effort: Pulmonary effort is normal.      Breath sounds: Normal breath sounds.  Abdominal:     General: Bowel sounds are normal.     Palpations: Abdomen is soft.     Tenderness: There is no abdominal tenderness.  Neurological:  Mental Status: She is alert and oriented to person, place, and time.  Psychiatric:        Behavior: Behavior normal.     Diabetic Foot Exam - Simple   No data filed      Lab Results  Component Value Date   WBC 5.9 12/09/2022   HGB 8.3 (L) 12/09/2022   HCT 28.8 (L) 12/09/2022   PLT 441 12/09/2022   GLUCOSE 96 12/09/2022   CHOL 139 12/09/2022   TRIG 53 12/09/2022   HDL 65 12/09/2022   LDLCALC 62 12/09/2022   ALT 12 12/09/2022   AST 20 12/09/2022   NA 135 12/09/2022   K 4.8 12/09/2022   CL 102 12/09/2022   CREATININE 0.65 12/09/2022   BUN 11 12/09/2022   CO2 19 (L) 12/09/2022   TSH 2.000 12/09/2022   HGBA1C 4.9 03/23/2022      Assessment & Plan:    Routine medical exam Assessment & Plan: Will get labs today to evaluate overall health. Will look at possible weight loss medication if labs allow it.   Orders: -     CBC with Differential/Platelet; Future -     Comprehensive metabolic panel; Future -     Lipid panel; Future  Abnormal menstrual periods Assessment & Plan: Checking Iron, Ferritin, and TIBC to assess for anemia due to prolonged heavy periods.   Orders: -     Iron, TIBC and Ferritin Panel; Future  Weight gain, abnormal Assessment & Plan: Checking TSH, and free T4 due to abnormal weight gain. Will add medications as needed  Orders: -     T4, free; Future -     TSH; Future     No orders of the defined types were placed in this encounter.   Orders Placed This Encounter  Procedures   CBC with Differential/Platelet   Comprehensive metabolic panel   Iron, TIBC and Ferritin Panel   Lipid panel   T4, free   TSH     Follow-up: Return in about 4 weeks (around 01/04/2023) for [facility].   I,Marla I Leal-Borjas,acting as a scribe for [employer],  PA.,have documented all relevant documentation on the behalf of Langley Gauss, PA,as directed by  Langley Gauss, PA while in the presence of Langley Gauss, [place].   An After Visit Summary was printed and given to the patient.  Langley Gauss, [place] [facility] [phone]

## 2022-12-09 ENCOUNTER — Other Ambulatory Visit: Payer: Medicaid Other

## 2022-12-09 DIAGNOSIS — Z0000 Encounter for general adult medical examination without abnormal findings: Secondary | ICD-10-CM

## 2022-12-09 DIAGNOSIS — R635 Abnormal weight gain: Secondary | ICD-10-CM

## 2022-12-09 DIAGNOSIS — N926 Irregular menstruation, unspecified: Secondary | ICD-10-CM

## 2022-12-10 ENCOUNTER — Other Ambulatory Visit: Payer: Self-pay | Admitting: Physician Assistant

## 2022-12-10 ENCOUNTER — Other Ambulatory Visit: Payer: Self-pay | Admitting: Family Medicine

## 2022-12-10 ENCOUNTER — Telehealth: Payer: Self-pay

## 2022-12-10 DIAGNOSIS — R635 Abnormal weight gain: Secondary | ICD-10-CM

## 2022-12-10 DIAGNOSIS — Z862 Personal history of diseases of the blood and blood-forming organs and certain disorders involving the immune mechanism: Secondary | ICD-10-CM

## 2022-12-10 LAB — LIPID PANEL
Chol/HDL Ratio: 2.1 ratio (ref 0.0–4.4)
Cholesterol, Total: 139 mg/dL (ref 100–199)
HDL: 65 mg/dL (ref >39)
LDL Chol Calc (NIH): 62 mg/dL (ref 0–99)
Triglycerides: 53 mg/dL (ref 0–149)
VLDL Cholesterol Cal: 12 mg/dL (ref 5–40)

## 2022-12-10 LAB — T4, FREE: Free T4: 1.16 ng/dL (ref 0.82–1.77)

## 2022-12-10 LAB — COMPREHENSIVE METABOLIC PANEL
ALT: 12 IU/L (ref 0–32)
AST: 20 IU/L (ref 0–40)
Albumin/Globulin Ratio: 1.4 (ref 1.2–2.2)
Albumin: 4.1 g/dL (ref 3.9–4.9)
Alkaline Phosphatase: 88 IU/L (ref 44–121)
BUN/Creatinine Ratio: 17 (ref 9–23)
BUN: 11 mg/dL (ref 6–20)
Bilirubin Total: <0.2 mg/dL (ref 0.0–1.2)
CO2: 19 mmol/L — ABNORMAL LOW (ref 20–29)
Calcium: 9.1 mg/dL (ref 8.7–10.2)
Chloride: 102 mmol/L (ref 96–106)
Creatinine, Ser: 0.65 mg/dL (ref 0.57–1.00)
Globulin, Total: 3 g/dL (ref 1.5–4.5)
Glucose: 96 mg/dL (ref 70–99)
Potassium: 4.8 mmol/L (ref 3.5–5.2)
Sodium: 135 mmol/L (ref 134–144)
Total Protein: 7.1 g/dL (ref 6.0–8.5)
eGFR: 121 mL/min/{1.73_m2} (ref >59)

## 2022-12-10 LAB — CBC WITH DIFFERENTIAL/PLATELET
Basophils Absolute: 0.1 10*3/uL (ref 0.0–0.2)
Basos: 1 %
EOS (ABSOLUTE): 0.1 10*3/uL (ref 0.0–0.4)
Eos: 1 %
Hematocrit: 28.8 % — ABNORMAL LOW (ref 34.0–46.6)
Hemoglobin: 8.3 g/dL — ABNORMAL LOW (ref 11.1–15.9)
Immature Grans (Abs): 0 10*3/uL (ref 0.0–0.1)
Immature Granulocytes: 0 %
Lymphocytes Absolute: 2.5 10*3/uL (ref 0.7–3.1)
Lymphs: 43 %
MCH: 17.8 pg — ABNORMAL LOW (ref 26.6–33.0)
MCHC: 28.8 g/dL — ABNORMAL LOW (ref 31.5–35.7)
MCV: 62 fL — ABNORMAL LOW (ref 79–97)
Monocytes Absolute: 0.6 10*3/uL (ref 0.1–0.9)
Monocytes: 10 %
Neutrophils Absolute: 2.6 10*3/uL (ref 1.4–7.0)
Neutrophils: 45 %
Platelets: 441 10*3/uL (ref 150–450)
RBC: 4.65 x10E6/uL (ref 3.77–5.28)
RDW: 20.2 % — ABNORMAL HIGH (ref 11.7–15.4)
WBC: 5.9 10*3/uL (ref 3.4–10.8)

## 2022-12-10 LAB — IRON,TIBC AND FERRITIN PANEL
Ferritin: 4 ng/mL — ABNORMAL LOW (ref 15–150)
Iron Saturation: 4 % — CL (ref 15–55)
Iron: 16 ug/dL — ABNORMAL LOW (ref 27–159)
Total Iron Binding Capacity: 425 ug/dL (ref 250–450)
UIBC: 409 ug/dL (ref 131–425)

## 2022-12-10 LAB — TSH: TSH: 2 u[IU]/mL (ref 0.450–4.500)

## 2022-12-10 LAB — CARDIOVASCULAR RISK ASSESSMENT

## 2022-12-10 MED ORDER — PHENTERMINE HCL 15 MG PO CAPS
15.0000 mg | ORAL_CAPSULE | ORAL | 0 refills | Status: DC
Start: 2022-12-10 — End: 2022-12-10

## 2022-12-10 MED ORDER — PHENTERMINE HCL 15 MG PO CAPS: 15.0000 mg | ORAL_CAPSULE | ORAL | 0 refills | Status: DC

## 2022-12-10 NOTE — Telephone Encounter (Signed)
Left detailed message informing patient of lab results and told the patient to call us back on Monday to let us know that she did receive our message to call us back and we can make the referral to hematology.

## 2022-12-10 NOTE — Telephone Encounter (Signed)
Patient called and stated that [facility] pharmacy does not carry the Phenterimine and patient is wanting to know if we can send the prescription to [facility] in [place].

## 2023-01-04 ENCOUNTER — Ambulatory Visit: Payer: Medicaid Other | Admitting: Physician Assistant

## 2023-01-05 ENCOUNTER — Other Ambulatory Visit: Payer: Self-pay | Admitting: Oncology

## 2023-01-05 DIAGNOSIS — D508 Other iron deficiency anemias: Secondary | ICD-10-CM

## 2023-01-06 ENCOUNTER — Inpatient Hospital Stay: Payer: Medicaid Other | Admitting: Oncology

## 2023-01-06 ENCOUNTER — Inpatient Hospital Stay: Payer: Medicaid Other

## 2023-01-07 ENCOUNTER — Ambulatory Visit (INDEPENDENT_AMBULATORY_CARE_PROVIDER_SITE_OTHER): Payer: Medicaid Other | Admitting: Physician Assistant

## 2023-01-07 ENCOUNTER — Encounter: Payer: Self-pay | Admitting: Physician Assistant

## 2023-01-07 VITALS — BP 124/68 | HR 84 | Temp 97.5°F | Ht 64.5 in | Wt 205.0 lb

## 2023-01-07 DIAGNOSIS — R635 Abnormal weight gain: Secondary | ICD-10-CM | POA: Diagnosis not present

## 2023-01-07 MED ORDER — PHENTERMINE HCL 15 MG PO CAPS: 15.0000 mg | ORAL_CAPSULE | ORAL | 0 refills | Status: DC

## 2023-01-07 NOTE — Assessment & Plan Note (Addendum)
Continue taking the phentermine as directed Discussed side effects of nausea and rebound hunger on days she wasn't taking them Will try to eat small snacks more throughout the day and be consistent on taking the medicine every day. Will try one more month of the medicine to see if it will work for her.

## 2023-01-07 NOTE — Progress Notes (Signed)
Subjective:  Patient ID: Allison Jones, female    DOB: 07-05-1991  Age: 32 y.o. MRN: [id number]  Chief Complaint  Patient presents with   Medical Management of Chronic Issues    HPI   Patient presents today for 4 week follow up. During last visit strated on phentermine. Start weight was 209, current weight 205. Total weight loss 4 lbs. Exercies twice a week for approx 1 hour. Drinking water. Trying to eat healthy, typically eats twice a day. Stated she did have the experience of having a rebound increased hunger on days she forgot to take the phentermine. Discussed with her that can happen and to try and be consistent with taking it. She also reported some nausea on days she was taking the medication. She would normally eat 1-2 meals a day on the days she would experience it. Discussed having her do small snacks more throughout the day then instead of forcing herself to eat big meals if she isn't hungry. These small snacks can also help with the nausea. The dietician was unable to help her with meal planning. Discussed with her working on a diet similar to the DASH diet as she has a history of high BP while pregnant and has a family history of high BP. Patient said she would look into it.     12/07/2022    2:51 PM 05/13/2022    7:43 AM 05/28/2021   10:13 AM 05/19/2020   11:16 AM  Depression screen PHQ 2/9  Decreased Interest 0 0 0 0  Down, Depressed, Hopeless 0 0 0 0  PHQ - 2 Score 0 0 0 0  Altered sleeping 0 0    Tired, decreased energy 0 0    Change in appetite 3 0    Feeling bad or failure about yourself  0 0    Trouble concentrating 0 0    Moving slowly or fidgety/restless 0 0    Suicidal thoughts 0 0    PHQ-9 Score 3 0    Difficult doing work/chores Not difficult at all Not difficult at all          12/07/2022    2:51 PM  Fall Risk   Falls in the past year? 0  Number falls in past yr: 0  Injury with Fall? 0  Risk for fall due to : No Fall Risks  Follow up Falls  prevention discussed    Patient Care Team: Langley Gauss, PA as PCP - General (Physician Assistant)   Review of Systems  Constitutional:  Negative for chills, fatigue and fever.  HENT:  Negative for congestion, ear pain, rhinorrhea and sore throat.   Respiratory:  Negative for cough and shortness of breath.   Cardiovascular:  Negative for chest pain.  Gastrointestinal:  Negative for abdominal pain, constipation, diarrhea, nausea and vomiting.  Genitourinary:  Negative for dysuria and urgency.  Musculoskeletal:  Negative for back pain and myalgias.  Neurological:  Negative for dizziness, weakness, light-headedness and headaches.  Psychiatric/Behavioral:  Negative for dysphoric mood. The patient is not nervous/anxious.     No current outpatient medications on file prior to visit.   No current facility-administered medications on file prior to visit.   Past Medical History:  Diagnosis Date   H/O varicella    Increased BMI 11/20/2009   Wolff-Parkinson-White syndrome    Yeast infection    Past Surgical History:  Procedure Laterality Date   CARDIAC ELECTROPHYSIOLOGY STUDY AND ABLATION     CESAREAN SECTION  02/02/2022  Procedure: CESAREAN SECTION;  Surgeon: Steva Ready, DO;  Location: [facility];  Service: Obstetrics;;   TONSILLECTOMY      Family History  Problem Relation Age of Onset   Diabetes Mother    Hypertension Mother    Hypertension Father    Diabetes Maternal Grandmother    Heart disease Maternal Grandmother    Diabetes Maternal Aunt    Diabetes Maternal Aunt    Social History   Socioeconomic History   Marital status: Single    Spouse name: Not on file   Number of children: Not on file   Years of education: Not on file   Highest education level: 12th grade  Occupational History   Not on file  Tobacco Use   Smoking status: Never   Smokeless tobacco: Never  Vaping Use   Vaping Use: Never used  Substance and Sexual Activity   Alcohol use: Not Currently     Alcohol/week: 0.0 standard drinks of alcohol   Drug use: No   Sexual activity: Not Currently    Birth control/protection: None    Comment: 1st intercourse 32 yo-Fewer than 5 partners  Other Topics Concern   Not on file  Social History Narrative   Not on file   Social Determinants of Health   Financial Resource Strain: Low Risk  (12/07/2022)   Overall Financial Resource Strain (CARDIA)    Difficulty of Paying Living Expenses: Not hard at all  Food Insecurity: No Food Insecurity (12/07/2022)   Hunger Vital Sign    Worried About Running Out of Food in the Last Year: Never true    Ran Out of Food in the Last Year: Never true  Transportation Needs: No Transportation Needs (12/07/2022)   PRAPARE - Administrator, Civil Service (Medical): No    Lack of Transportation (Non-Medical): No  Physical Activity: Inactive (12/07/2022)   Exercise Vital Sign    Days of Exercise per Week: 0 days    Minutes of Exercise per Session: 20 min  Stress: No Stress Concern Present (12/07/2022)   [employer] of Occupational Health - Occupational Stress Questionnaire    Feeling of Stress : Not at all  Social Connections: Socially Isolated (12/07/2022)   Social Connection and Isolation Panel [NHANES]    Frequency of Communication with Friends and Family: Once a week    Frequency of Social Gatherings with Friends and Family: Once a week    Attends Religious Services: 1 to 4 times per year    Active Member of [employer] or Organizations: No    Attends Engineer, structural: Never    Marital Status: Never married    Objective:  BP 124/68   Pulse 84   Temp (!) 97.5 F (36.4 C)   Ht 5' 4.5" (1.638 m)   Wt 205 lb (93 kg)   SpO2 100%   BMI 34.64 kg/m      01/07/2023   10:54 AM 12/07/2022    1:58 PM 10/27/2022    8:38 AM  BP/Weight  Systolic BP 124 90 128  Diastolic BP 68 60 72  Wt. (Lbs) 205 209 206  BMI 34.64 kg/m2 35.32 kg/m2 34.81 kg/m2    Physical Exam Vitals reviewed.   Constitutional:      Appearance: Normal appearance. She is normal weight.  Neck:     Vascular: No carotid bruit.  Cardiovascular:     Rate and Rhythm: Normal rate and regular rhythm.     Heart sounds: Normal heart sounds.  Pulmonary:     Effort: Pulmonary effort is normal. No respiratory distress.     Breath sounds: Normal breath sounds.  Abdominal:     General: Abdomen is flat. Bowel sounds are normal.     Palpations: Abdomen is soft.     Tenderness: There is no abdominal tenderness.  Neurological:     Mental Status: She is alert and oriented to person, place, and time.  Psychiatric:        Mood and Affect: Mood normal.        Behavior: Behavior normal.     Diabetic Foot Exam - Simple   No data filed      Lab Results  Component Value Date   WBC 5.9 12/09/2022   HGB 8.3 (L) 12/09/2022   HCT 28.8 (L) 12/09/2022   PLT 441 12/09/2022   GLUCOSE 96 12/09/2022   CHOL 139 12/09/2022   TRIG 53 12/09/2022   HDL 65 12/09/2022   LDLCALC 62 12/09/2022   ALT 12 12/09/2022   AST 20 12/09/2022   NA 135 12/09/2022   K 4.8 12/09/2022   CL 102 12/09/2022   CREATININE 0.65 12/09/2022   BUN 11 12/09/2022   CO2 19 (L) 12/09/2022   TSH 2.000 12/09/2022   HGBA1C 4.9 03/23/2022      Assessment & Plan:    Weight gain, abnormal Assessment & Plan: Continue taking the phentermine as directed Discussed side effects of nausea and rebound hunger on days she wasn't taking them Will try to eat small snacks more throughout the day and be consistent on taking the medicine every day. Will try one more month of the medicine to see if it will work for her.   Orders: -     Phentermine HCl; Take 1 capsule (15 mg total) by mouth every morning.  Dispense: 30 capsule; Refill: 0     Meds ordered this encounter  Medications   phentermine 15 MG capsule    Sig: Take 1 capsule (15 mg total) by mouth every morning.    Dispense:  30 capsule    Refill:  0    No orders of the defined types  were placed in this encounter.    Follow-up: Return in about 4 weeks (around 02/04/2023) for [place].   I,Katherina A Bramblett,acting as a scribe for [employer], PA.,have documented all relevant documentation on the behalf of Langley Gauss, PA,as directed by  Langley Gauss, PA while in the presence of Langley Gauss, [place].   An After Visit Summary was printed and given to the patient.  Langley Gauss, [place] [facility] [phone]

## 2023-01-19 ENCOUNTER — Other Ambulatory Visit: Payer: Self-pay | Admitting: Oncology

## 2023-01-19 DIAGNOSIS — D508 Other iron deficiency anemias: Secondary | ICD-10-CM

## 2023-01-19 NOTE — Progress Notes (Unsigned)
[facility]  [address] [place],  [place]  [address] [phone]  Clinic Day:  01/20/2023  Referring physician: Langley Gauss, PA   HISTORY OF PRESENT ILLNESS:  The patient is a 32 y.o. female  who I was asked to consult upon for iron deficiency anemia.  Recent labs showed a low hemoglobin of 8.3, with a low MCV of 62.  Iron studies done recently showed a low ferritin of 4, a low serum iron of 16, a TIBC of 425, and a low iron saturation of 4%.  The patient claims her menstrual cycles are not particularly heavy.  Her periods last only 5 days with occasional clots being seen.  However, the patient has had 2 pregnancies within the past 3 years.  She recalls being told that she was iron deficient with her first pregnancy, for which she took iron pills intermittently.  She could not continue to take them due to the iron pills causing nausea.  The patient denies having other overt forms of blood loss.  She does have significant ice cravings.  PAST MEDICAL HISTORY:   Past Medical History:  Diagnosis Date   Anemia    H/O varicella    Increased BMI 11/20/2009   Wolff-Parkinson-White syndrome    Yeast infection     PAST SURGICAL HISTORY:   Past Surgical History:  Procedure Laterality Date   CARDIAC ELECTROPHYSIOLOGY STUDY AND ABLATION     CESAREAN SECTION  02/02/2022   Procedure: CESAREAN SECTION;  Surgeon: Steva Ready, DO;  Location: [facility];  Service: Obstetrics;;   MYRINGOTOMY Bilateral    TONSILLECTOMY      CURRENT MEDICATIONS:   Current Outpatient Medications  Medication Sig Dispense Refill   phentermine 15 MG capsule Take 1 capsule (15 mg total) by mouth every morning. 30 capsule 0   No current facility-administered medications for this visit.    ALLERGIES:  No Known Allergies  FAMILY HISTORY:   Family History  Problem Relation Age of Onset   Diabetes Mother    Hypertension Mother    Hypertension Father    Diabetes Maternal  Aunt    Breast cancer Maternal Aunt    Diabetes Maternal Aunt    Diabetes Maternal Grandmother    Heart disease Maternal Grandmother     SOCIAL HISTORY:  The patient was born and raised in [place].  She currently lives in town.  She is single, with 2 grandchildren.  She is unemployed; she previously worked at a [employer].  There is no history of alcoholism or tobacco abuse.    REVIEW OF SYSTEMS:  Review of Systems  Constitutional:  Negative for fatigue and fever.  HENT:   Positive for hearing loss. Negative for sore throat.   Eyes:  Negative for eye problems.  Respiratory:  Negative for chest tightness, cough and hemoptysis.   Cardiovascular:  Negative for chest pain and palpitations.  Gastrointestinal:  Negative for abdominal distention, abdominal pain, blood in stool, constipation, diarrhea, nausea and vomiting.  Endocrine: Negative for hot flashes.  Genitourinary:  Negative for difficulty urinating, dysuria, frequency, hematuria and nocturia.   Musculoskeletal:  Negative for arthralgias, back pain, gait problem and myalgias.  Skin: Negative.  Negative for itching and rash.  Neurological: Negative.  Negative for dizziness, extremity weakness, gait problem, headaches, light-headedness and numbness.  Hematological: Negative.   Psychiatric/Behavioral: Negative.  Negative for depression and suicidal ideas. The patient is not nervous/anxious.      PHYSICAL EXAM:  Blood pressure (!) 165/104, pulse 66, temperature 98.3 F (36.8 C), resp. rate 16, height 5\' 10"  (1.778 m), weight 201 lb 14.4 oz (91.6 kg), SpO2 96%, not currently breastfeeding. Wt Readings from Last 3 Encounters:  01/20/23 201 lb 14.4 oz (91.6 kg)  01/07/23 205 lb (93 kg)  12/07/22 209 lb (94.8 kg)   Body mass index is 28.97 kg/m. Performance status (ECOG): 0 - Asymptomatic Physical Exam Constitutional:      Appearance: Normal appearance. She is not ill-appearing.  HENT:     Mouth/Throat:      Mouth: Mucous membranes are moist.     Pharynx: Oropharynx is clear. No oropharyngeal exudate or posterior oropharyngeal erythema.  Cardiovascular:     Rate and Rhythm: Normal rate and regular rhythm.     Heart sounds: No murmur heard.    No friction rub. No gallop.  Pulmonary:     Effort: Pulmonary effort is normal. No respiratory distress.     Breath sounds: Normal breath sounds. No wheezing, rhonchi or rales.  Abdominal:     General: Bowel sounds are normal. There is no distension.     Palpations: Abdomen is soft. There is no mass.     Tenderness: There is no abdominal tenderness.  Musculoskeletal:        General: No swelling.     Right lower leg: No edema.     Left lower leg: No edema.  Lymphadenopathy:     Cervical: No cervical adenopathy.     Upper Body:     Right upper body: No supraclavicular or axillary adenopathy.     Left upper body: No supraclavicular or axillary adenopathy.     Lower Body: No right inguinal adenopathy. No left inguinal adenopathy.  Skin:    General: Skin is warm.     Coloration: Skin is not jaundiced.     Findings: No lesion or rash.  Neurological:     General: No focal deficit present.     Mental Status: She is alert and oriented to person, place, and time. Mental status is at baseline.  Psychiatric:        Mood and Affect: Mood normal.        Behavior: Behavior normal.        Thought Content: Thought content normal.    LABS:       Latest Ref Rng & Units 01/20/2023   12:00 AM 12/09/2022    9:22 AM 05/13/2022    8:15 AM  CBC  WBC  5.1     5.9  5.9   Hemoglobin 12.0 - 16.0 8.4     8.3  9.7   Hematocrit 36 - 46 28     28.8  33.7   Platelets 150 - 400 K/uL 295     441  325      This result is from an external source.      Latest Ref Rng & Units 12/09/2022    9:22 AM 05/13/2022    8:15 AM 03/23/2022    2:01 PM  CMP  Glucose 70 - 99 mg/dL 96  93  84   BUN 6 - 20 mg/dL 11  12  8    Creatinine 0.57 - 1.00 mg/dL 9.14  7.82  9.56   Sodium 134  - 144 mmol/L 135  137  136   Potassium 3.5 - 5.2 mmol/L 4.8  4.7  4.7   Chloride 96 - 106 mmol/L 102  102  101   CO2 20 -  29 mmol/L 19  23  19    Calcium 8.7 - 10.2 mg/dL 9.1  9.5  9.2   Total Protein 6.0 - 8.5 g/dL 7.1  7.3  7.4   Total Bilirubin 0.0 - 1.2 mg/dL <1.3  <0.8  0.2   Alkaline Phos 44 - 121 IU/L 88  106  121   AST 0 - 40 IU/L 20  27  38   ALT 0 - 32 IU/L 12  20  36    ASSESSMENT & PLAN:  A 32 y.o. female who I was asked to consult upon for iron deficiency anemia.  I will arrange for her to receive IV iron over these next few weeks to rapidly replenish her iron stores and normalize his hemoglobin.  I will see him back in 3 months to reassess her iron and hemoglobin levels to see how well she responded to her upcoming IV iron.  The patient understands all the plans discussed today and is in agreement with them.  I do appreciate Langley Gauss, [place] for his new consult.   Drayden Lukas Kirby Funk, MD

## 2023-01-20 ENCOUNTER — Other Ambulatory Visit: Payer: Self-pay | Admitting: Oncology

## 2023-01-20 ENCOUNTER — Encounter: Payer: Self-pay | Admitting: Oncology

## 2023-01-20 ENCOUNTER — Inpatient Hospital Stay (INDEPENDENT_AMBULATORY_CARE_PROVIDER_SITE_OTHER): Payer: Medicaid Other | Admitting: Oncology

## 2023-01-20 ENCOUNTER — Inpatient Hospital Stay: Payer: Medicaid Other | Attending: Oncology

## 2023-01-20 VITALS — BP 165/104 | HR 66 | Temp 98.3°F | Resp 16 | Ht 70.0 in | Wt 201.9 lb

## 2023-01-20 DIAGNOSIS — D509 Iron deficiency anemia, unspecified: Secondary | ICD-10-CM

## 2023-01-20 DIAGNOSIS — D508 Other iron deficiency anemias: Secondary | ICD-10-CM

## 2023-01-20 DIAGNOSIS — Z803 Family history of malignant neoplasm of breast: Secondary | ICD-10-CM | POA: Diagnosis not present

## 2023-01-20 LAB — CBC AND DIFFERENTIAL
HCT: 28 — AB (ref 36–46)
Hemoglobin: 8.4 — AB (ref 12.0–16.0)
Neutrophils Absolute: 2.09
Platelets: 295 10*3/uL (ref 150–400)
WBC: 5.1

## 2023-01-20 LAB — CBC: RBC: 4.73 (ref 3.87–5.11)

## 2023-01-21 ENCOUNTER — Other Ambulatory Visit: Payer: Self-pay

## 2023-01-21 DIAGNOSIS — R635 Abnormal weight gain: Secondary | ICD-10-CM

## 2023-01-26 ENCOUNTER — Inpatient Hospital Stay: Payer: Medicaid Other

## 2023-01-26 VITALS — BP 135/84 | HR 73 | Temp 97.8°F | Wt 208.1 lb

## 2023-01-26 DIAGNOSIS — D509 Iron deficiency anemia, unspecified: Secondary | ICD-10-CM | POA: Diagnosis present

## 2023-01-26 DIAGNOSIS — D508 Other iron deficiency anemias: Secondary | ICD-10-CM

## 2023-01-26 MED ORDER — SODIUM CHLORIDE 0.9 % IV SOLN
510.0000 mg | Freq: Once | INTRAVENOUS | Status: AC
  Administered 2023-01-26: 510 mg via INTRAVENOUS
  Filled 2023-01-26: qty 510

## 2023-01-26 MED ORDER — ACETAMINOPHEN 325 MG PO TABS
650.0000 mg | ORAL_TABLET | Freq: Once | ORAL | Status: AC
  Administered 2023-01-26: 650 mg via ORAL
  Filled 2023-01-26: qty 2

## 2023-01-26 MED ORDER — SODIUM CHLORIDE 0.9 % IV SOLN: Freq: Once | INTRAVENOUS | Status: AC

## 2023-01-26 MED ORDER — FAMOTIDINE IN NACL 20-0.9 MG/50ML-% IV SOLN
20.0000 mg | Freq: Once | INTRAVENOUS | Status: AC
  Administered 2023-01-26: 20 mg via INTRAVENOUS
  Filled 2023-01-26: qty 50

## 2023-01-26 NOTE — Patient Instructions (Signed)
Ferumoxytol Injection What is this medication? FERUMOXYTOL (FER ue MOX i tol) treats low levels of iron in your body (iron deficiency anemia). Iron is a mineral that plays an important role in making red blood cells, which carry oxygen from your lungs to the rest of your body. This medicine may be used for other purposes; ask your health care provider or pharmacist if you have questions. COMMON BRAND NAME(S): Feraheme What should I tell my care team before I take this medication? They need to know if you have any of these conditions: Anemia not caused by low iron levels High levels of iron in the blood Magnetic resonance imaging (MRI) test scheduled An unusual or allergic reaction to iron, other medications, foods, dyes, or preservatives Pregnant or trying to get pregnant Breastfeeding How should I use this medication? This medication is injected into a vein. It is given by your care team in a hospital or clinic setting. Talk to your care team the use of this medication in children. Special care may be needed. Overdosage: If you think you have taken too much of this medicine contact a poison control center or emergency room at once. NOTE: This medicine is only for you. Do not share this medicine with others. What if I miss a dose? It is important not to miss your dose. Call your care team if you are unable to keep an appointment. What may interact with this medication? Other iron products This list may not describe all possible interactions. Give your health care provider a list of all the medicines, herbs, non-prescription drugs, or dietary supplements you use. Also tell them if you smoke, drink alcohol, or use illegal drugs. Some items may interact with your medicine. What should I watch for while using this medication? Visit your care team regularly. Tell your care team if your symptoms do not start to get better or if they get worse. You may need blood work done while you are taking this  medication. You may need to follow a special diet. Talk to your care team. Foods that contain iron include: whole grains/cereals, dried fruits, beans, or peas, leafy green vegetables, and organ meats (liver, kidney). What side effects may I notice from receiving this medication? Side effects that you should report to your care team as soon as possible: Allergic reactions--skin rash, itching, hives, swelling of the face, lips, tongue, or throat Low blood pressure--dizziness, feeling faint or lightheaded, blurry vision Shortness of breath Side effects that usually do not require medical attention (report to your care team if they continue or are bothersome): Flushing Headache Joint pain Muscle pain Nausea Pain, redness, or irritation at injection site This list may not describe all possible side effects. Call your doctor for medical advice about side effects. You may report side effects to FDA at 1-800-FDA-1088. Where should I keep my medication? This medication is given in a hospital or clinic and will not be stored at home. NOTE: This sheet is a summary. It may not cover all possible information. If you have questions about this medicine, talk to your doctor, pharmacist, or health care provider.  2024 Elsevier/Gold Standard (2022-01-04 00:00:00)  

## 2023-02-01 MED FILL — Ferumoxytol Inj 510 MG/17ML (30 MG/ML) (Elemental Fe): INTRAVENOUS | Qty: 17 | Status: AC

## 2023-02-02 ENCOUNTER — Inpatient Hospital Stay: Payer: Medicaid Other

## 2023-02-02 VITALS — BP 117/66 | HR 76 | Temp 98.4°F | Resp 14 | Ht 70.0 in | Wt 204.1 lb

## 2023-02-02 DIAGNOSIS — D508 Other iron deficiency anemias: Secondary | ICD-10-CM

## 2023-02-02 DIAGNOSIS — D509 Iron deficiency anemia, unspecified: Secondary | ICD-10-CM | POA: Diagnosis not present

## 2023-02-02 MED ORDER — SODIUM CHLORIDE 0.9 % IV SOLN: Freq: Once | INTRAVENOUS | Status: AC

## 2023-02-02 MED ORDER — ACETAMINOPHEN 325 MG PO TABS
650.0000 mg | ORAL_TABLET | Freq: Once | ORAL | Status: AC
  Administered 2023-02-02: 650 mg via ORAL
  Filled 2023-02-02: qty 2

## 2023-02-02 MED ORDER — FAMOTIDINE IN NACL 20-0.9 MG/50ML-% IV SOLN
20.0000 mg | Freq: Once | INTRAVENOUS | Status: AC
  Administered 2023-02-02: 20 mg via INTRAVENOUS
  Filled 2023-02-02: qty 50

## 2023-02-02 MED ORDER — SODIUM CHLORIDE 0.9 % IV SOLN
510.0000 mg | Freq: Once | INTRAVENOUS | Status: AC
  Administered 2023-02-02: 510 mg via INTRAVENOUS
  Filled 2023-02-02: qty 510

## 2023-02-02 NOTE — Patient Instructions (Signed)
Ferumoxytol Injection What is this medication? FERUMOXYTOL (FER ue MOX i tol) treats low levels of iron in your body (iron deficiency anemia). Iron is a mineral that plays an important role in making red blood cells, which carry oxygen from your lungs to the rest of your body. This medicine may be used for other purposes; ask your health care provider or pharmacist if you have questions. COMMON BRAND NAME(S): Feraheme What should I tell my care team before I take this medication? They need to know if you have any of these conditions: Anemia not caused by low iron levels High levels of iron in the blood Magnetic resonance imaging (MRI) test scheduled An unusual or allergic reaction to iron, other medications, foods, dyes, or preservatives Pregnant or trying to get pregnant Breastfeeding How should I use this medication? This medication is injected into a vein. It is given by your care team in a hospital or clinic setting. Talk to your care team the use of this medication in children. Special care may be needed. Overdosage: If you think you have taken too much of this medicine contact a poison control center or emergency room at once. NOTE: This medicine is only for you. Do not share this medicine with others. What if I miss a dose? It is important not to miss your dose. Call your care team if you are unable to keep an appointment. What may interact with this medication? Other iron products This list may not describe all possible interactions. Give your health care provider a list of all the medicines, herbs, non-prescription drugs, or dietary supplements you use. Also tell them if you smoke, drink alcohol, or use illegal drugs. Some items may interact with your medicine. What should I watch for while using this medication? Visit your care team regularly. Tell your care team if your symptoms do not start to get better or if they get worse. You may need blood work done while you are taking this  medication. You may need to follow a special diet. Talk to your care team. Foods that contain iron include: whole grains/cereals, dried fruits, beans, or peas, leafy green vegetables, and organ meats (liver, kidney). What side effects may I notice from receiving this medication? Side effects that you should report to your care team as soon as possible: Allergic reactions--skin rash, itching, hives, swelling of the face, lips, tongue, or throat Low blood pressure--dizziness, feeling faint or lightheaded, blurry vision Shortness of breath Side effects that usually do not require medical attention (report to your care team if they continue or are bothersome): Flushing Headache Joint pain Muscle pain Nausea Pain, redness, or irritation at injection site This list may not describe all possible side effects. Call your doctor for medical advice about side effects. You may report side effects to FDA at 1-800-FDA-1088. Where should I keep my medication? This medication is given in a hospital or clinic. It will not be stored at home. NOTE: This sheet is a summary. It may not cover all possible information. If you have questions about this medicine, talk to your doctor, pharmacist, or health care provider.  2024 Elsevier/Gold Standard (2022-12-03 00:00:00)

## 2023-02-03 MED ORDER — PHENTERMINE HCL 15 MG PO CAPS
15.0000 mg | ORAL_CAPSULE | ORAL | 0 refills | Status: DC
Start: 2023-02-03 — End: 2024-01-19

## 2023-02-04 ENCOUNTER — Encounter: Payer: Medicaid Other | Admitting: Physician Assistant

## 2023-02-21 NOTE — Progress Notes (Signed)
This encounter was created in error - please disregard. Family emergency

## 2023-04-21 NOTE — Progress Notes (Signed)
[facility]  [address] [place],  [place]  [address] [phone]  Clinic Day:  04/22/2023  Referring physician: Practice, [facility]   HISTORY OF PRESENT ILLNESS:  The patient is a 32 y.o. female  with iron deficiency anemia, likely related to her menstrual cycles and her recent pregnancies.  She comes in today to reassess her iron and hemoglobin levels after receiving IV iron.  Overall, she claims to feel somewhat better.  Her cycles remain moderately heavy.  She claims her ice cravings are beginning to return.     PHYSICAL EXAM:  Blood pressure 134/76, pulse 71, temperature 98.3 F (36.8 C), resp. rate 16, height 5\' 10"  (1.778 m), weight 208 lb 3.2 oz (94.4 kg), SpO2 99%, unknown if currently breastfeeding. Wt Readings from Last 3 Encounters:  04/22/23 208 lb 3.2 oz (94.4 kg)  02/02/23 204 lb 1.9 oz (92.6 kg)  01/26/23 208 lb 1.3 oz (94.4 kg)   Body mass index is 29.87 kg/m. Performance status (ECOG): 0 - Asymptomatic Physical Exam Constitutional:      Appearance: Normal appearance. She is not ill-appearing.  HENT:     Mouth/Throat:     Mouth: Mucous membranes are moist.     Pharynx: Oropharynx is clear. No oropharyngeal exudate or posterior oropharyngeal erythema.  Cardiovascular:     Rate and Rhythm: Normal rate and regular rhythm.     Heart sounds: No murmur heard.    No friction rub. No gallop.  Pulmonary:     Effort: Pulmonary effort is normal. No respiratory distress.     Breath sounds: Normal breath sounds. No wheezing, rhonchi or rales.  Abdominal:     General: Bowel sounds are normal. There is no distension.     Palpations: Abdomen is soft. There is no mass.     Tenderness: There is no abdominal tenderness.  Musculoskeletal:        General: No swelling.     Right lower leg: No edema.     Left lower leg: No edema.  Lymphadenopathy:     Cervical: No cervical adenopathy.     Upper Body:     Right upper body: No  supraclavicular or axillary adenopathy.     Left upper body: No supraclavicular or axillary adenopathy.     Lower Body: No right inguinal adenopathy. No left inguinal adenopathy.  Skin:    General: Skin is warm.     Coloration: Skin is not jaundiced.     Findings: No lesion or rash.  Neurological:     General: No focal deficit present.     Mental Status: She is alert and oriented to person, place, and time. Mental status is at baseline.  Psychiatric:        Mood and Affect: Mood normal.        Behavior: Behavior normal.        Thought Content: Thought content normal.   LABS:       Latest Ref Rng & Units 01/20/2023   12:00 AM 12/09/2022    9:22 AM 05/13/2022    8:15 AM  CBC  WBC  5.1     5.9  5.9   Hemoglobin 12.0 - 16.0 8.4     8.3  9.7   Hematocrit 36 - 46 28     28.8  33.7   Platelets 150 - 400 K/uL 295     441  325      This result is from an external source.  Latest Ref Rng & Units 12/09/2022    9:22 AM 05/13/2022    8:15 AM 03/23/2022    2:01 PM  CMP  Glucose 70 - 99 mg/dL 96  93  84   BUN 6 - 20 mg/dL 11  12  8    Creatinine 0.57 - 1.00 mg/dL 1.61  0.96  0.45   Sodium 134 - 144 mmol/L 135  137  136   Potassium 3.5 - 5.2 mmol/L 4.8  4.7  4.7   Chloride 96 - 106 mmol/L 102  102  101   CO2 20 - 29 mmol/L 19  23  19    Calcium 8.7 - 10.2 mg/dL 9.1  9.5  9.2   Total Protein 6.0 - 8.5 g/dL 7.1  7.3  7.4   Total Bilirubin 0.0 - 1.2 mg/dL <4.0  <9.8  0.2   Alkaline Phos 44 - 121 IU/L 88  106  121   AST 0 - 40 IU/L 20  27  38   ALT 0 - 32 IU/L 12  20  36     Latest Reference Range & Units 04/22/23 10:17  Iron 28 - 170 ug/dL 45  UIBC ug/dL 119  TIBC 147 - 829 ug/dL 562 (H)  Saturation Ratios 10.4 - 31.8 % 10 (L)  Ferritin 11 - 307 ng/mL 4 (L)  (H): Data is abnormally high (L): Data is abnormally low  ASSESSMENT & PLAN:  A 32 y.o. female with iron deficiency anemia.  Despite her hemoglobin being much better after her IV iron, her iron levels remain low.  Based upon  this, I will arrange for her to receive another course of IV iron over these next few weeks to rapidly replenish her iron stores and normalize her hemoglobin.  I will see her back in 3 months to reassess her iron and hemoglobin levels to see how well she responded to her upcoming IV iron.  The patient understands all the plans discussed today and is in agreement with them.  Lovett Coffin Kirby Funk, MD

## 2023-04-22 ENCOUNTER — Telehealth: Payer: Self-pay

## 2023-04-22 ENCOUNTER — Other Ambulatory Visit: Payer: Self-pay | Admitting: Oncology

## 2023-04-22 ENCOUNTER — Inpatient Hospital Stay: Payer: Medicaid Other | Attending: Oncology | Admitting: Oncology

## 2023-04-22 ENCOUNTER — Inpatient Hospital Stay: Payer: Medicaid Other

## 2023-04-22 VITALS — BP 134/76 | HR 71 | Temp 98.3°F | Resp 16 | Ht 70.0 in | Wt 208.2 lb

## 2023-04-22 DIAGNOSIS — D508 Other iron deficiency anemias: Secondary | ICD-10-CM

## 2023-04-22 DIAGNOSIS — D509 Iron deficiency anemia, unspecified: Secondary | ICD-10-CM | POA: Diagnosis present

## 2023-04-22 DIAGNOSIS — D500 Iron deficiency anemia secondary to blood loss (chronic): Secondary | ICD-10-CM

## 2023-04-22 LAB — CBC AND DIFFERENTIAL
HCT: 37 (ref 36–46)
Hemoglobin: 12.7 (ref 12.0–16.0)
Neutrophils Absolute: 2.02
Platelets: 302 10*3/uL (ref 150–400)
WBC: 4.8

## 2023-04-22 LAB — FERRITIN: Ferritin: 4 ng/mL — ABNORMAL LOW (ref 11–307)

## 2023-04-22 LAB — IRON AND TIBC
Iron: 45 ug/dL (ref 28–170)
Saturation Ratios: 10 % — ABNORMAL LOW (ref 10.4–31.8)
TIBC: 451 ug/dL — ABNORMAL HIGH (ref 250–450)
UIBC: 406 ug/dL

## 2023-04-22 LAB — CBC: RBC: 4.71 (ref 3.87–5.11)

## 2023-04-22 NOTE — Telephone Encounter (Addendum)
Latest Reference Range & Units 04/22/23 10:17  Iron 28 - 170 ug/dL 45  UIBC ug/dL 161  TIBC 096 - 045 ug/dL 409 (H)  Saturation Ratios 10.4 - 31.8 % 10 (L)  Ferritin 11 - 307 ng/mL 4 (L)  (H): Data is abnormally high (L): Data is abnormally low

## 2023-04-23 ENCOUNTER — Encounter: Payer: Self-pay | Admitting: Oncology

## 2023-04-27 ENCOUNTER — Telehealth: Payer: Self-pay | Admitting: Oncology

## 2023-04-27 NOTE — Telephone Encounter (Signed)
Patient has been scheduled. Aware of appt date and time.   Scheduling Message Entered by Rennis Harding A on 04/22/2023 at  6:33 PM Priority: Routine <No visit type provided>  Department: [facility]  Provider:  Scheduling Notes:  IV iron on 04-29-23  Labs/appt 07-22-23

## 2023-05-06 ENCOUNTER — Encounter: Payer: Self-pay | Admitting: Oncology

## 2023-05-06 MED FILL — Ferumoxytol Inj 510 MG/17ML (30 MG/ML) (Elemental Fe): INTRAVENOUS | Qty: 17 | Status: AC

## 2023-05-09 ENCOUNTER — Inpatient Hospital Stay: Payer: Medicaid Other

## 2023-05-09 VITALS — BP 122/66 | HR 74 | Temp 97.9°F | Resp 16 | Ht 70.0 in | Wt 210.1 lb

## 2023-05-09 DIAGNOSIS — D509 Iron deficiency anemia, unspecified: Secondary | ICD-10-CM | POA: Diagnosis not present

## 2023-05-09 DIAGNOSIS — D508 Other iron deficiency anemias: Secondary | ICD-10-CM

## 2023-05-09 MED ORDER — SODIUM CHLORIDE 0.9 % IV SOLN: INTRAVENOUS | Status: DC | PRN

## 2023-05-09 MED ORDER — SODIUM CHLORIDE 0.9% FLUSH: 10.0000 mL | Freq: Two times a day (BID) | INTRAVENOUS | Status: DC

## 2023-05-09 MED ORDER — FERUMOXYTOL INJECTION 510 MG/17 ML
510.0000 mg | Freq: Once | INTRAVENOUS | Status: AC
  Administered 2023-05-09: 510 mg via INTRAVENOUS
  Filled 2023-05-09: qty 510

## 2023-05-09 NOTE — Patient Instructions (Addendum)
Ferumoxytol Injection What is this medication? FERUMOXYTOL (FER ue MOX i tol) treats low levels of iron in your body (iron deficiency anemia). Iron is a mineral that plays an important role in making red blood cells, which carry oxygen from your lungs to the rest of your body. This medicine may be used for other purposes; ask your health care provider or pharmacist if you have questions. COMMON BRAND NAME(S): Feraheme What should I tell my care team before I take this medication? They need to know if you have any of these conditions: Anemia not caused by low iron levels High levels of iron in the blood Magnetic resonance imaging (MRI) test scheduled An unusual or allergic reaction to iron, other medications, foods, dyes, or preservatives Pregnant or trying to get pregnant Breastfeeding How should I use this medication? This medication is injected into a vein. It is given by your care team in a hospital or clinic setting. Talk to your care team the use of this medication in children. Special care may be needed. Overdosage: If you think you have taken too much of this medicine contact a poison control center or emergency room at once. NOTE: This medicine is only for you. Do not share this medicine with others. What if I miss a dose? It is important not to miss your dose. Call your care team if you are unable to keep an appointment. What may interact with this medication? Other iron products This list may not describe all possible interactions. Give your health care provider a list of all the medicines, herbs, non-prescription drugs, or dietary supplements you use. Also tell them if you smoke, drink alcohol, or use illegal drugs. Some items may interact with your medicine. What should I watch for while using this medication? Visit your care team regularly. Tell your care team if your symptoms do not start to get better or if they get worse. You may need blood work done while you are taking this  medication. You may need to follow a special diet. Talk to your care team. Foods that contain iron include: whole grains/cereals, dried fruits, beans, or peas, leafy green vegetables, and organ meats (liver, kidney). What side effects may I notice from receiving this medication? Side effects that you should report to your care team as soon as possible: Allergic reactions--skin rash, itching, hives, swelling of the face, lips, tongue, or throat Low blood pressure--dizziness, feeling faint or lightheaded, blurry vision Shortness of breath Side effects that usually do not require medical attention (report to your care team if they continue or are bothersome): Flushing Headache Joint pain Muscle pain Nausea Pain, redness, or irritation at injection site This list may not describe all possible side effects. Call your doctor for medical advice about side effects. You may report side effects to FDA at 1-800-FDA-1088. Where should I keep my medication? This medication is given in a hospital or clinic. It will not be stored at home. NOTE: This sheet is a summary. It may not cover all possible information. If you have questions about this medicine, talk to your doctor, pharmacist, or health care provider.  2024 Elsevier/Gold Standard (2022-12-03 00:00:00)   Iron-Rich Diet  Iron is a mineral that helps your body produce hemoglobin. Hemoglobin is a protein in red blood cells that carries oxygen to your body's tissues. Eating too little iron may cause you to feel weak and tired, and it can increase your risk of infection. Iron is naturally found in many foods, and many foods  have iron added to them (are iron-fortified). You may need to follow an iron-rich diet if you do not have enough iron in your body due to certain medical conditions. The amount of iron that you need each day depends on your age, your sex, and any medical conditions you have. Follow instructions from your health care provider or a  dietitian about how much iron you should eat each day. What are tips for following this plan? Reading food labels Check food labels to see how many milligrams (mg) of iron are in each serving. Cooking Cook foods in pots and pans that are made from iron. Take these steps to make it easier for your body to absorb iron from certain foods: Soak beans overnight before cooking. Soak whole grains overnight and drain them before using. Ferment flours before baking, such as by using yeast in bread dough. Meal planning When you eat foods that contain iron, you should eat them with foods that are high in vitamin C. These include oranges, peppers, tomatoes, potatoes, and mangoes. Vitamin C helps your body absorb iron. Certain foods and drinks prevent your body from absorbing iron properly. Avoid eating these foods in the same meal as iron-rich foods or with iron supplements. These foods include: Coffee, black tea, and red wine. Milk, dairy products, and foods that are high in calcium. Beans and soybeans. Whole grains. General information Take iron supplements only as told by your health care provider. An overdose of iron can be life-threatening. If you were prescribed iron supplements, take them with orange juice or a vitamin C supplement. When you eat iron-fortified foods or take an iron supplement, you should also eat foods that naturally contain iron, such as meat, poultry, and fish. Eating naturally iron-rich foods helps your body absorb the iron that is added to other foods or contained in a supplement. Iron from animal sources is better absorbed than iron from plant sources. What foods should I eat? Fruits Prunes. Raisins. Eat fruits high in vitamin C, such as oranges, grapefruits, and strawberries, with iron-rich foods. Vegetables Spinach (cooked). Green peas. Broccoli. Fermented vegetables. Eat vegetables high in vitamin C, such as leafy greens, potatoes, bell peppers, and tomatoes, with  iron-rich foods. Grains Iron-fortified breakfast cereal. Iron-fortified whole-wheat bread. Enriched rice. Sprouted grains. Meats and other proteins Beef liver. Beef. [place]. Chicken. Oysters. Shrimp. Tuna. Sardines. Chickpeas. Nuts. Tofu. Pumpkin seeds. Beverages Tomato juice. Fresh orange juice. Prune juice. Hibiscus tea. Iron-fortified instant breakfast shakes. Sweets and desserts Blackstrap molasses. Seasonings and condiments Tahini. Fermented soy sauce. Other foods Wheat germ. The items listed above may not be a complete list of recommended foods and beverages. Contact a dietitian for more information. What foods should I limit? These are foods that should be limited while eating iron-rich foods as they can reduce the absorption of iron in your body. Grains Whole grains. Bran cereal. Bran flour. Meats and other proteins Soybeans. Products made from soy protein. Black beans. Lentils. Mung beans. Split peas. Dairy Milk. Cream. Cheese. Yogurt. Cottage cheese. Beverages Coffee. Black tea. Red wine. Sweets and desserts Cocoa. Chocolate. Ice cream. Seasonings and condiments Basil. Oregano. Large amounts of parsley. The items listed above may not be a complete list of foods and beverages you should limit. Contact a dietitian for more information. Summary Iron is a mineral that helps your body produce hemoglobin. Hemoglobin is a protein in red blood cells that carries oxygen to your body's tissues. Iron is naturally found in many foods, and many foods have  iron added to them (are iron-fortified). When you eat foods that contain iron, you should eat them with foods that are high in vitamin C. Vitamin C helps your body absorb iron. Certain foods and drinks prevent your body from absorbing iron properly, such as whole grains and dairy products. You should avoid eating these foods in the same meal as iron-rich foods or with iron supplements. This information is not intended to replace  advice given to you by your health care provider. Make sure you discuss any questions you have with your health care provider. Document Revised: 06/09/2020 Document Reviewed: 06/09/2020 Elsevier Patient Education  2024 [employer].

## 2023-05-16 ENCOUNTER — Inpatient Hospital Stay: Payer: Medicaid Other | Attending: Oncology

## 2023-05-16 VITALS — BP 125/78 | HR 80 | Temp 98.4°F | Resp 18 | Wt 208.0 lb

## 2023-05-16 DIAGNOSIS — D509 Iron deficiency anemia, unspecified: Secondary | ICD-10-CM | POA: Insufficient documentation

## 2023-05-16 DIAGNOSIS — D508 Other iron deficiency anemias: Secondary | ICD-10-CM

## 2023-05-16 MED ORDER — SODIUM CHLORIDE 0.9 % IV SOLN
510.0000 mg | Freq: Once | INTRAVENOUS | Status: AC
  Administered 2023-05-16: 510 mg via INTRAVENOUS
  Filled 2023-05-16: qty 510

## 2023-05-16 MED ORDER — SODIUM CHLORIDE 0.9 % IV SOLN: INTRAVENOUS | Status: DC

## 2023-05-16 MED ORDER — SODIUM CHLORIDE 0.9% FLUSH: 10.0000 mL | Freq: Two times a day (BID) | INTRAVENOUS | Status: DC

## 2023-05-16 NOTE — Patient Instructions (Signed)
 Ferumoxytol Injection What is this medication? FERUMOXYTOL (FER ue MOX i tol) treats low levels of iron in your body (iron deficiency anemia). Iron is a mineral that plays an important role in making red blood cells, which carry oxygen from your lungs to the rest of your body. This medicine may be used for other purposes; ask your health care provider or pharmacist if you have questions. COMMON BRAND NAME(S): Feraheme What should I tell my care team before I take this medication? They need to know if you have any of these conditions: Anemia not caused by low iron levels High levels of iron in the blood Magnetic resonance imaging (MRI) test scheduled An unusual or allergic reaction to iron, other medications, foods, dyes, or preservatives Pregnant or trying to get pregnant Breastfeeding How should I use this medication? This medication is injected into a vein. It is given by your care team in a hospital or clinic setting. Talk to your care team the use of this medication in children. Special care may be needed. Overdosage: If you think you have taken too much of this medicine contact a poison control center or emergency room at once. NOTE: This medicine is only for you. Do not share this medicine with others. What if I miss a dose? It is important not to miss your dose. Call your care team if you are unable to keep an appointment. What may interact with this medication? Other iron products This list may not describe all possible interactions. Give your health care provider a list of all the medicines, herbs, non-prescription drugs, or dietary supplements you use. Also tell them if you smoke, drink alcohol, or use illegal drugs. Some items may interact with your medicine. What should I watch for while using this medication? Visit your care team regularly. Tell your care team if your symptoms do not start to get better or if they get worse. You may need blood work done while you are taking this  medication. You may need to follow a special diet. Talk to your care team. Foods that contain iron include: whole grains/cereals, dried fruits, beans, or peas, leafy green vegetables, and organ meats (liver, kidney). What side effects may I notice from receiving this medication? Side effects that you should report to your care team as soon as possible: Allergic reactions--skin rash, itching, hives, swelling of the face, lips, tongue, or throat Low blood pressure--dizziness, feeling faint or lightheaded, blurry vision Shortness of breath Side effects that usually do not require medical attention (report to your care team if they continue or are bothersome): Flushing Headache Joint pain Muscle pain Nausea Pain, redness, or irritation at injection site This list may not describe all possible side effects. Call your doctor for medical advice about side effects. You may report side effects to FDA at 1-800-FDA-1088. Where should I keep my medication? This medication is given in a hospital or clinic. It will not be stored at home. NOTE: This sheet is a summary. It may not cover all possible information. If you have questions about this medicine, talk to your doctor, pharmacist, or health care provider.  2024 Elsevier/Gold Standard (2022-12-03 00:00:00)

## 2023-05-17 ENCOUNTER — Encounter: Payer: BC Managed Care – PPO | Admitting: Nurse Practitioner

## 2023-05-30 ENCOUNTER — Encounter: Payer: BC Managed Care – PPO | Admitting: Nurse Practitioner

## 2023-06-30 ENCOUNTER — Encounter: Payer: BC Managed Care – PPO | Admitting: Nurse Practitioner

## 2023-07-04 IMAGING — US US MFM OB DETAIL+14 WK
1 series · 15 of 28 positions shown · non-contrast
Comparison: none

[Series 1: us mfm ob detail+14 wk · 139 acquisitions, 15 frames shown]
[im 1/139]
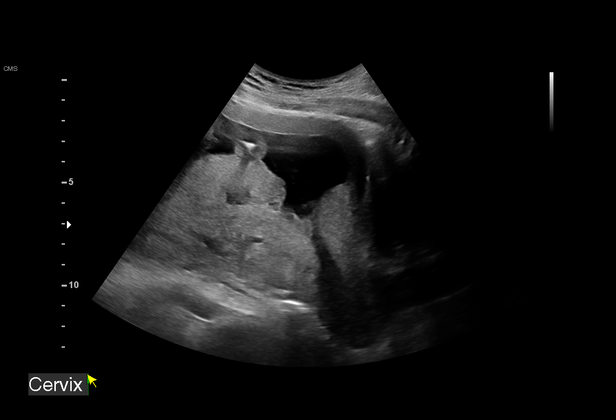
[im 11/139]
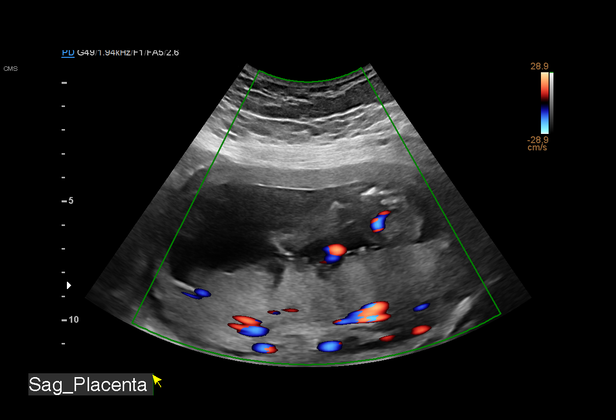
[im 21/139]
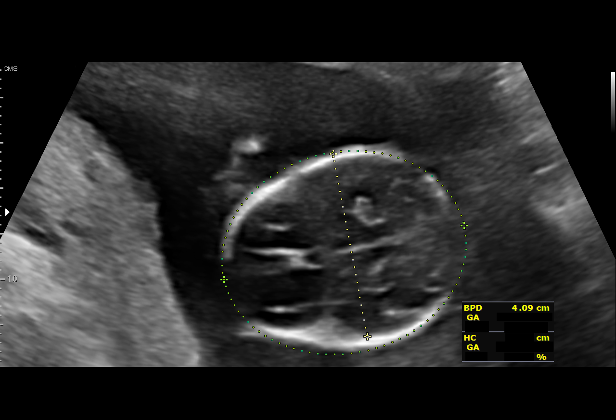
[im 31/139]
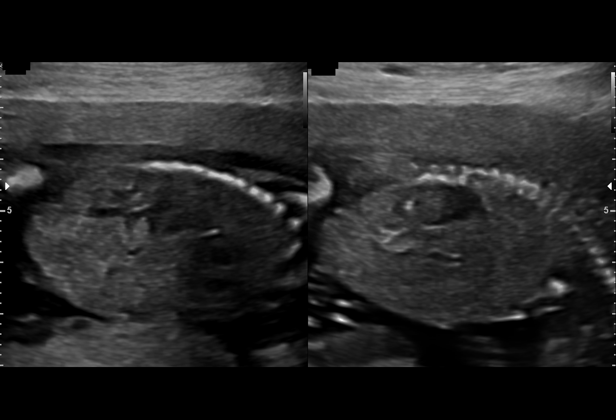
[im 41/139]
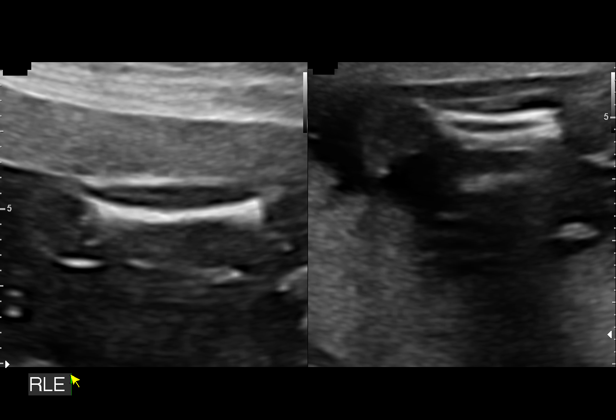
[im 52/139]
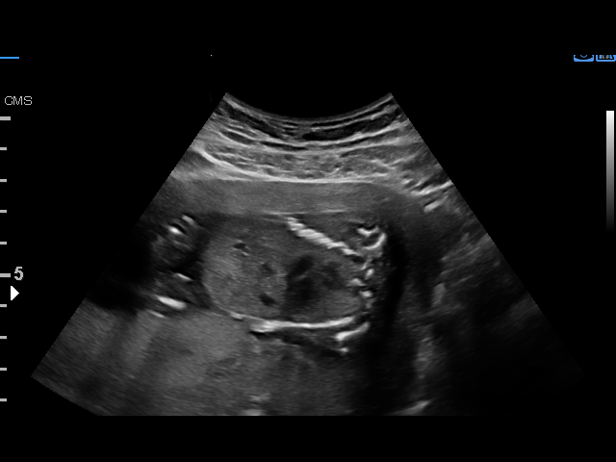
[im 62/139]
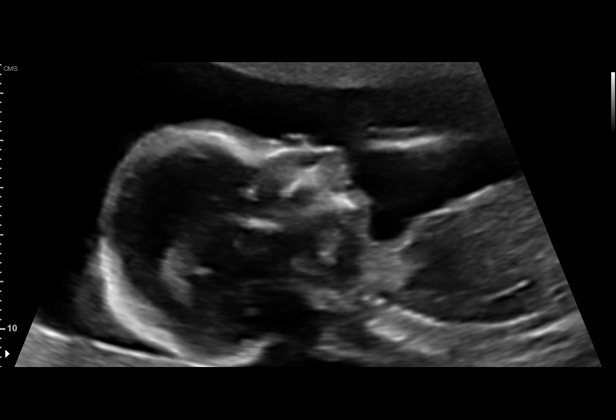
[im 72/139]
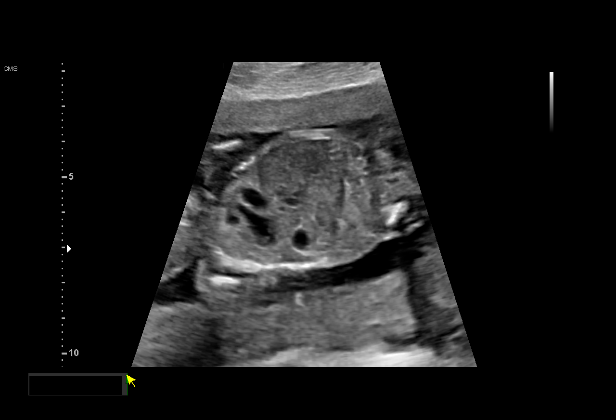
[im 77/139]
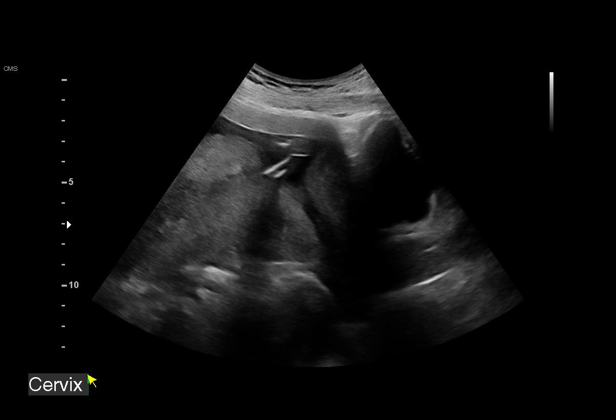
[im 87/139]
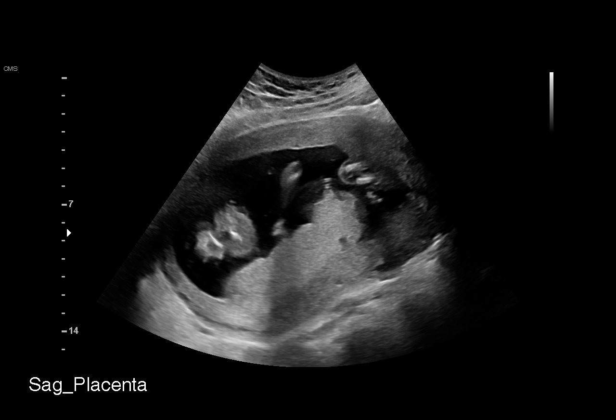
[im 98/139]
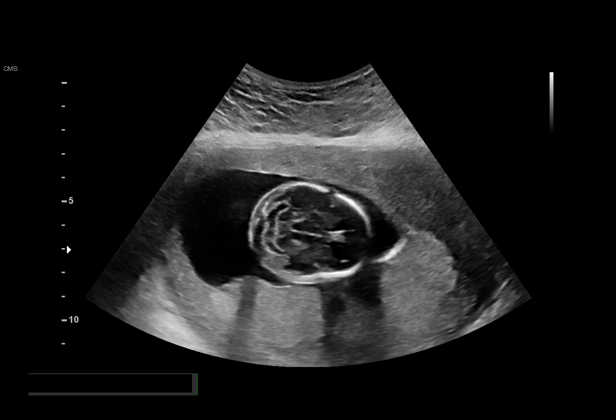
[im 108/139]
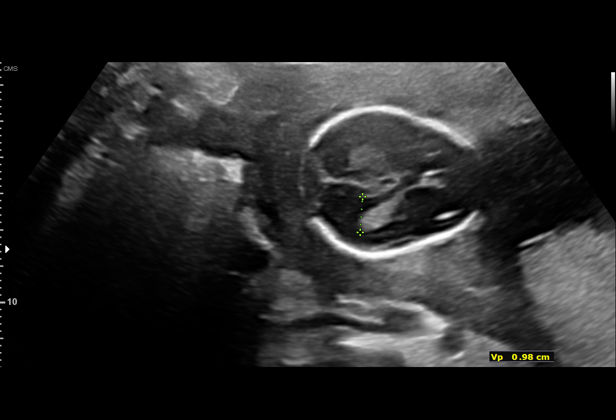
[im 118/139]
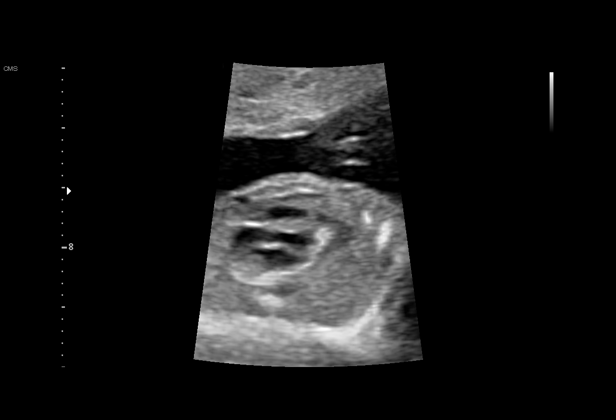
[im 128/139]
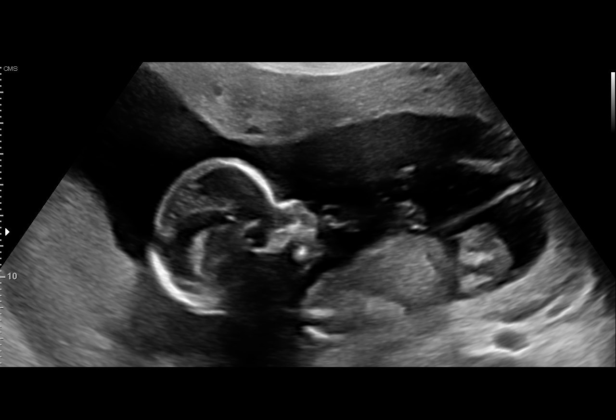
[im 139/139]
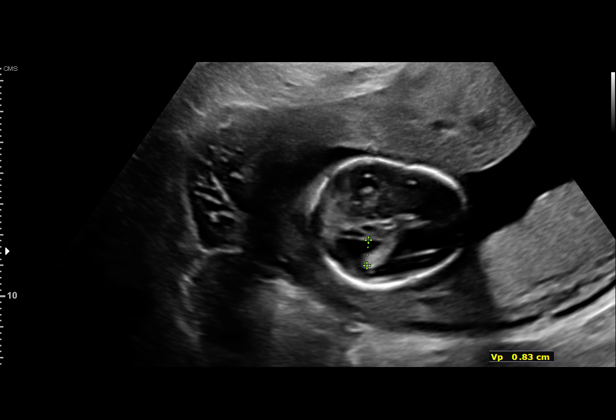

[15 of 28 positions shown; findings below may reference images not displayed]

OB

Indications

 Cerebral ventriculomegaly
 18 weeks gestation of pregnancy
 Antenatal screening for malformations
 Previous pregnancy with congenital
 anomaly, antepartum
 Obesity complicating pregnancy, second
 trimester (pregravid BMI 32)
 Poor obstetric history: Previous fetal growth
 restriction (FGR)
 Medical complication of pregnancy ([place]
 Parkinson White)
Fetal Evaluation

 Num Of Fetuses:         1
 Fetal Heart Rate(bpm):  155
 Cardiac Activity:       Observed
 Presentation:           Variable
 Placenta:               Posterior
 P. Cord Insertion:      Visualized

 Amniotic Fluid
 AFI FV:      Within normal limits

                             Largest Pocket(cm)

Biometry

 BPD:      41.3  mm     G. Age:  18w 4d         67  %    CI:        73.55   %    70 - 86
                                                         FL/HC:      14.6   %    15.8 - 18
 HC:       153   mm     G. Age:  18w 2d         50  %    HC/AC:      1.21        1.07 -
 AC:      126.4  mm     G. Age:  18w 2d         50  %    FL/BPD:     54.2   %
 FL:       22.4  mm     G. Age:  16w 5d          6  %    FL/AC:      17.7   %    20 - 24
 HUM:      22.6  mm     G. Age:  17w 0d         18  %
 CER:        18  mm     G. Age:  17w 6d         40  %
 NFT:       3.4  mm
 LV:       10.2  mm
 CM:        5.1  mm

 Est. FW:     202  gm      0 lb 7 oz     17  %
OB History

 Gravidity:    5         Term:   0        Prem:   2        SAB:   2
 TOP:          0       Ectopic:  0        Living: 1
Gestational Age

 LMP:           18w 1d        Date:  05/17/21                 EDD:   02/21/22
 U/S Today:     18w 0d                                        EDD:   02/22/22
 Best:          18w 1d     Det. By:  LMP  (05/17/21)          EDD:   02/21/22
Anatomy

 Cranium:               Appears normal         Aortic Arch:            Appears normal
 Cavum:                 Not well visualized    Ductal Arch:            Not well visualized
 Ventricles:            Ventriculomegaly       Diaphragm:              Appears normal
                        10 mm
 Choroid Plexus:        Appears normal         Stomach:                Appears normal, left
                                                                       sided
 Cerebellum:            Appears normal         Abdomen:                Appears normal
 Posterior Fossa:       Appears normal         Abdominal Wall:         Appears nml (cord
                                                                       insert, abd wall)
 Nuchal Fold:           Appears normal         Cord Vessels:           Appears normal (3
                                                                       vessel cord)
 Lips:                  Appears normal         Kidneys:                Appear normal
 Palate:                Not well visualized    Bladder:                Appears normal
 Thoracic:              Appears normal         Spine:                  Not well visualized
 Heart:                 Not well visualized    Upper Extremities:      Appears normal
 RVOT:                  Not well visualized    Lower Extremities:      Appears normal
 LVOT:                  Appears normal

 Other:  Heels/feet, nasal bone and lenses visualized. Technically difficult due
         to maternal habitus and fetal position.
Cervix Uterus Adnexa

 Cervix
 Sharhonda Mazariegos and closed

 Uterus
 No abnormality visualized.
 Right Ovary
 Not visualized.

 Left Ovary
 Within normal limits.

 Cul De Sac
 No free fluid seen.

 Adnexa
 No abnormality visualized.
Impression

 We performed a fetal anatomical survey.  Amniotic fluid is
 normal and good fetal activity seen.  Fetal biometry is
 consistent with the previously established dates.  Important
 findings include:
 -Mild unilateral ventriculomegaly (left) is seen. CSP is not
 visualized. Rest of the intracranial structures including
 posterior fossa appear normal.
 -Retrognathia/micrognathia (inferior facial angle is less than
 50 degrees).
 -Nuchal-fold thickness appears normal. Kidneys appear
 normal.
 -Cardiac axis appears normal.
 Fetal anatomical survey was not completed because of fetal
 position and early gestational age.
 xxxxxxxxxxxxxxxxxxxxxxxxxxxxxxxxxxxxxx
 Consultation (see [REDACTED] )

 I had the pleasure of seeing Ms. Elyse today at the
 weeks' gestation and is here for fetal anatomy scan and
 consultation.

 Obstetric history
 -[DATE]: At fetal anatomy scan performed at 21 weeks
 gestation, multiple fetal anomalies were noted.  Significant
 findings included thickened nuchal fold, ventriculomegaly,
 micrognathia abnormal orbits and cardiac anomaly
 (suspected interrupted arch).  Patient had amniocentesis and
 fetal karyotype (46,XY) and microarray were normal.
 Subsequently, patient had termination of pregnancy that was
 performed in [place].
 She had early spontaneous miscarriage following that
 pregnancy.
 In June 2020, she had a preterm delivery at 36 weeks
 gestation of a female infant weighing 4 pounds and 15
 ounces at birth.  Her daughter is in good health.
 GYN history: No history of abnormal Pap smears or cervical
 surgeries.
 Past medical history: Wolff-Parkinson-White syndrome and
 the patient had cardiac ablation in 8664 and has not had
 recurrence of arrhythmias.
 Past surgical history: Cardiac ablation, tonsillectomy.
 Medications: Prenatal vitamins, iron supplements.
 Allergies: No known drug allergies.
 Social history: Denies tobacco or drug or alcohol use.  She is
 an assembly worker in a factory.  Her partner is African
 American and he is in good health.  He is a father of her
 daughter (not the first pregnancy).
 Family history: No history of venous thromboembolism in the
 family.
 Prenatal: On cell-free fetal DNA screening, the risks of fetal
 aneuploidies are not increased

 Ventriculomegaly
 I counseled the patient that ventriculomegaly can be
 associated with fetal chromosomal malformations, genetic
 syndromes, fetal infection, and normal fetus. Given that she
 had low risk for fetal Down syndrome on cell-free fetal DNA
 screening, this finding is unlikely to be associated with Down
 syndrome.

 I reassured the patient that in most cases, isolated borderline
 ventriculomegaly is not associated with adverse
 neurodevelopmental outcomes. Intracranial structures
 including CSP need to be reassessed in 3 weeks.  It is also
 likely that ventriculomegaly may resolve with advancing
 gestation. If progression is seen, MRI may be advised to rule
 out structural anomalies.

 I discussed amniocentesis to rule out chromosomal
 anomalies. Amniocentesis gives information on full karyotype
 and microarray can also be performed to detect single gene
 disorders.

 Since ventriculomegaly can be associated with intrauterine
 infections, I recommend serology for toxoplasmosis, CMV
 and Parvo B19 infections.

 Patient would like to wait till after her next ultrasound and
 decide on amniocentesis. She opted not to have
 amniocentesis.
 I recommended fetal echocardiography.

 Retrognathia/Micrognathia
 Explained the finding that suggests the possibility of
 retrognathia and this could be familial. Facial anatomy
 including orbits appears normal.

 History of preterm delivery
 Patient delivered at 36 weeks gestation.  History of preterm
 delivery with a single most important cause of recurrent
 preterm delivery.  She was counseled on prophylactic
 progesterone injections at your office.  I discussed the benefit
 of progesterone injections that is likely to reduce the
 recurrence rate of preterm delivery.  I have reassured her that
 there are no long-term adverse effects in the newborns.
 Patient will consider progesterone.

 History of Wolff-Parkinson-White syndrome
 Patient has successful ablation and has not had recurrence
 of cardiac arrhythmias.  She had successful pregnancy
 outcomes.  I reassured her that in the absence of symptoms,
 I do not expect any adverse outcomes
Recommendations

 -An appointment was made for her to return in 3 weeks for
 completion of fetal anatomy. To reassess CSP, lateral
 ventricles and profile. Transvaginal ultrasound to evaluate
 cervical length at next visit.
 -Toxoplasmosis and CMV serology (IgG and IgM) to
 performed at your office (discussed with Dr. Grieteke)
 -We have requested an appointment for fetal
 echocardiography ([facility]).
                 Vences, Makiya

## 2023-07-11 ENCOUNTER — Encounter: Payer: Self-pay | Admitting: Oncology

## 2023-07-21 ENCOUNTER — Other Ambulatory Visit: Payer: Self-pay

## 2023-07-21 DIAGNOSIS — D508 Other iron deficiency anemias: Secondary | ICD-10-CM

## 2023-07-21 NOTE — Progress Notes (Signed)
 [facility]  [address] [place],  [place]  [address] [phone]  Clinic Day:  07/22/2023  Referring physician: Milon Cleaves, PA   HISTORY OF PRESENT ILLNESS:  The patient is a 33 y.o. female  with iron deficiency anemia, likely related to her menstrual cycles and her recent pregnancies.  She comes in today to reassess her iron and hemoglobin levels after receiving IV iron.  Overall, she claims to feel somewhat better.  However, her cycles remain fairly heavy.  She denies having other overt forms of blood loss.    PHYSICAL EXAM:  Blood pressure 127/77, pulse 80, temperature 99.1 F (37.3 C), resp. rate 14, height 5' 10 (1.778 m), weight 207 lb 9.6 oz (94.2 kg), SpO2 99%, unknown if currently breastfeeding. Wt Readings from Last 3 Encounters:  07/22/23 207 lb 9.6 oz (94.2 kg)  05/16/23 208 lb (94.3 kg)  05/09/23 210 lb 1.3 oz (95.3 kg)   Body mass index is 29.79 kg/m. Performance status (ECOG): 0 - Asymptomatic Physical Exam Constitutional:      Appearance: Normal appearance. She is not ill-appearing.  HENT:     Mouth/Throat:     Mouth: Mucous membranes are moist.     Pharynx: Oropharynx is clear. No oropharyngeal exudate or posterior oropharyngeal erythema.  Cardiovascular:     Rate and Rhythm: Normal rate and regular rhythm.     Heart sounds: No murmur heard.    No friction rub. No gallop.  Pulmonary:     Effort: Pulmonary effort is normal. No respiratory distress.     Breath sounds: Normal breath sounds. No wheezing, rhonchi or rales.  Abdominal:     General: Bowel sounds are normal. There is no distension.     Palpations: Abdomen is soft. There is no mass.     Tenderness: There is no abdominal tenderness.  Musculoskeletal:        General: No swelling.     Right lower leg: No edema.     Left lower leg: No edema.  Lymphadenopathy:     Cervical: No cervical adenopathy.     Upper Body:     Right upper body: No supraclavicular or  axillary adenopathy.     Left upper body: No supraclavicular or axillary adenopathy.     Lower Body: No right inguinal adenopathy. No left inguinal adenopathy.  Skin:    General: Skin is warm.     Coloration: Skin is not jaundiced.     Findings: No lesion or rash.  Neurological:     General: No focal deficit present.     Mental Status: She is alert and oriented to person, place, and time. Mental status is at baseline.  Psychiatric:        Mood and Affect: Mood normal.        Behavior: Behavior normal.        Thought Content: Thought content normal.   LABS:      Latest Ref Rng & Units 07/22/2023    9:57 AM 04/22/2023   12:00 AM 01/20/2023   12:00 AM  CBC  WBC 4.0 - 10.5 K/uL 5.7  4.8     5.1      Hemoglobin 12.0 - 15.0 g/dL 86.7  87.2     8.4      Hematocrit 36.0 - 46.0 % 38.0  37     28      Platelets 150 - 400 K/uL 324  302     295  This result is from an external source.      Latest Ref Rng & Units 12/09/2022    9:22 AM 05/13/2022    8:15 AM 03/23/2022    2:01 PM  CMP  Glucose 70 - 99 mg/dL 96  93  84   BUN 6 - 20 mg/dL 11  12  8    Creatinine 0.57 - 1.00 mg/dL 9.34  9.44  9.53   Sodium 134 - 144 mmol/L 135  137  136   Potassium 3.5 - 5.2 mmol/L 4.8  4.7  4.7   Chloride 96 - 106 mmol/L 102  102  101   CO2 20 - 29 mmol/L 19  23  19    Calcium 8.7 - 10.2 mg/dL 9.1  9.5  9.2   Total Protein 6.0 - 8.5 g/dL 7.1  7.3  7.4   Total Bilirubin 0.0 - 1.2 mg/dL <9.7  <9.7  0.2   Alkaline Phos 44 - 121 IU/L 88  106  121   AST 0 - 40 IU/L 20  27  38   ALT 0 - 32 IU/L 12  20  36     Latest Reference Range & Units 04/22/23 10:17 07/22/23 09:57  Iron 28 - 170 ug/dL 45 47  UIBC ug/dL 593 724  TIBC 749 - 549 ug/dL 548 (H) 677  Saturation Ratios 10.4 - 31.8 % 10 (L) 15  Ferritin 11 - 307 ng/mL 4 (L) 25  (H): Data is abnormally high (L): Data is abnormally low  ASSESSMENT & PLAN:  A 33 y.o. female with iron deficiency anemia.  I am pleased with the improvement in her iron and  hemoglobin levels after receiving her IV iron.  I still want her to work with her primary care provider and/or gynecologist in figuring out how best to control her menstrual cycles.  Overall, she is doing well.  I have no problem with her taking 1 iron daily.  She can take 2 iron pills during the days of her menstrual cycles.  Otherwise, I will see her back in 6 months for repeat clinical assessment.  The patient understands all the plans discussed today and is in agreement with them.  Amsi Grimley DELENA Kerns, MD

## 2023-07-22 ENCOUNTER — Inpatient Hospital Stay: Payer: Medicaid Other

## 2023-07-22 ENCOUNTER — Inpatient Hospital Stay: Payer: Medicaid Other | Attending: Oncology | Admitting: Oncology

## 2023-07-22 ENCOUNTER — Other Ambulatory Visit: Payer: Self-pay | Admitting: Oncology

## 2023-07-22 VITALS — BP 127/77 | HR 80 | Temp 99.1°F | Resp 14 | Ht 70.0 in | Wt 207.6 lb

## 2023-07-22 DIAGNOSIS — D500 Iron deficiency anemia secondary to blood loss (chronic): Secondary | ICD-10-CM | POA: Diagnosis not present

## 2023-07-22 DIAGNOSIS — D508 Other iron deficiency anemias: Secondary | ICD-10-CM

## 2023-07-22 DIAGNOSIS — D509 Iron deficiency anemia, unspecified: Secondary | ICD-10-CM | POA: Diagnosis present

## 2023-07-22 LAB — CBC WITH DIFFERENTIAL (CANCER CENTER ONLY)
Abs Immature Granulocytes: 0.01 10*3/uL (ref 0.00–0.07)
Basophils Absolute: 0 10*3/uL (ref 0.0–0.1)
Basophils Relative: 1 %
Eosinophils Absolute: 0.1 10*3/uL (ref 0.0–0.5)
Eosinophils Relative: 1 %
HCT: 38 % (ref 36.0–46.0)
Hemoglobin: 13.2 g/dL (ref 12.0–15.0)
Immature Granulocytes: 0 %
Lymphocytes Relative: 37 %
Lymphs Abs: 2.1 10*3/uL (ref 0.7–4.0)
MCH: 28.4 pg (ref 26.0–34.0)
MCHC: 34.7 g/dL (ref 30.0–36.0)
MCV: 81.7 fL (ref 80.0–100.0)
Monocytes Absolute: 0.4 10*3/uL (ref 0.1–1.0)
Monocytes Relative: 7 %
Neutro Abs: 3 10*3/uL (ref 1.7–7.7)
Neutrophils Relative %: 54 %
Platelet Count: 324 10*3/uL (ref 150–400)
RBC: 4.65 MIL/uL (ref 3.87–5.11)
RDW: 14.7 % (ref 11.5–15.5)
WBC Count: 5.7 10*3/uL (ref 4.0–10.5)
nRBC: 0 % (ref 0.0–0.2)
nRBC: 0 /100{WBCs}

## 2023-07-22 LAB — IRON AND TIBC
Iron: 47 ug/dL (ref 28–170)
Saturation Ratios: 15 % (ref 10.4–31.8)
TIBC: 322 ug/dL (ref 250–450)
UIBC: 275 ug/dL

## 2023-07-22 LAB — FERRITIN: Ferritin: 25 ng/mL (ref 11–307)

## 2023-07-23 ENCOUNTER — Encounter: Payer: Self-pay | Admitting: Oncology

## 2023-07-27 IMAGING — US US MFM OB FOLLOW-UP
1 series · 13 of 28 positions shown · non-contrast
Comparison: none

[Series 1: us mfm ob follow-up · 74 acquisitions, 13 frames shown]
[im 3/74]
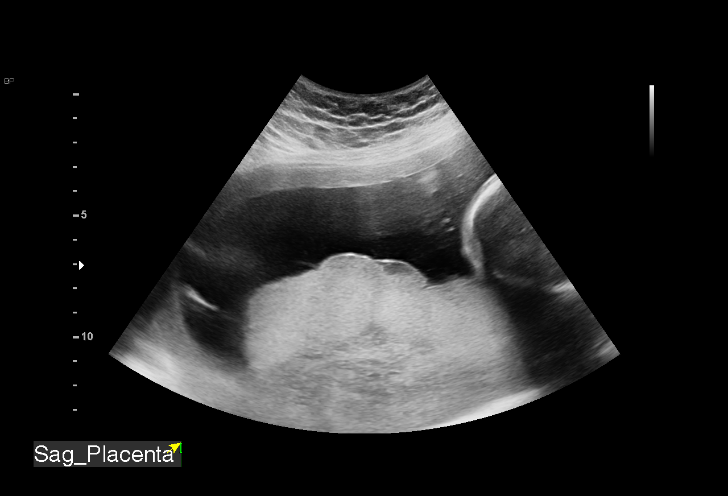
[im 9/74]
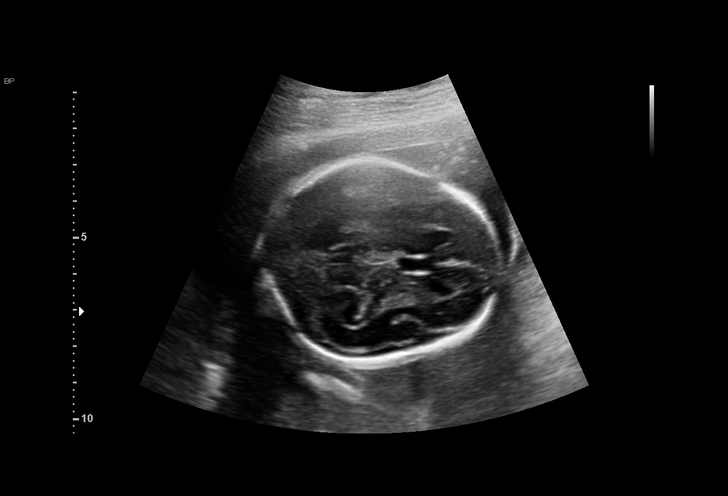
[im 14/74]
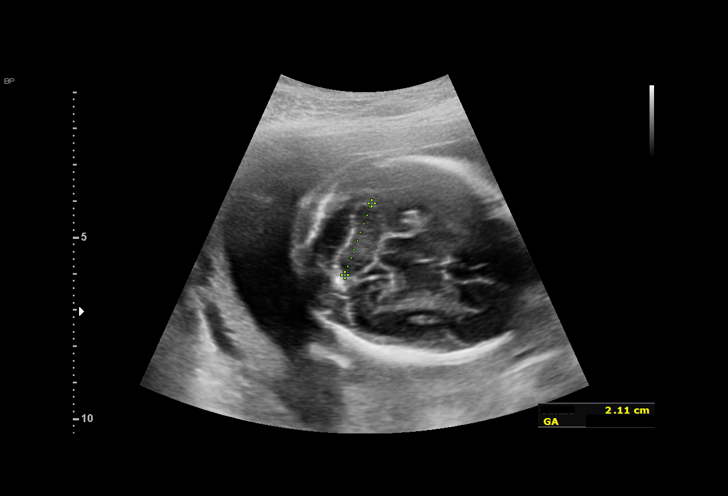
[im 19/74]
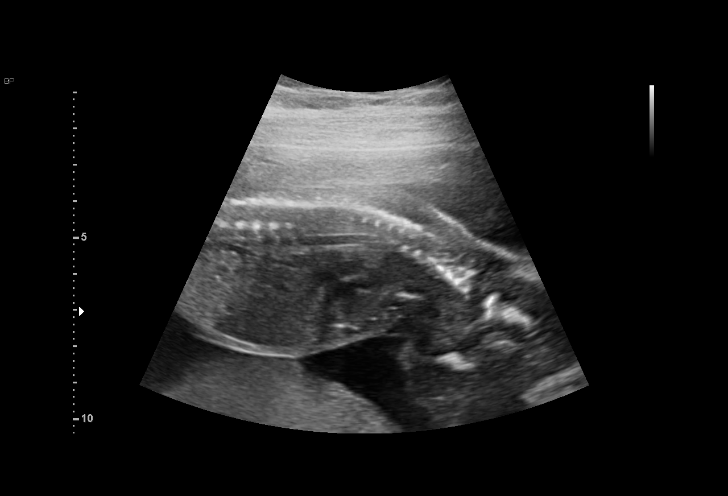
[im 25/74]
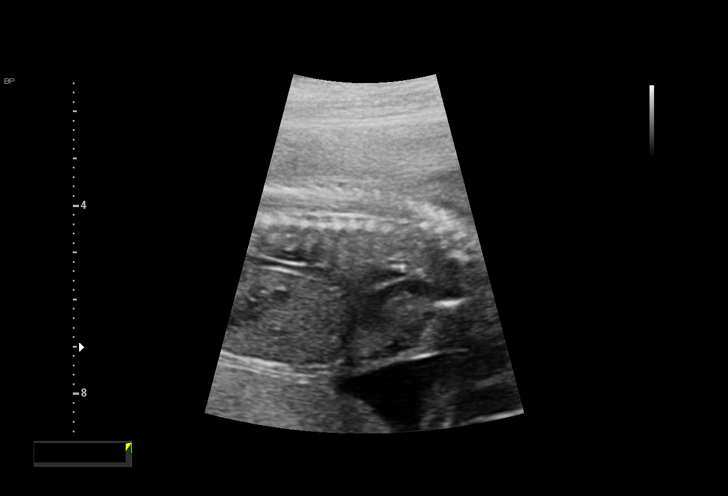
[im 30/74]
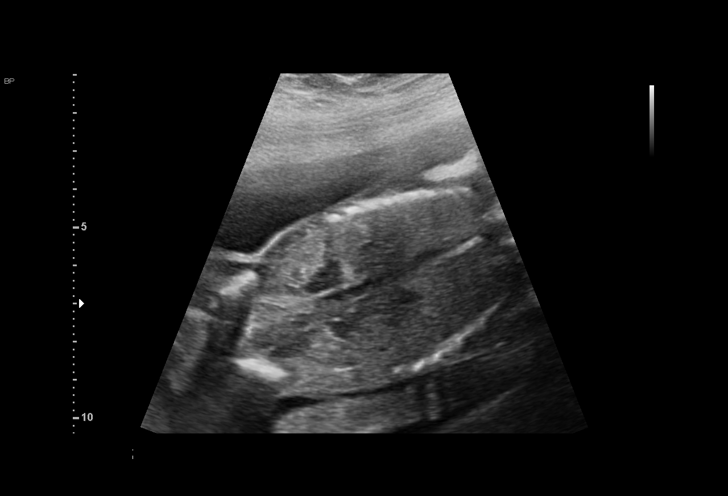
[im 38/74]
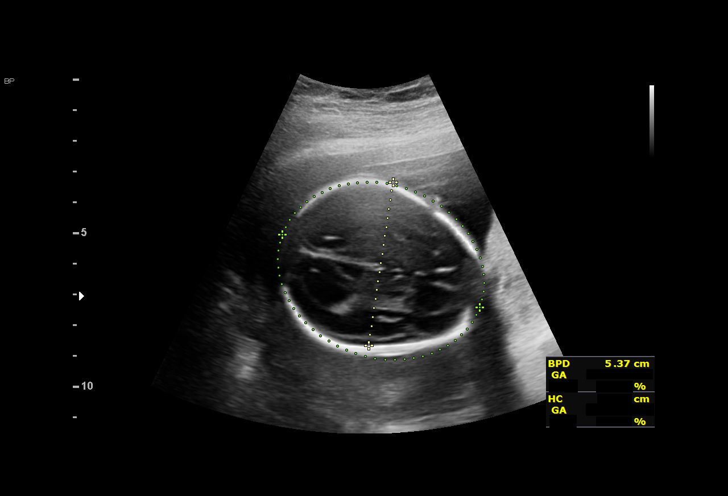
[im 44/74]
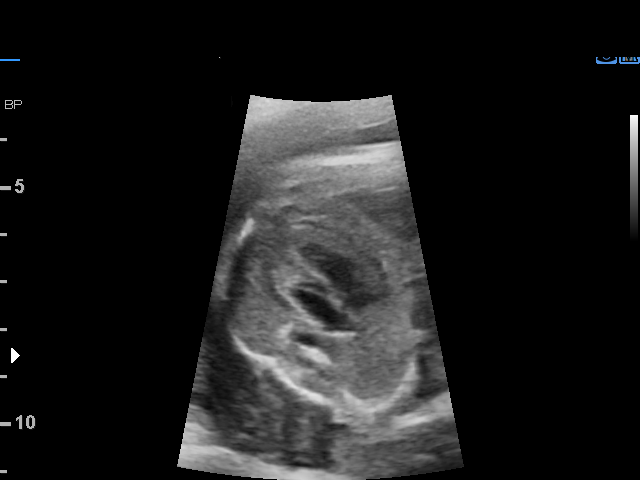
[im 49/74]
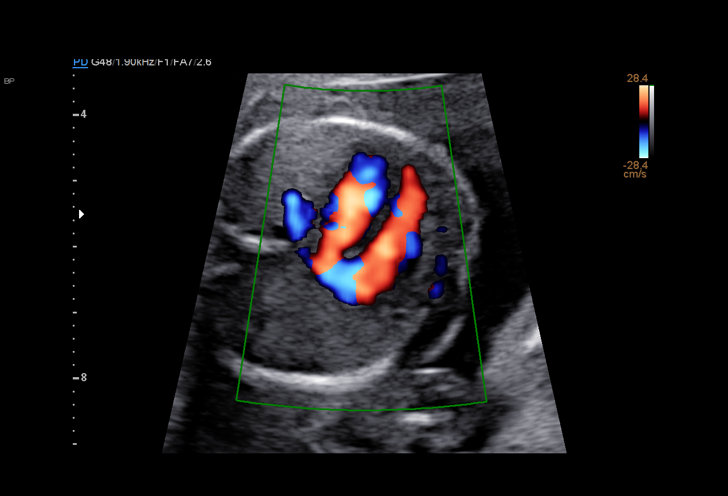
[im 55/74]
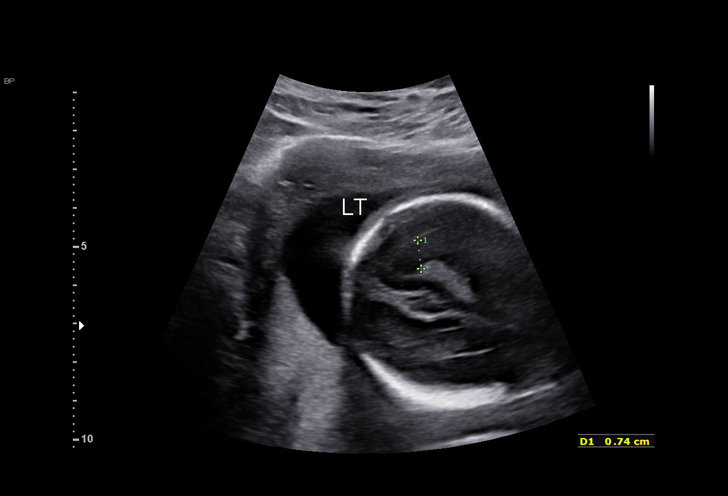
[im 60/74]
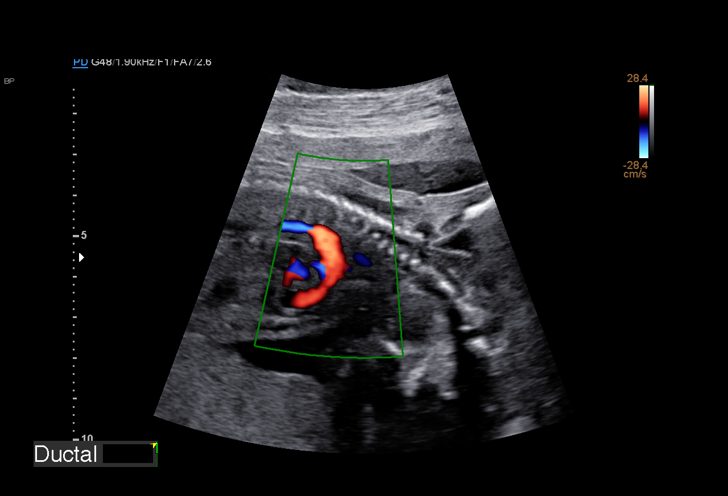
[im 65/74]
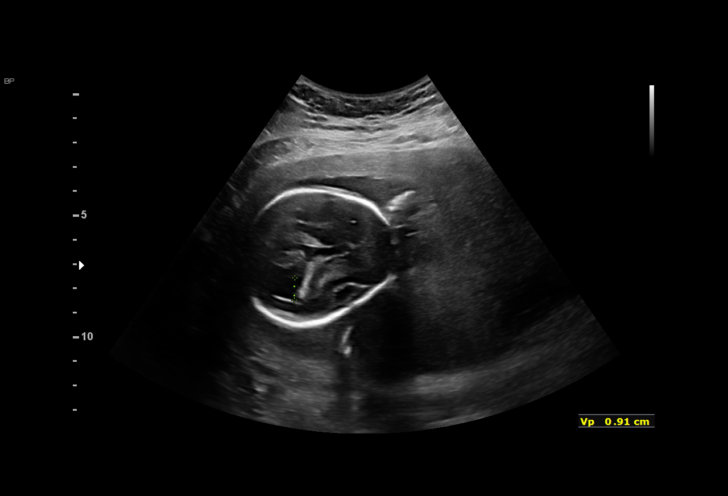
[im 71/74]
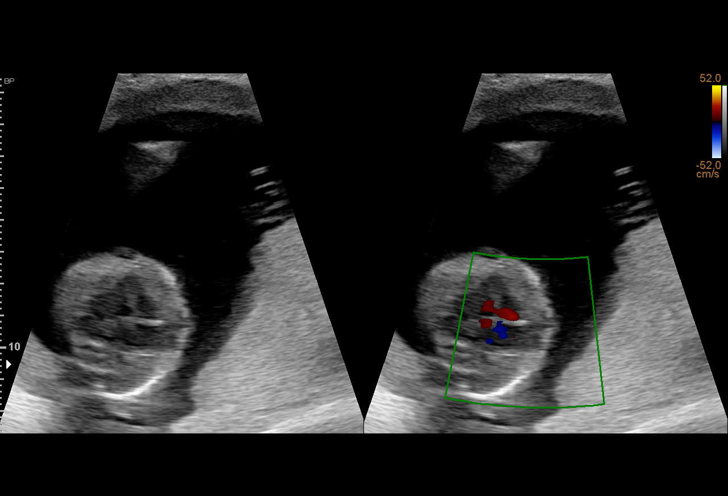

[13 of 28 positions shown; findings below may reference images not displayed]

OB

Indications

 Cerebral ventriculomegaly
 Antenatal screening for malformations
 Previous pregnancy with congenital
 anomaly, antepartum
 Obesity complicating pregnancy, second
 trimester (pregravid BMI 32)
 Poor obstetric history: Previous fetal growth
 restriction (FGR)
 21 weeks gestation of pregnancy
Fetal Evaluation

 Num Of Fetuses:         1
 Fetal Heart Rate(bpm):  166
 Cardiac Activity:       Observed
 Presentation:           Cephalic
 Placenta:               Posterior
 P. Cord Insertion:      Previously Visualized

 Amniotic Fluid
 AFI FV:      Within normal limits

                             Largest Pocket(cm)

Biometry
 BPD:        54  mm     G. Age:  22w 3d         84  %    CI:        75.28   %    70 - 86
                                                         FL/HC:      17.3   %    15.9 -
 HC:      197.4  mm     G. Age:  21w 6d         62  %    HC/AC:      1.21        1.06 -
 AC:      163.6  mm     G. Age:  21w 3d         42  %    FL/BPD:     63.3   %
 FL:       34.2  mm     G. Age:  20w 5d         19  %    FL/AC:      20.9   %    20 - 24
 CER:      21.1  mm     G. Age:  20w 0d         20  %
 LV:        7.7  mm

 Est. FW:     410  gm    0 lb 14 oz      35  %
OB History

 Gravidity:    5         Term:   0        Prem:   2        SAB:   2
 TOP:          0       Ectopic:  0        Living: 1
Gestational Age

 LMP:           21w 3d        Date:  05/17/21                 EDD:   02/21/22
 U/S Today:     21w 4d                                        EDD:   02/20/22
 Best:          21w 3d     Det. By:  LMP  (05/17/21)          EDD:   02/21/22
Anatomy

 Cranium:               Appears normal         Aortic Arch:            Previously seen
 Cavum:                 Appears normal         Ductal Arch:            Appears normal
 Ventricles:            Prev.                  Diaphragm:              Previously seen
                        Ventriculomegaly
 Choroid Plexus:        Previously seen        Stomach:                Appears normal, left
                                                                       sided
 Cerebellum:            Previously seen        Abdomen:                Previously seen
 Posterior Fossa:       Previously seen        Abdominal Wall:         Previously seen
 Nuchal Fold:           Previously seen        Cord Vessels:           Previously seen
 Lips:                  Previously seen        Kidneys:                Appear normal
 Thoracic:              Previously seen        Bladder:                Appears normal
 Heart:                 Abnormal axis          Spine:                  Appears normal
 RVOT:                  Not well visualized    Upper Extremities:      Previously seen
 LVOT:                  Previously seen        Lower Extremities:      Previously seen

 Other:  Heels/feet, nasal bone and lenses previously visualized. Technically
         difficult due to maternal habitus and fetal position. VC, 3VV and 3VTV
         visualized.
Cervix Uterus Adnexa

 Cervix
 Length:           3.97  cm.
 Normal appearance by transabdominal scan.

 Right Ovary
 Visualized.

 Left Ovary
 Visualized.
Impression

 Follow up growth to evaluate the fetal heart, intracranial
 anatomy and fetal profile
 Normal interval growth with measurements consistent with
 dates
 Good fetal movement and amniotic fluid volume

 The prior exam there was concern for abnormal cardiac axis,
 micrognathia/retrognathia and ventriculomegaly.

 Today we observes what appears as a VSD with suspected
 tetrology of Elliot, the ventricles are normal bilaterally ^7 mm
 in diameter. The fetal profile was not well visualized but
 appears consistent with micrognathia.

 The palate appears intact.

 We reviewed the recommendation for amniocentesis as well
 as the risk and benefits. She conveyed that after the prior
 consultation she had decided against an amniocentesis. She
 did opt for a cell free DNA.

 She is scheduled at the end of the month for a fetal
 echocardiogram at [facility].
 Follow up growth is scheduled in 4 weeks.
Recommendations

 Follow up growth in 4 weeks.

## 2024-01-18 NOTE — Progress Notes (Unsigned)
 [facility]  [address] [place],  [place]  [address] [phone]  Clinic Day:  01/19/2024  Referring physician: Milon Cleaves, PA   HISTORY OF PRESENT ILLNESS:  The patient is a 33 y.o. female  with iron deficiency anemia, likely related to her menstrual cycles and recent pregnancies.  Of note, she last received IV iron in October/November 2024.  Since then, she has been taking oral iron.  Since her last visit, the patient has been doing well.  She claims her menstrual cycles have not been as heavy as they have been in the past.  She continues to deny having other overt forms of blood loss.    PHYSICAL EXAM:  Blood pressure (!) 137/90, pulse 66, temperature 98.6 F (37 C), temperature source Oral, resp. rate 20, weight 210 lb 1.6 oz (95.3 kg), SpO2 100%, unknown if currently breastfeeding. Wt Readings from Last 3 Encounters:  01/19/24 210 lb 1.6 oz (95.3 kg)  07/22/23 207 lb 9.6 oz (94.2 kg)  05/16/23 208 lb (94.3 kg)   Body mass index is 30.15 kg/m. Performance status (ECOG): 0 - Asymptomatic Physical Exam Constitutional:      Appearance: Normal appearance. She is not ill-appearing.  HENT:     Mouth/Throat:     Mouth: Mucous membranes are moist.     Pharynx: Oropharynx is clear. No oropharyngeal exudate or posterior oropharyngeal erythema.  Cardiovascular:     Rate and Rhythm: Normal rate and regular rhythm.     Heart sounds: No murmur heard.    No friction rub. No gallop.  Pulmonary:     Effort: Pulmonary effort is normal. No respiratory distress.     Breath sounds: Normal breath sounds. No wheezing, rhonchi or rales.  Abdominal:     General: Bowel sounds are normal. There is no distension.     Palpations: Abdomen is soft. There is no mass.     Tenderness: There is no abdominal tenderness.  Musculoskeletal:        General: No swelling.     Right lower leg: No edema.     Left lower leg: No edema.  Lymphadenopathy:     Cervical: No  cervical adenopathy.     Upper Body:     Right upper body: No supraclavicular or axillary adenopathy.     Left upper body: No supraclavicular or axillary adenopathy.     Lower Body: No right inguinal adenopathy. No left inguinal adenopathy.  Skin:    General: Skin is warm.     Coloration: Skin is not jaundiced.     Findings: No lesion or rash.  Neurological:     General: No focal deficit present.     Mental Status: She is alert and oriented to person, place, and time. Mental status is at baseline.  Psychiatric:        Mood and Affect: Mood normal.        Behavior: Behavior normal.        Thought Content: Thought content normal.    LABS:      Latest Ref Rng & Units 01/19/2024    9:39 AM 07/22/2023    9:57 AM 04/22/2023   12:00 AM  CBC  WBC 4.0 - 10.5 K/uL 5.0  5.7  4.8      Hemoglobin 12.0 - 15.0 g/dL 88.1  86.7  87.2      Hematocrit 36.0 - 46.0 % 36.8  38.0  37      Platelets 150 - 400  K/uL 279  324  302         This result is from an external source.      Latest Ref Rng & Units 12/09/2022    9:22 AM 05/13/2022    8:15 AM 03/23/2022    2:01 PM  CMP  Glucose 70 - 99 mg/dL 96  93  84   BUN 6 - 20 mg/dL 11  12  8    Creatinine 0.57 - 1.00 mg/dL 9.34  9.44  9.53   Sodium 134 - 144 mmol/L 135  137  136   Potassium 3.5 - 5.2 mmol/L 4.8  4.7  4.7   Chloride 96 - 106 mmol/L 102  102  101   CO2 20 - 29 mmol/L 19  23  19    Calcium 8.7 - 10.2 mg/dL 9.1  9.5  9.2   Total Protein 6.0 - 8.5 g/dL 7.1  7.3  7.4   Total Bilirubin 0.0 - 1.2 mg/dL <9.7  <9.7  0.2   Alkaline Phos 44 - 121 IU/L 88  106  121   AST 0 - 40 IU/L 20  27  38   ALT 0 - 32 IU/L 12  20  36     Latest Reference Range & Units 07/22/23 09:57 01/19/24 09:39  Iron 28 - 170 ug/dL 47 45  UIBC ug/dL 724 592  TIBC 749 - 549 ug/dL 677 547 (H)  Saturation Ratios 10.4 - 31.8 % 15 10 (L)  Ferritin 11 - 307 ng/mL 25 5 (L)  (H): Data is abnormally high (L): Data is abnormally low  ASSESSMENT & PLAN:  A 33 y.o. female  with iron deficiency anemia.  Once again, the patient's hemoglobin and iron levels are both falling to where a repeat course of IV iron will be necessary.  I will arrange for this to be given within the next few weeks.  Otherwise, I will see her back in 3 months to reassess her iron and hemoglobin levels to see how well she responded to her repeat course of IV iron.  The patient understands all the plans discussed today and is in agreement with them.  Chere Babson DELENA Kerns, MD

## 2024-01-19 ENCOUNTER — Other Ambulatory Visit: Payer: Self-pay

## 2024-01-19 ENCOUNTER — Inpatient Hospital Stay: Payer: Medicaid Other | Attending: Oncology | Admitting: Oncology

## 2024-01-19 ENCOUNTER — Telehealth: Payer: Self-pay | Admitting: Oncology

## 2024-01-19 ENCOUNTER — Inpatient Hospital Stay: Payer: Medicaid Other

## 2024-01-19 ENCOUNTER — Other Ambulatory Visit: Payer: Self-pay | Admitting: Oncology

## 2024-01-19 VITALS — BP 137/90 | HR 66 | Temp 98.6°F | Resp 20 | Wt 210.1 lb

## 2024-01-19 DIAGNOSIS — D508 Other iron deficiency anemias: Secondary | ICD-10-CM | POA: Diagnosis not present

## 2024-01-19 DIAGNOSIS — D509 Iron deficiency anemia, unspecified: Secondary | ICD-10-CM | POA: Diagnosis present

## 2024-01-19 DIAGNOSIS — D500 Iron deficiency anemia secondary to blood loss (chronic): Secondary | ICD-10-CM

## 2024-01-19 LAB — CBC WITH DIFFERENTIAL (CANCER CENTER ONLY)
Abs Immature Granulocytes: 0 K/uL (ref 0.00–0.07)
Basophils Absolute: 0 K/uL (ref 0.0–0.1)
Basophils Relative: 1 %
Eosinophils Absolute: 0.1 K/uL (ref 0.0–0.5)
Eosinophils Relative: 2 %
HCT: 36.8 % (ref 36.0–46.0)
Hemoglobin: 11.8 g/dL — ABNORMAL LOW (ref 12.0–15.0)
Immature Granulocytes: 0 %
Lymphocytes Relative: 50 %
Lymphs Abs: 2.5 K/uL (ref 0.7–4.0)
MCH: 24.6 pg — ABNORMAL LOW (ref 26.0–34.0)
MCHC: 32.1 g/dL (ref 30.0–36.0)
MCV: 76.8 fL — ABNORMAL LOW (ref 80.0–100.0)
Monocytes Absolute: 0.5 K/uL (ref 0.1–1.0)
Monocytes Relative: 9 %
Neutro Abs: 1.9 K/uL (ref 1.7–7.7)
Neutrophils Relative %: 38 %
Platelet Count: 279 K/uL (ref 150–400)
RBC: 4.79 MIL/uL (ref 3.87–5.11)
RDW: 14.3 % (ref 11.5–15.5)
WBC Count: 5 K/uL (ref 4.0–10.5)
nRBC: 0 % (ref 0.0–0.2)

## 2024-01-19 LAB — IRON AND TIBC
Iron: 45 ug/dL (ref 28–170)
Saturation Ratios: 10 % — ABNORMAL LOW (ref 10.4–31.8)
TIBC: 452 ug/dL — ABNORMAL HIGH (ref 250–450)
UIBC: 407 ug/dL

## 2024-01-19 LAB — FERRITIN: Ferritin: 5 ng/mL — ABNORMAL LOW (ref 11–307)

## 2024-01-19 NOTE — Telephone Encounter (Signed)
 Patient has been scheduled for follow-up visit per 01/18/24 LOS.  Pt given an appt calendar with date and time.

## 2024-01-20 ENCOUNTER — Telehealth: Payer: Self-pay | Admitting: Oncology

## 2024-01-20 ENCOUNTER — Telehealth: Payer: Self-pay

## 2024-01-20 ENCOUNTER — Encounter: Payer: Self-pay | Admitting: Oncology

## 2024-01-20 NOTE — Telephone Encounter (Signed)
 Contacted pt to schedule an appt. Unable to reach via phone, voicemail was left.   Per Dr. Valaria Kerns: let pt know she is iron deficient again and needs to be set up for another course of IV iron. thx

## 2024-01-20 NOTE — Telephone Encounter (Signed)
 Dr Ezzard: let pt know she is iron deficient again and needs to be set up for another course of IV iron. thx

## 2024-01-25 NOTE — Telephone Encounter (Signed)
 Patient has been scheduled. Aware of appt date and time.

## 2024-01-30 ENCOUNTER — Inpatient Hospital Stay

## 2024-01-30 VITALS — BP 109/67 | HR 78 | Temp 97.6°F | Resp 16

## 2024-01-30 DIAGNOSIS — D509 Iron deficiency anemia, unspecified: Secondary | ICD-10-CM | POA: Diagnosis not present

## 2024-01-30 DIAGNOSIS — D508 Other iron deficiency anemias: Secondary | ICD-10-CM

## 2024-01-30 MED ORDER — SODIUM CHLORIDE 0.9 % IV SOLN
510.0000 mg | Freq: Once | INTRAVENOUS | Status: AC
  Administered 2024-01-30: 510 mg via INTRAVENOUS
  Filled 2024-01-30: qty 510

## 2024-01-30 MED ORDER — SODIUM CHLORIDE 0.9 % IV SOLN: INTRAVENOUS | Status: DC

## 2024-01-30 NOTE — Patient Instructions (Signed)

## 2024-02-06 ENCOUNTER — Inpatient Hospital Stay

## 2024-02-06 VITALS — BP 104/65 | HR 72 | Temp 98.5°F | Resp 16

## 2024-02-06 DIAGNOSIS — D509 Iron deficiency anemia, unspecified: Secondary | ICD-10-CM | POA: Diagnosis not present

## 2024-02-06 DIAGNOSIS — D508 Other iron deficiency anemias: Secondary | ICD-10-CM

## 2024-02-06 MED ORDER — SODIUM CHLORIDE 0.9 % IV SOLN
510.0000 mg | Freq: Once | INTRAVENOUS | Status: AC
  Administered 2024-02-06: 510 mg via INTRAVENOUS
  Filled 2024-02-06: qty 510

## 2024-02-06 MED ORDER — SODIUM CHLORIDE 0.9 % IV SOLN
INTRAVENOUS | Status: DC
Start: 2024-02-06 — End: 2024-02-06

## 2024-02-06 NOTE — Patient Instructions (Signed)

## 2024-02-13 ENCOUNTER — Encounter: Payer: Self-pay | Admitting: Physician Assistant

## 2024-02-13 ENCOUNTER — Ambulatory Visit: Admitting: Physician Assistant

## 2024-02-13 VITALS — BP 142/92 | HR 76 | Temp 97.8°F | Ht 70.0 in | Wt 210.0 lb

## 2024-02-13 DIAGNOSIS — I300 Acute nonspecific idiopathic pericarditis: Secondary | ICD-10-CM | POA: Diagnosis not present

## 2024-02-13 LAB — LAB REPORT - SCANNED
EGFR: 122
EGFR: 122

## 2024-02-13 NOTE — Progress Notes (Signed)
 Acute Office Visit  Subjective:    Patient ID: Allison Jones, female    DOB: [birth year], 33 y.o.   MRN: [id number]  Chief Complaint  Patient presents with   Chest pain    HPI: Patient is in today for chest pain  Discussed the use of AI scribe software for clinical note transcription with the patient, who gave verbal consent to proceed.  History of Present Illness   Allison Jones is a 33 year old female who presents with sharp chest pain.  She has been experiencing sharp chest pain since this morning. The pain intensifies with deep breaths, reaching a level of eight, and persists at a level of four to five with normal breathing. No previous episodes of similar pain have been noted. She has not taken any medication to alleviate the pain.  She consumed hot chips and an energy drink the day before but denies experiencing heartburn or burning sensations. Her last cardiology consultation was in 2010, and she has not seen a cardiologist since.  She has a family history of stroke, with her mother and grandmother having experienced strokes. Her last blood work was conducted approximately a year ago, and she has been seeing a Acupuncturist for iron management, which she reports is going well.  No numbness, tingling in her hands, vision changes, or difficulty eating or drinking.       Past Medical History:  Diagnosis Date   Anemia    H/O varicella    Increased BMI 11/20/2009   Wolff-Parkinson-White syndrome    Yeast infection     Past Surgical History:  Procedure Laterality Date   CARDIAC ELECTROPHYSIOLOGY STUDY AND ABLATION     CESAREAN SECTION  02/02/2022   Procedure: CESAREAN SECTION;  Surgeon: Storm Setter, DO;  Location: [facility];  Service: Obstetrics;;   MYRINGOTOMY Bilateral    TONSILLECTOMY      Family History  Problem Relation Age of Onset   Diabetes Mother    Hypertension Mother    Hypertension Father    Diabetes Maternal Aunt    Breast cancer  Maternal Aunt    Diabetes Maternal Aunt    Diabetes Maternal Grandmother    Heart disease Maternal Grandmother     Social History   Socioeconomic History   Marital status: Single    Spouse name: Not on file   Number of children: 2   Years of education: 12   Highest education level: 12th grade  Occupational History   Occupation: UNEMPLOYED  Tobacco Use   Smoking status: Never   Smokeless tobacco: Never  Vaping Use   Vaping status: Never Used  Substance and Sexual Activity   Alcohol use: Not Currently    Alcohol/week: 0.0 standard drinks of alcohol   Drug use: No   Sexual activity: Not Currently    Birth control/protection: None    Comment: 1st intercourse 33 yo-Fewer than 5 partners  Other Topics Concern   Not on file  Social History Narrative   Not on file   Social Drivers of Health   Financial Resource Strain: Low Risk  (12/07/2022)   Overall Financial Resource Strain (CARDIA)    Difficulty of Paying Living Expenses: Not hard at all  Food Insecurity: No Food Insecurity (01/20/2023)   Hunger Vital Sign    Worried About Running Out of Food in the Last Year: Never true    Ran Out of Food in the Last Year: Never true  Transportation Needs: No Transportation Needs (12/07/2022)  PRAPARE - Administrator, Civil Service (Medical): No    Lack of Transportation (Non-Medical): No  Physical Activity: Unknown (12/07/2022)   Exercise Vital Sign    Days of Exercise per Week: 0 days    Minutes of Exercise per Session: Not on file  Recent Concern: Physical Activity - Inactive (12/07/2022)   Exercise Vital Sign    Days of Exercise per Week: 0 days    Minutes of Exercise per Session: 20 min  Stress: Not on file (05/19/2023)  Social Connections: Socially Isolated (12/07/2022)   Social Connection and Isolation Panel    Frequency of Communication with Friends and Family: Once a week    Frequency of Social Gatherings with Friends and Family: Once a week    Attends Religious  Services: 1 to 4 times per year    Active Member of [employer] or Organizations: No    Attends Banker Meetings: Not on file    Marital Status: Never married  Intimate Partner Violence: Not At Risk (01/20/2023)   Humiliation, Afraid, Rape, and Kick questionnaire    Fear of Current or Ex-Partner: No    Emotionally Abused: No    Physically Abused: No    Sexually Abused: No    No outpatient medications prior to visit.   No facility-administered medications prior to visit.    No Known Allergies  Review of Systems  Constitutional:  Negative for appetite change, fatigue and fever.  HENT:  Negative for congestion, ear pain, sinus pressure and sore throat.   Respiratory:  Negative for cough, chest tightness, shortness of breath and wheezing.   Cardiovascular:  Positive for chest pain. Negative for palpitations.  Gastrointestinal:  Negative for abdominal pain, constipation, diarrhea, nausea and vomiting.  Genitourinary:  Negative for dysuria and hematuria.  Musculoskeletal:  Negative for arthralgias, back pain, joint swelling and myalgias.  Skin:  Negative for rash.  Neurological:  Negative for dizziness, weakness and headaches.  Psychiatric/Behavioral:  Negative for dysphoric mood. The patient is not nervous/anxious.        Objective:        02/13/2024   11:00 AM 02/06/2024   10:01 AM 02/06/2024    9:10 AM  Vitals with BMI  Height 5' 10    Weight 210 lbs    BMI 30.13    Systolic 142 104 871  Diastolic 92 65 72  Pulse 76 72 70    Orthostatic VS for the past 72 hrs (Last 3 readings):  Patient Position BP Location  02/13/24 1100 Sitting Left Arm     Physical Exam Vitals reviewed.  Constitutional:      Appearance: Normal appearance.  Cardiovascular:     Rate and Rhythm: Normal rate and regular rhythm.     Heart sounds: Normal heart sounds.  Pulmonary:     Effort: Pulmonary effort is normal.     Breath sounds: Normal breath sounds.  Abdominal:     General:  Bowel sounds are normal.     Palpations: Abdomen is soft.     Tenderness: There is no abdominal tenderness.  Neurological:     Mental Status: She is alert and oriented to person, place, and time.  Psychiatric:        Mood and Affect: Mood normal.        Behavior: Behavior normal.     Health Maintenance Due  Topic Date Due   COVID-19 Vaccine (1) Never done   DTaP/Tdap/Td (1 - Tdap) Never done   Hepatitis  B Vaccines (1 of 3 - 19+ 3-dose series) Never done   HPV VACCINES (1 - 3-dose SCDM series) Never done   Cervical Cancer Screening (HPV/Pap Cotest)  06/21/2021   INFLUENZA VACCINE  02/10/2024       Topic Date Due   Hepatitis B Vaccines (1 of 3 - 19+ 3-dose series) Never done   HPV VACCINES (1 - 3-dose SCDM series) Never done     Lab Results  Component Value Date   TSH 2.000 12/09/2022   Lab Results  Component Value Date   WBC 5.0 01/19/2024   HGB 11.8 (L) 01/19/2024   HCT 36.8 01/19/2024   MCV 76.8 (L) 01/19/2024   PLT 279 01/19/2024   Lab Results  Component Value Date   NA 135 12/09/2022   K 4.8 12/09/2022   CO2 19 (L) 12/09/2022   GLUCOSE 96 12/09/2022   BUN 11 12/09/2022   CREATININE 0.65 12/09/2022   BILITOT <0.2 12/09/2022   ALKPHOS 88 12/09/2022   AST 20 12/09/2022   ALT 12 12/09/2022   PROT 7.1 12/09/2022   ALBUMIN 4.1 12/09/2022   CALCIUM 9.1 12/09/2022   ANIONGAP 8 02/01/2022   EGFR 121 12/09/2022   Lab Results  Component Value Date   CHOL 139 12/09/2022   Lab Results  Component Value Date   HDL 65 12/09/2022   Lab Results  Component Value Date   LDLCALC 62 12/09/2022   Lab Results  Component Value Date   TRIG 53 12/09/2022   Lab Results  Component Value Date   CHOLHDL 2.1 12/09/2022   Lab Results  Component Value Date   HGBA1C 4.9 03/23/2022       Assessment & Plan:  Acute idiopathic pericarditis Assessment & Plan: Chest pain Acute sharp chest pain, non-musculoskeletal. Differential includes pericarditis and myocardial  infarction.  Suspected pericarditis Chest pain suggestive of pericarditis. EKG unremarkable, reducing myocardial infarction likelihood. Inflammation suspected, possibly exacerbated by recent spicy food intake. Elevated blood pressure noted, heart rate stable. - Order stat labs at [facility] outpatient center to check for markers of myocardial infarction. - Advise to start ibuprofen  800 mg every 8 hours for one week if labs confirm pericarditis. - Instruct to wait for lab results before starting ibuprofen  to rule out myocardial infarction. - Plan to call with lab results today.       No orders of the defined types were placed in this encounter.   No orders of the defined types were placed in this encounter.    Follow-up: Return in about 4 weeks (around 03/12/2024) for Chronic, Nola.  An After Visit Summary was printed and given to the patient.   I,Lauren M Auman,acting as a Neurosurgeon for [employer], [employer].,have documented all relevant documentation on the behalf of Nola Angles, PA,as directed by  Nola Angles, PA while in the presence of Nola Angles, [place].    Nola Angles, [place] [facility] [phone]

## 2024-02-13 NOTE — Telephone Encounter (Signed)
 Scheduled patient appointment today at 11. Did advise that in the future not to send a mychart message regarding chest pain, instead to seek help at the ED, UC or to call our office. Patient verbalized understanding.

## 2024-02-13 NOTE — Patient Instructions (Signed)
 VISIT SUMMARY:  Today, you came in with sharp chest pain that started this morning. The pain gets worse when you take deep breaths and is less intense with normal breathing. You have no history of similar pain, and you haven't taken any medication for it. You mentioned eating hot chips and drinking an energy drink yesterday but did not experience heartburn. Your last cardiology visit was in 2010, and you have a family history of stroke. Your recent blood work was about a year ago, and your iron management with a hematologist is going well.  YOUR PLAN:  -CHEST PAIN: Your chest pain is sharp and not related to muscle or bone issues. We are considering pericarditis (inflammation around the heart) and myocardial infarction (heart attack) as possible causes. We will do some urgent lab tests to check for signs of a heart attack. Please wait for the lab results before starting any medication.  -SUSPECTED PERICARDITIS: Pericarditis is inflammation of the lining around the heart. Your symptoms and the unremarkable EKG suggest this condition. If the lab results confirm pericarditis, you should start taking ibuprofen  800 mg every 8 hours for one week. We will call you with the lab results today.  -ELEVATED BLOOD PRESSURE: Your blood pressure is higher than normal, which can be related to pericarditis. We will monitor your blood pressure as part of the overall assessment for pericarditis.  INSTRUCTIONS:  Please go to [facility] outpatient center immediately for the urgent lab tests. Wait for our call with the lab results before starting any medication. If you experience worsening symptoms, such as increased chest pain, shortness of breath, or any other concerning symptoms, seek medical attention immediately.

## 2024-02-13 NOTE — Addendum Note (Signed)
 Addended by: Kynnadi Dicenso M on: 02/13/2024 03:22 PM   Modules accepted: Orders

## 2024-02-13 NOTE — Assessment & Plan Note (Signed)
 Chest pain Acute sharp chest pain, non-musculoskeletal. Differential includes pericarditis and myocardial infarction.  Suspected pericarditis Chest pain suggestive of pericarditis. EKG unremarkable, reducing myocardial infarction likelihood. Inflammation suspected, possibly exacerbated by recent spicy food intake. Elevated blood pressure noted, heart rate stable. - Order stat labs at [facility] outpatient center to check for markers of myocardial infarction. - Advise to start ibuprofen  800 mg every 8 hours for one week if labs confirm pericarditis. - Instruct to wait for lab results before starting ibuprofen  to rule out myocardial infarction. - Plan to call with lab results today.

## 2024-02-16 ENCOUNTER — Ambulatory Visit: Payer: Self-pay | Admitting: Physician Assistant

## 2024-03-13 ENCOUNTER — Ambulatory Visit (INDEPENDENT_AMBULATORY_CARE_PROVIDER_SITE_OTHER): Admitting: Physician Assistant

## 2024-03-13 VITALS — BP 128/72 | HR 70 | Temp 97.4°F | Ht 70.0 in | Wt 207.0 lb

## 2024-03-13 DIAGNOSIS — I300 Acute nonspecific idiopathic pericarditis: Secondary | ICD-10-CM

## 2024-03-13 DIAGNOSIS — R079 Chest pain, unspecified: Secondary | ICD-10-CM

## 2024-03-13 NOTE — Assessment & Plan Note (Signed)
 No current chest pain. Previous pain likely due to acute pericarditis. Differential includes myocardial infarction, less likely due to age. Discussed dietary influences on inflammation. - Continue ibuprofen  as needed for pain. - Advise increased fluid intake. - Return for evaluation if chest pain recurs and persists.

## 2024-03-13 NOTE — Assessment & Plan Note (Signed)
 Controlled Continue to monitor Denies any major changes or new symptoms Continue to take ibuprofen  PRN BP Readings from Last 3 Encounters:  03/13/24 128/72  02/13/24 (!) 142/92  02/06/24 104/65

## 2024-03-13 NOTE — Progress Notes (Signed)
 Acute Office Visit  Subjective:    Patient ID: Allison Jones, female    DOB: 10/11/1990, 33 y.o.   MRN: [id number]  Chief Complaint  Patient presents with   Follow up    HPI: Patient is in today for follow up on pericarditis  Discussed the use of AI scribe software for clinical note transcription with the patient, who gave verbal consent to proceed.  History of Present Illness Allison Jones is a 33 year old female who presents for a follow-up on chest pain.  She has not experienced any recurrence of chest pain since the last visit. There have been no changes in her symptoms, and she continues to manage any discomfort with ibuprofen  as needed.  No associated symptoms such as cough, sinus congestion, or blood in stool.    Past Medical History:  Diagnosis Date   Anemia    H/O varicella    Increased BMI 11/20/2009   Wolff-Parkinson-White syndrome    Yeast infection     Past Surgical History:  Procedure Laterality Date   CARDIAC ELECTROPHYSIOLOGY STUDY AND ABLATION     CESAREAN SECTION  02/02/2022   Procedure: CESAREAN SECTION;  Surgeon: Storm Setter, DO;  Location: [facility] LD ORS;  Service: Obstetrics;;   MYRINGOTOMY Bilateral    TONSILLECTOMY      Family History  Problem Relation Age of Onset   Diabetes Mother    Hypertension Mother    Hypertension Father    Diabetes Maternal Aunt    Breast cancer Maternal Aunt    Diabetes Maternal Aunt    Diabetes Maternal Grandmother    Heart disease Maternal Grandmother     Social History   Socioeconomic History   Marital status: Single    Spouse name: Not on file   Number of children: 2   Years of education: 12   Highest education level: 12th grade  Occupational History   Occupation: UNEMPLOYED  Tobacco Use   Smoking status: Never   Smokeless tobacco: Never  Vaping Use   Vaping status: Never Used  Substance and Sexual Activity   Alcohol use: Not Currently    Alcohol/week: 0.0 standard drinks of alcohol    Drug use: No   Sexual activity: Not Currently    Birth control/protection: None    Comment: 1st intercourse 33 yo-Fewer than 5 partners  Other Topics Concern   Not on file  Social History Narrative   Not on file   Social Drivers of Health   Financial Resource Strain: Low Risk  (03/13/2024)   Overall Financial Resource Strain (CARDIA)    Difficulty of Paying Living Expenses: Not hard at all  Food Insecurity: No Food Insecurity (03/13/2024)   Hunger Vital Sign    Worried About Running Out of Food in the Last Year: Never true    Ran Out of Food in the Last Year: Never true  Transportation Needs: No Transportation Needs (03/13/2024)   PRAPARE - Administrator, Civil Service (Medical): No    Lack of Transportation (Non-Medical): No  Physical Activity: Insufficiently Active (03/13/2024)   Exercise Vital Sign    Days of Exercise per Week: 3 days    Minutes of Exercise per Session: 30 min  Stress: No Stress Concern Present (03/13/2024)   [employer] of Occupational Health - Occupational Stress Questionnaire    Feeling of Stress: Not at all  Social Connections: Moderately Isolated (03/13/2024)   Social Connection and Isolation Panel    Frequency of Communication with  Friends and Family: More than three times a week    Frequency of Social Gatherings with Friends and Family: More than three times a week    Attends Religious Services: 1 to 4 times per year    Active Member of [employer] or Organizations: No    Attends Banker Meetings: Not on file    Marital Status: Never married  Intimate Partner Violence: Not At Risk (01/20/2023)   Humiliation, Afraid, Rape, and Kick questionnaire    Fear of Current or Ex-Partner: No    Emotionally Abused: No    Physically Abused: No    Sexually Abused: No    No outpatient medications prior to visit.   No facility-administered medications prior to visit.    No Known Allergies  Review of Systems  Constitutional:  Negative for  appetite change, fatigue and fever.  HENT:  Negative for congestion, ear pain, sinus pressure and sore throat.   Respiratory:  Negative for cough, chest tightness, shortness of breath and wheezing.   Cardiovascular:  Negative for chest pain and palpitations.  Gastrointestinal:  Negative for abdominal pain, constipation, diarrhea, nausea and vomiting.  Genitourinary:  Negative for dysuria and hematuria.  Musculoskeletal:  Negative for arthralgias, back pain, joint swelling and myalgias.  Skin:  Negative for rash.  Neurological:  Negative for dizziness, weakness and headaches.  Psychiatric/Behavioral:  Negative for dysphoric mood. The patient is not nervous/anxious.        Objective:        03/13/2024   10:13 AM 02/13/2024   11:00 AM 02/06/2024   10:01 AM  Vitals with BMI  Height 5' 10 5' 10   Weight 207 lbs 210 lbs   BMI 29.7 30.13   Systolic 128 142 895  Diastolic 72 92 65  Pulse 70 76 72    Orthostatic VS for the past 72 hrs (Last 3 readings):  Patient Position BP Location  03/13/24 1013 Sitting Left Arm     Physical Exam Vitals reviewed.  Constitutional:      Appearance: Normal appearance.  Neck:     Vascular: No carotid bruit.  Cardiovascular:     Rate and Rhythm: Normal rate and regular rhythm.     Heart sounds: Normal heart sounds.  Pulmonary:     Effort: Pulmonary effort is normal.     Breath sounds: Normal breath sounds.  Abdominal:     General: Bowel sounds are normal.     Palpations: Abdomen is soft.     Tenderness: There is no abdominal tenderness.  Neurological:     Mental Status: She is alert and oriented to person, place, and time.  Psychiatric:        Mood and Affect: Mood normal.        Behavior: Behavior normal.     Health Maintenance Due  Topic Date Due   COVID-19 Vaccine (1) Never done   DTaP/Tdap/Td (1 - Tdap) Never done   Hepatitis B Vaccines 19-59 Average Risk (1 of 3 - 19+ 3-dose series) Never done   HPV VACCINES (1 - 3-dose SCDM  series) Never done   Cervical Cancer Screening (HPV/Pap Cotest)  06/21/2021   INFLUENZA VACCINE  02/10/2024       Topic Date Due   Hepatitis B Vaccines 19-59 Average Risk (1 of 3 - 19+ 3-dose series) Never done   HPV VACCINES (1 - 3-dose SCDM series) Never done     Lab Results  Component Value Date   TSH 2.000 12/09/2022  Lab Results  Component Value Date   WBC 5.0 01/19/2024   HGB 11.8 (L) 01/19/2024   HCT 36.8 01/19/2024   MCV 76.8 (L) 01/19/2024   PLT 279 01/19/2024   Lab Results  Component Value Date   NA 135 12/09/2022   K 4.8 12/09/2022   CO2 19 (L) 12/09/2022   GLUCOSE 96 12/09/2022   BUN 11 12/09/2022   CREATININE 0.65 12/09/2022   BILITOT <0.2 12/09/2022   ALKPHOS 88 12/09/2022   AST 20 12/09/2022   ALT 12 12/09/2022   PROT 7.1 12/09/2022   ALBUMIN 4.1 12/09/2022   CALCIUM 9.1 12/09/2022   ANIONGAP 8 02/01/2022   EGFR 122.0 02/13/2024   EGFR 122.0 02/13/2024   Lab Results  Component Value Date   CHOL 139 12/09/2022   Lab Results  Component Value Date   HDL 65 12/09/2022   Lab Results  Component Value Date   LDLCALC 62 12/09/2022   Lab Results  Component Value Date   TRIG 53 12/09/2022   Lab Results  Component Value Date   CHOLHDL 2.1 12/09/2022   Lab Results  Component Value Date   HGBA1C 4.9 03/23/2022       Assessment & Plan:  Acute idiopathic pericarditis Assessment & Plan: No current chest pain. Previous pain likely due to acute pericarditis. Differential includes myocardial infarction, less likely due to age. Discussed dietary influences on inflammation. - Continue ibuprofen  as needed for pain. - Advise increased fluid intake. - Return for evaluation if chest pain recurs and persists.   Chest pain syndrome Assessment & Plan: Controlled Continue to monitor Denies any major changes or new symptoms Continue to take ibuprofen  PRN BP Readings from Last 3 Encounters:  03/13/24 128/72  02/13/24 (!) 142/92  02/06/24 104/65         No orders of the defined types were placed in this encounter.   No orders of the defined types were placed in this encounter.    Follow-up: Return if symptoms worsen or fail to improve.  An After Visit Summary was printed and given to the patient.    I,Lauren M Auman,acting as a Neurosurgeon for [employer], PA.,have documented all relevant documentation on the behalf of Nola Angles, PA,as directed by  Nola Angles, PA while in the presence of Nola Angles, [place].    Nola Angles, [place] [facility] [phone]

## 2024-04-20 ENCOUNTER — Ambulatory Visit: Admitting: Oncology

## 2024-04-20 ENCOUNTER — Other Ambulatory Visit

## 2024-04-22 NOTE — Progress Notes (Deleted)
 [facility]  [address] [place],  [place]  [address] [phone]  Clinic Day:  01/19/2024  Referring physician: Milon Cleaves, PA   HISTORY OF PRESENT ILLNESS:  The patient is a 33 y.o. female with recurrent iron deficiency anemia, likely related to her menstrual cycles and recent pregnancies.  She comes in today to reassess her iron and hemoglobin levels after receiving IV iron in July 2025.  Since then, she has been taking oral iron.  Since her last visit, the patient has been doing well.  She claims her menstrual cycles have not been as heavy as they have been in the past.  She continues to deny having other overt forms of blood loss.    PHYSICAL EXAM:  unknown if currently breastfeeding. Wt Readings from Last 3 Encounters:  03/13/24 207 lb (93.9 kg)  02/13/24 210 lb (95.3 kg)  01/19/24 210 lb 1.6 oz (95.3 kg)   There is no height or weight on file to calculate BMI. Performance status (ECOG): 0 - Asymptomatic Physical Exam Constitutional:      Appearance: Normal appearance. She is not ill-appearing.  HENT:     Mouth/Throat:     Mouth: Mucous membranes are moist.     Pharynx: Oropharynx is clear. No oropharyngeal exudate or posterior oropharyngeal erythema.  Cardiovascular:     Rate and Rhythm: Normal rate and regular rhythm.     Heart sounds: No murmur heard.    No friction rub. No gallop.  Pulmonary:     Effort: Pulmonary effort is normal. No respiratory distress.     Breath sounds: Normal breath sounds. No wheezing, rhonchi or rales.  Abdominal:     General: Bowel sounds are normal. There is no distension.     Palpations: Abdomen is soft. There is no mass.     Tenderness: There is no abdominal tenderness.  Musculoskeletal:        General: No swelling.     Right lower leg: No edema.     Left lower leg: No edema.  Lymphadenopathy:     Cervical: No cervical adenopathy.     Upper Body:     Right upper body: No supraclavicular or  axillary adenopathy.     Left upper body: No supraclavicular or axillary adenopathy.     Lower Body: No right inguinal adenopathy. No left inguinal adenopathy.  Skin:    General: Skin is warm.     Coloration: Skin is not jaundiced.     Findings: No lesion or rash.  Neurological:     General: No focal deficit present.     Mental Status: She is alert and oriented to person, place, and time. Mental status is at baseline.  Psychiatric:        Mood and Affect: Mood normal.        Behavior: Behavior normal.        Thought Content: Thought content normal.    LABS:      Latest Ref Rng & Units 01/19/2024    9:39 AM 07/22/2023    9:57 AM 04/22/2023   12:00 AM  CBC  WBC 4.0 - 10.5 K/uL 5.0  5.7  4.8      Hemoglobin 12.0 - 15.0 g/dL 88.1  86.7  87.2      Hematocrit 36.0 - 46.0 % 36.8  38.0  37      Platelets 150 - 400 K/uL 279  324  302         This  result is from an external source.      Latest Ref Rng & Units 12/09/2022    9:22 AM 05/13/2022    8:15 AM 03/23/2022    2:01 PM  CMP  Glucose 70 - 99 mg/dL 96  93  84   BUN 6 - 20 mg/dL 11  12  8    Creatinine 0.57 - 1.00 mg/dL 9.34  9.44  9.53   Sodium 134 - 144 mmol/L 135  137  136   Potassium 3.5 - 5.2 mmol/L 4.8  4.7  4.7   Chloride 96 - 106 mmol/L 102  102  101   CO2 20 - 29 mmol/L 19  23  19    Calcium 8.7 - 10.2 mg/dL 9.1  9.5  9.2   Total Protein 6.0 - 8.5 g/dL 7.1  7.3  7.4   Total Bilirubin 0.0 - 1.2 mg/dL <9.7  <9.7  0.2   Alkaline Phos 44 - 121 IU/L 88  106  121   AST 0 - 40 IU/L 20  27  38   ALT 0 - 32 IU/L 12  20  36     Latest Reference Range & Units 07/22/23 09:57 01/19/24 09:39  Iron 28 - 170 ug/dL 47 45  UIBC ug/dL 724 592  TIBC 749 - 549 ug/dL 677 547 (H)  Saturation Ratios 10.4 - 31.8 % 15 10 (L)  Ferritin 11 - 307 ng/mL 25 5 (L)  (H): Data is abnormally high (L): Data is abnormally low  ASSESSMENT & PLAN:  A 33 y.o. female with iron deficiency anemia.  Once again, the patient's hemoglobin and iron levels are  both falling to where a repeat course of IV iron will be necessary.  I will arrange for this to be given within the next few weeks.  Otherwise, I will see her back in 3 months to reassess her iron and hemoglobin levels to see how well she responded to her repeat course of IV iron.  The patient understands all the plans discussed today and is in agreement with them.  Churchill Grimsley DELENA Kerns, MD

## 2024-04-23 ENCOUNTER — Inpatient Hospital Stay: Admitting: Oncology

## 2024-04-23 ENCOUNTER — Inpatient Hospital Stay

## 2024-04-29 NOTE — Progress Notes (Unsigned)
 [facility]  [address] [place],  [place]  [address] [phone]  Clinic Day:  01/19/2024  Referring physician: Milon Cleaves, PA   HISTORY OF PRESENT ILLNESS:  The patient is a 33 y.o. female with recurrent iron deficiency anemia, likely related to her menstrual cycles and recent pregnancies.  She comes in today to reassess her iron and hemoglobin levels after receiving IV iron in July 2025.  Since then, she has been taking oral iron.  Since her last visit, the patient has been doing well.  She claims her menstrual cycles have not been as heavy as they have been in the past.  She continues to deny having other overt forms of blood loss.    PHYSICAL EXAM:  unknown if currently breastfeeding. Wt Readings from Last 3 Encounters:  03/13/24 207 lb (93.9 kg)  02/13/24 210 lb (95.3 kg)  01/19/24 210 lb 1.6 oz (95.3 kg)   There is no height or weight on file to calculate BMI. Performance status (ECOG): 0 - Asymptomatic Physical Exam Constitutional:      Appearance: Normal appearance. She is not ill-appearing.  HENT:     Mouth/Throat:     Mouth: Mucous membranes are moist.     Pharynx: Oropharynx is clear. No oropharyngeal exudate or posterior oropharyngeal erythema.  Cardiovascular:     Rate and Rhythm: Normal rate and regular rhythm.     Heart sounds: No murmur heard.    No friction rub. No gallop.  Pulmonary:     Effort: Pulmonary effort is normal. No respiratory distress.     Breath sounds: Normal breath sounds. No wheezing, rhonchi or rales.  Abdominal:     General: Bowel sounds are normal. There is no distension.     Palpations: Abdomen is soft. There is no mass.     Tenderness: There is no abdominal tenderness.  Musculoskeletal:        General: No swelling.     Right lower leg: No edema.     Left lower leg: No edema.  Lymphadenopathy:     Cervical: No cervical adenopathy.     Upper Body:     Right upper body: No supraclavicular or  axillary adenopathy.     Left upper body: No supraclavicular or axillary adenopathy.     Lower Body: No right inguinal adenopathy. No left inguinal adenopathy.  Skin:    General: Skin is warm.     Coloration: Skin is not jaundiced.     Findings: No lesion or rash.  Neurological:     General: No focal deficit present.     Mental Status: She is alert and oriented to person, place, and time. Mental status is at baseline.  Psychiatric:        Mood and Affect: Mood normal.        Behavior: Behavior normal.        Thought Content: Thought content normal.    LABS:      Latest Ref Rng & Units 01/19/2024    9:39 AM 07/22/2023    9:57 AM 04/22/2023   12:00 AM  CBC  WBC 4.0 - 10.5 K/uL 5.0  5.7  4.8      Hemoglobin 12.0 - 15.0 g/dL 88.1  86.7  87.2      Hematocrit 36.0 - 46.0 % 36.8  38.0  37      Platelets 150 - 400 K/uL 279  324  302         This  result is from an external source.      Latest Ref Rng & Units 12/09/2022    9:22 AM 05/13/2022    8:15 AM 03/23/2022    2:01 PM  CMP  Glucose 70 - 99 mg/dL 96  93  84   BUN 6 - 20 mg/dL 11  12  8    Creatinine 0.57 - 1.00 mg/dL 9.34  9.44  9.53   Sodium 134 - 144 mmol/L 135  137  136   Potassium 3.5 - 5.2 mmol/L 4.8  4.7  4.7   Chloride 96 - 106 mmol/L 102  102  101   CO2 20 - 29 mmol/L 19  23  19    Calcium 8.7 - 10.2 mg/dL 9.1  9.5  9.2   Total Protein 6.0 - 8.5 g/dL 7.1  7.3  7.4   Total Bilirubin 0.0 - 1.2 mg/dL <9.7  <9.7  0.2   Alkaline Phos 44 - 121 IU/L 88  106  121   AST 0 - 40 IU/L 20  27  38   ALT 0 - 32 IU/L 12  20  36     Latest Reference Range & Units 07/22/23 09:57 01/19/24 09:39  Iron 28 - 170 ug/dL 47 45  UIBC ug/dL 724 592  TIBC 749 - 549 ug/dL 677 547 (H)  Saturation Ratios 10.4 - 31.8 % 15 10 (L)  Ferritin 11 - 307 ng/mL 25 5 (L)  (H): Data is abnormally high (L): Data is abnormally low  ASSESSMENT & PLAN:  A 33 y.o. female with iron deficiency anemia.  Once again, the patient's hemoglobin and iron levels are  both falling to where a repeat course of IV iron will be necessary.  I will arrange for this to be given within the next few weeks.  Otherwise, I will see her back in 3 months to reassess her iron and hemoglobin levels to see how well she responded to her repeat course of IV iron.  The patient understands all the plans discussed today and is in agreement with them.  Shadee Montoya DELENA Kerns, MD

## 2024-04-30 ENCOUNTER — Telehealth: Payer: Self-pay | Admitting: Oncology

## 2024-04-30 ENCOUNTER — Inpatient Hospital Stay

## 2024-04-30 ENCOUNTER — Other Ambulatory Visit: Payer: Self-pay | Admitting: Oncology

## 2024-04-30 ENCOUNTER — Inpatient Hospital Stay: Attending: Oncology | Admitting: Oncology

## 2024-04-30 VITALS — BP 133/76 | HR 70 | Temp 98.1°F | Resp 14 | Ht 70.0 in | Wt 211.5 lb

## 2024-04-30 DIAGNOSIS — D509 Iron deficiency anemia, unspecified: Secondary | ICD-10-CM | POA: Diagnosis present

## 2024-04-30 DIAGNOSIS — D500 Iron deficiency anemia secondary to blood loss (chronic): Secondary | ICD-10-CM | POA: Diagnosis not present

## 2024-04-30 DIAGNOSIS — D508 Other iron deficiency anemias: Secondary | ICD-10-CM

## 2024-04-30 LAB — CBC WITH DIFFERENTIAL (CANCER CENTER ONLY)
Abs Immature Granulocytes: 0.01 K/uL (ref 0.00–0.07)
Basophils Absolute: 0 K/uL (ref 0.0–0.1)
Basophils Relative: 1 %
Eosinophils Absolute: 0.1 K/uL (ref 0.0–0.5)
Eosinophils Relative: 1 %
HCT: 37.8 % (ref 36.0–46.0)
Hemoglobin: 12.9 g/dL (ref 12.0–15.0)
Immature Granulocytes: 0 %
Lymphocytes Relative: 58 %
Lymphs Abs: 2.9 K/uL (ref 0.7–4.0)
MCH: 27.7 pg (ref 26.0–34.0)
MCHC: 34.1 g/dL (ref 30.0–36.0)
MCV: 81.1 fL (ref 80.0–100.0)
Monocytes Absolute: 0.4 K/uL (ref 0.1–1.0)
Monocytes Relative: 7 %
Neutro Abs: 1.7 K/uL (ref 1.7–7.7)
Neutrophils Relative %: 33 %
Platelet Count: 343 K/uL (ref 150–400)
RBC: 4.66 MIL/uL (ref 3.87–5.11)
RDW: 16 % — ABNORMAL HIGH (ref 11.5–15.5)
WBC Count: 5.1 K/uL (ref 4.0–10.5)
nRBC: 0 % (ref 0.0–0.2)

## 2024-04-30 LAB — IRON AND TIBC
Iron: 43 ug/dL (ref 28–170)
Saturation Ratios: 12 % (ref 10.4–31.8)
TIBC: 374 ug/dL (ref 250–450)
UIBC: 331 ug/dL

## 2024-04-30 LAB — FERRITIN: Ferritin: 19 ng/mL (ref 11–307)

## 2024-04-30 NOTE — Telephone Encounter (Signed)
 Patient has been scheduled for follow-up visit per 04/27/24 LOS.  Pt aware of scheduled appt details.

## 2024-07-16 ENCOUNTER — Telehealth: Payer: Self-pay

## 2024-07-16 NOTE — Telephone Encounter (Signed)
 Called patient she stated diabetes run in her family and wanted to be tested, made her a fasting appointment with provider on 1/20.   Copied from CRM [id number]. Topic: Clinical - Request for Lab/Test Order >> Jul 16, 2024  9:05 AM Rea ORN wrote: Reason for CRM: Pt would like an order to check A1C.  Please call back to advise, [phone]

## 2024-07-31 ENCOUNTER — Ambulatory Visit: Admitting: Physician Assistant

## 2024-08-07 NOTE — Progress Notes (Unsigned)
 "  Acute Office Visit  Subjective:    Patient ID: Allison Jones, female    DOB: Jan 05, 1991, 34 y.o.   MRN: [id number]  No chief complaint on file.   HPI: Patient is in today for questions on diet and weight management.  Discussed the use of AI scribe software for clinical note transcription with the patient, who gave verbal consent to proceed.  History of Present Illness Allison Jones is a 34 year old female who presents with questions on diet and weight management.  She is interested in resuming phentermine , which she previously tried in September but discontinued due to nausea. No episodes of chest pain or tightness were associated with its use. She is also interested in having her A1c checked and inquires about the frequency of these checks. No excessive thirst or hunger.  She acknowledges slacking on her diet recently, attributing it to the holidays and cold weather, which has limited her outdoor walking. She is exploring dietary options, including protein-rich diets and fasting, and is considering trying Fairlife protein shakes as a meal supplement. She is curious about the benefits of a 21-day fruit and vegetable fast.  Her family history includes hypertension and hyperlipidemia on both sides, and her mother has transitioned from type 2 to type 1 diabetes, while her father has type 2 diabetes. She has a family history of diabetes and is interested in managing her diet.    Past Medical History:  Diagnosis Date   Anemia    H/O varicella    Increased BMI 11/20/2009   Wolff-Parkinson-White syndrome    Yeast infection     Past Surgical History:  Procedure Laterality Date   CARDIAC ELECTROPHYSIOLOGY STUDY AND ABLATION     CESAREAN SECTION  02/02/2022   Procedure: CESAREAN SECTION;  Surgeon: Storm Setter, DO;  Location: [facility] LD ORS;  Service: Obstetrics;;   MYRINGOTOMY Bilateral    TONSILLECTOMY      Family History  Problem Relation Age of Onset   Diabetes Mother     Hypertension Mother    Hypertension Father    Diabetes Maternal Aunt    Breast cancer Maternal Aunt    Diabetes Maternal Aunt    Diabetes Maternal Grandmother    Heart disease Maternal Grandmother     Social History   Socioeconomic History   Marital status: Single    Spouse name: Not on file   Number of children: 2   Years of education: 12   Highest education level: Some college, no degree  Occupational History   Occupation: UNEMPLOYED  Tobacco Use   Smoking status: Never   Smokeless tobacco: Never  Vaping Use   Vaping status: Never Used  Substance and Sexual Activity   Alcohol use: Not Currently    Alcohol/week: 0.0 standard drinks of alcohol   Drug use: No   Sexual activity: Not Currently    Birth control/protection: None    Comment: 1st intercourse 34 yo-Fewer than 5 partners  Other Topics Concern   Not on file  Social History Narrative   Not on file   Social Drivers of Health   Tobacco Use: Low Risk (03/13/2024)   Patient History    Smoking Tobacco Use: Never    Smokeless Tobacco Use: Never    Passive Exposure: Not on file  Financial Resource Strain: Low Risk (07/27/2024)   Overall Financial Resource Strain (CARDIA)    Difficulty of Paying Living Expenses: Not hard at all  Food Insecurity: No Food Insecurity (07/27/2024)  Epic    Worried About Programme Researcher, Broadcasting/film/video in the Last Year: Never true    [employer] of Food in the Last Year: Never true  Transportation Needs: No Transportation Needs (07/27/2024)   Epic    Lack of Transportation (Medical): No    Lack of Transportation (Non-Medical): No  Physical Activity: Inactive (07/27/2024)   Exercise Vital Sign    Days of Exercise per Week: 0 days    Minutes of Exercise per Session: Not on file  Stress: No Stress Concern Present (07/27/2024)   [employer] of Occupational Health - Occupational Stress Questionnaire    Feeling of Stress: Not at all  Social Connections: Socially Isolated (07/27/2024)   Social  Connection and Isolation Panel    Frequency of Communication with Friends and Family: More than three times a week    Frequency of Social Gatherings with Friends and Family: Once a week    Attends Religious Services: Never    Database Administrator or Organizations: No    Attends Engineer, Structural: Not on file    Marital Status: Never married  Intimate Partner Violence: Not At Risk (01/20/2023)   Humiliation, Afraid, Rape, and Kick questionnaire    Fear of Current or Ex-Partner: No    Emotionally Abused: No    Physically Abused: No    Sexually Abused: No  Depression (PHQ2-9): Low Risk (04/30/2024)   Depression (PHQ2-9)    PHQ-2 Score: 0  Alcohol Screen: Low Risk (05/13/2022)   Alcohol Screen    Last Alcohol Screening Score (AUDIT): 0  Housing: Unknown (07/27/2024)   Epic    Unable to Pay for Housing in the Last Year: No    Number of Times Moved in the Last Year: Not on file    Homeless in the Last Year: No  Utilities: Not At Risk (01/20/2023)   [facility] Utilities    Threatened with loss of utilities: No  Health Literacy: Not on file    No outpatient medications prior to visit.   No facility-administered medications prior to visit.    Allergies[1]  Review of Systems  Constitutional:  Negative for appetite change, fatigue and fever.  HENT:  Negative for congestion, ear pain, sinus pressure and sore throat.   Respiratory:  Negative for cough, chest tightness, shortness of breath and wheezing.   Cardiovascular:  Negative for chest pain and palpitations.  Gastrointestinal:  Negative for abdominal pain, constipation, diarrhea, nausea and vomiting.  Genitourinary:  Negative for dysuria and hematuria.  Musculoskeletal:  Negative for arthralgias, back pain, joint swelling and myalgias.  Skin:  Negative for rash.  Neurological:  Negative for dizziness, weakness and headaches.  Psychiatric/Behavioral:  Negative for dysphoric mood. The patient is not nervous/anxious.         Objective:        04/30/2024   11:22 AM 03/13/2024   10:13 AM 02/13/2024   11:00 AM  Vitals with BMI  Height 5' 10 5' 10 5' 10  Weight 211 lbs 8 oz 207 lbs 210 lbs  BMI 30.35 29.7 30.13  Systolic 133 128 857  Diastolic 76 72 92  Pulse 70 70 76    No data found.   Physical Exam Vitals reviewed.  Constitutional:      Appearance: Normal appearance.  Cardiovascular:     Rate and Rhythm: Normal rate and regular rhythm.     Heart sounds: Normal heart sounds.  Pulmonary:     Effort: Pulmonary effort is normal.  Breath sounds: Normal breath sounds.  Abdominal:     General: Bowel sounds are normal.     Palpations: Abdomen is soft.     Tenderness: There is no abdominal tenderness.  Neurological:     Mental Status: She is alert and oriented to person, place, and time.  Psychiatric:        Mood and Affect: Mood normal.        Behavior: Behavior normal.     Health Maintenance Due  Topic Date Due   COVID-19 Vaccine (1) Never done   DTaP/Tdap/Td (1 - Tdap) Never done   Hepatitis B Vaccines 19-59 Average Risk (1 of 3 - 19+ 3-dose series) Never done   Cervical Cancer Screening (HPV/Pap Cotest)  06/21/2021   Influenza Vaccine  02/10/2024       Topic Date Due   Hepatitis B Vaccines 19-59 Average Risk (1 of 3 - 19+ 3-dose series) Never done     Lab Results  Component Value Date   TSH 2.000 12/09/2022   Lab Results  Component Value Date   WBC 5.1 04/30/2024   HGB 12.9 04/30/2024   HCT 37.8 04/30/2024   MCV 81.1 04/30/2024   PLT 343 04/30/2024   Lab Results  Component Value Date   NA 135 12/09/2022   K 4.8 12/09/2022   CO2 19 (L) 12/09/2022   GLUCOSE 96 12/09/2022   BUN 11 12/09/2022   CREATININE 0.65 12/09/2022   BILITOT <0.2 12/09/2022   ALKPHOS 88 12/09/2022   AST 20 12/09/2022   ALT 12 12/09/2022   PROT 7.1 12/09/2022   ALBUMIN 4.1 12/09/2022   CALCIUM 9.1 12/09/2022   ANIONGAP 8 02/01/2022   EGFR 122.0 02/13/2024   EGFR 122.0 02/13/2024   Lab  Results  Component Value Date   CHOL 139 12/09/2022   Lab Results  Component Value Date   HDL 65 12/09/2022   Lab Results  Component Value Date   LDLCALC 62 12/09/2022   Lab Results  Component Value Date   TRIG 53 12/09/2022   Lab Results  Component Value Date   CHOLHDL 2.1 12/09/2022   Lab Results  Component Value Date   HGBA1C 4.9 03/23/2022        Results for orders placed or performed in visit on 04/30/24  Iron and TIBC   Collection Time: 04/30/24 10:41 AM  Result Value Ref Range   Iron 43 28 - 170 ug/dL   TIBC 625 749 - 549 ug/dL   Saturation Ratios 12 10.4 - 31.8 %   UIBC 331 ug/dL  Ferritin   Collection Time: 04/30/24 10:41 AM  Result Value Ref Range   Ferritin 19 11 - 307 ng/mL  CBC with Differential (Cancer Center Only)   Collection Time: 04/30/24 10:41 AM  Result Value Ref Range   WBC Count 5.1 4.0 - 10.5 K/uL   RBC 4.66 3.87 - 5.11 MIL/uL   Hemoglobin 12.9 12.0 - 15.0 g/dL   HCT 62.1 63.9 - 53.9 %   MCV 81.1 80.0 - 100.0 fL   MCH 27.7 26.0 - 34.0 pg   MCHC 34.1 30.0 - 36.0 g/dL   RDW 83.9 (H) 88.4 - 84.4 %   Platelet Count 343 150 - 400 K/uL   nRBC 0.0 0.0 - 0.2 %   Neutrophils Relative % 33 %   Neutro Abs 1.7 1.7 - 7.7 K/uL   Lymphocytes Relative 58 %   Lymphs Abs 2.9 0.7 - 4.0 K/uL   Monocytes Relative 7 %  Monocytes Absolute 0.4 0.1 - 1.0 K/uL   Eosinophils Relative 1 %   Eosinophils Absolute 0.1 0.0 - 0.5 K/uL   Basophils Relative 1 %   Basophils Absolute 0.0 0.0 - 0.1 K/uL   Immature Granulocytes 0 %   Abs Immature Granulocytes 0.01 0.00 - 0.07 K/uL     Assessment & Plan:   Assessment & Plan Weight gain, abnormal Obesity and weight management Previous phentermine  trial stopped due to nausea. Discussed dietary changes, intermittent fasting, and potential use of Ozempic pending lab results. - Ordered labs to assess current blood sugar levels. - Consider restarting phentermine  if labs are normal. - Consider injectable medications  if blood sugar levels are high. - Recommended protein-rich diet and protein shakes like Fairlife. - Advised B12 supplementation during fasting period. Orders:   Comprehensive metabolic panel with GFR   Hemoglobin A1c   Lipid panel   T4, free   TSH  Iron deficiency anemia, unspecified iron deficiency anemia type Controlled  Continue to follow up with Hematology Denies any new or worsening fatigue Orders:   CBC with Differential/Platelet   Diabetes screening Family history of diabetes. Discussed monitoring blood sugar and potential prediabetes diagnosis. Explained lifestyle changes can reverse prediabetes. - Ordered A1c test. - Monitor blood sugar levels and adjust treatment based on results. - Educated on dietary modifications to lower A1c, focusing on reducing carbohydrates and sugars.  There is no height or weight on file to calculate BMI..  No orders of the defined types were placed in this encounter.  No orders of the defined types were placed in this encounter.    Follow-up: No follow-ups on file.  An After Visit Summary was printed and given to the patient.    I,Lauren M Auman,acting as a neurosurgeon for [employer], [employer].,have documented all relevant documentation on the behalf of Nola Angles, PA,as directed by  Nola Angles, PA while in the presence of Nola Angles, [place].   Nola Angles, [place] [facility] [phone]     [1] No Known Allergies  "

## 2024-08-08 ENCOUNTER — Ambulatory Visit (INDEPENDENT_AMBULATORY_CARE_PROVIDER_SITE_OTHER): Admitting: Physician Assistant

## 2024-08-08 VITALS — BP 112/80 | HR 81 | Temp 97.5°F | Resp 16 | Ht 70.0 in | Wt 221.0 lb

## 2024-08-08 DIAGNOSIS — R635 Abnormal weight gain: Secondary | ICD-10-CM

## 2024-08-08 DIAGNOSIS — D509 Iron deficiency anemia, unspecified: Secondary | ICD-10-CM

## 2024-08-08 LAB — CBC WITH DIFFERENTIAL/PLATELET
Basophils Absolute: 0 10*3/uL (ref 0.0–0.2)
Basos: 1 %
EOS (ABSOLUTE): 0.1 10*3/uL (ref 0.0–0.4)
Eos: 1 %
Hematocrit: 36.2 % (ref 34.0–46.6)
Hemoglobin: 11.1 g/dL (ref 11.1–15.9)
Immature Grans (Abs): 0 10*3/uL (ref 0.0–0.1)
Immature Granulocytes: 0 %
Lymphocytes Absolute: 2.8 10*3/uL (ref 0.7–3.1)
Lymphs: 45 %
MCH: 23.1 pg — ABNORMAL LOW (ref 26.6–33.0)
MCHC: 30.7 g/dL — ABNORMAL LOW (ref 31.5–35.7)
MCV: 75 fL — ABNORMAL LOW (ref 79–97)
Monocytes Absolute: 0.5 10*3/uL (ref 0.1–0.9)
Monocytes: 9 %
Neutrophils Absolute: 2.6 10*3/uL (ref 1.4–7.0)
Neutrophils: 44 %
Platelets: 320 10*3/uL (ref 150–450)
RBC: 4.81 x10E6/uL (ref 3.77–5.28)
RDW: 14.6 % (ref 11.7–15.4)
WBC: 6 10*3/uL (ref 3.4–10.8)

## 2024-08-08 LAB — COMPREHENSIVE METABOLIC PANEL WITH GFR
ALT: 14 [IU]/L (ref 0–32)
AST: 25 [IU]/L (ref 0–40)
Albumin: 4.2 g/dL (ref 3.9–4.9)
Alkaline Phosphatase: 87 [IU]/L (ref 41–116)
BUN/Creatinine Ratio: 11 (ref 9–23)
BUN: 6 mg/dL (ref 6–20)
Bilirubin Total: 0.3 mg/dL (ref 0.0–1.2)
CO2: 20 mmol/L (ref 20–29)
Calcium: 9 mg/dL (ref 8.7–10.2)
Chloride: 102 mmol/L (ref 96–106)
Creatinine, Ser: 0.56 mg/dL — ABNORMAL LOW (ref 0.57–1.00)
Globulin, Total: 2.8 g/dL (ref 1.5–4.5)
Glucose: 85 mg/dL (ref 70–99)
Potassium: 4.3 mmol/L (ref 3.5–5.2)
Sodium: 134 mmol/L (ref 134–144)
Total Protein: 7 g/dL (ref 6.0–8.5)
eGFR: 124 mL/min/{1.73_m2}

## 2024-08-08 LAB — HEMOGLOBIN A1C
Est. average glucose Bld gHb Est-mCnc: 105 mg/dL
Hgb A1c MFr Bld: 5.3 % (ref 4.8–5.6)

## 2024-08-08 LAB — LIPID PANEL
Chol/HDL Ratio: 2.4 ratio (ref 0.0–4.4)
Cholesterol, Total: 175 mg/dL (ref 100–199)
HDL: 73 mg/dL
LDL Chol Calc (NIH): 90 mg/dL (ref 0–99)
Triglycerides: 63 mg/dL (ref 0–149)
VLDL Cholesterol Cal: 12 mg/dL (ref 5–40)

## 2024-08-08 LAB — TSH: TSH: 2.56 u[IU]/mL (ref 0.450–4.500)

## 2024-08-08 LAB — T4, FREE: Free T4: 1.06 ng/dL (ref 0.82–1.77)

## 2024-08-08 NOTE — Assessment & Plan Note (Signed)
 Controlled  Continue to follow up with Hematology Denies any new or worsening fatigue Orders:   CBC with Differential/Platelet

## 2024-08-08 NOTE — Assessment & Plan Note (Signed)
 Obesity and weight management Previous phentermine  trial stopped due to nausea. Discussed dietary changes, intermittent fasting, and potential use of Ozempic pending lab results. - Ordered labs to assess current blood sugar levels. - Consider restarting phentermine  if labs are normal. - Consider injectable medications if blood sugar levels are high. - Recommended protein-rich diet and protein shakes like Fairlife. - Advised B12 supplementation during fasting period. Orders:   Comprehensive metabolic panel with GFR   Hemoglobin A1c   Lipid panel   T4, free   TSH

## 2024-08-10 ENCOUNTER — Ambulatory Visit: Payer: Self-pay | Admitting: Physician Assistant

## 2024-08-10 DIAGNOSIS — E66811 Obesity, class 1: Secondary | ICD-10-CM

## 2024-08-10 MED ORDER — MODAFINIL 100 MG PO TABS: 100.0000 mg | ORAL_TABLET | Freq: Every day | ORAL | 0 refills | Status: AC

## 2024-08-13 ENCOUNTER — Telehealth: Payer: Self-pay

## 2024-08-13 ENCOUNTER — Other Ambulatory Visit (HOSPITAL_COMMUNITY): Payer: Self-pay

## 2024-08-13 ENCOUNTER — Encounter: Payer: Self-pay | Admitting: Oncology

## 2024-09-26 ENCOUNTER — Ambulatory Visit: Admitting: Physician Assistant

## 2024-10-29 ENCOUNTER — Inpatient Hospital Stay: Admitting: Oncology

## 2024-10-29 ENCOUNTER — Inpatient Hospital Stay
